# Patient Record
Sex: Male | Born: 1950 | Race: White | Hispanic: No | Marital: Single | Smoking: Former smoker
Health system: Southern US, Community
[De-identification: ages and names within clinical notes are randomized; demographics above are authoritative.]

## PROBLEM LIST (undated history)

## (undated) DIAGNOSIS — I10 Essential (primary) hypertension: Secondary | ICD-10-CM

## (undated) DIAGNOSIS — G8191 Hemiplegia, unspecified affecting right dominant side: Secondary | ICD-10-CM

## (undated) DIAGNOSIS — I619 Nontraumatic intracerebral hemorrhage, unspecified: Secondary | ICD-10-CM

## (undated) DIAGNOSIS — Z789 Other specified health status: Secondary | ICD-10-CM

## (undated) DIAGNOSIS — I69398 Other sequelae of cerebral infarction: Secondary | ICD-10-CM

## (undated) HISTORY — PX: NO PAST SURGERIES: SHX2092

---

## 2021-06-04 ENCOUNTER — Inpatient Hospital Stay (HOSPITAL_COMMUNITY): Payer: Medicare Other

## 2021-06-04 ENCOUNTER — Encounter (HOSPITAL_COMMUNITY): Payer: Self-pay | Admitting: Neurology

## 2021-06-04 ENCOUNTER — Emergency Department (HOSPITAL_COMMUNITY): Payer: Medicare Other

## 2021-06-04 ENCOUNTER — Other Ambulatory Visit: Payer: Self-pay

## 2021-06-04 ENCOUNTER — Inpatient Hospital Stay (HOSPITAL_COMMUNITY)
Admission: EM | Admit: 2021-06-04 | Discharge: 2021-06-18 | DRG: 064 | Disposition: A | Discharge status: Skilled Nursing Facility | Payer: Medicare Other | Attending: Internal Medicine | Admitting: Internal Medicine

## 2021-06-04 DIAGNOSIS — I611 Nontraumatic intracerebral hemorrhage in hemisphere, cortical: Principal | ICD-10-CM | POA: Diagnosis present

## 2021-06-04 DIAGNOSIS — E870 Hyperosmolality and hypernatremia: Secondary | ICD-10-CM | POA: Diagnosis not present

## 2021-06-04 DIAGNOSIS — B370 Candidal stomatitis: Secondary | ICD-10-CM | POA: Diagnosis not present

## 2021-06-04 DIAGNOSIS — J690 Pneumonitis due to inhalation of food and vomit: Secondary | ICD-10-CM | POA: Diagnosis present

## 2021-06-04 DIAGNOSIS — Z8249 Family history of ischemic heart disease and other diseases of the circulatory system: Secondary | ICD-10-CM

## 2021-06-04 DIAGNOSIS — N179 Acute kidney failure, unspecified: Secondary | ICD-10-CM | POA: Diagnosis present

## 2021-06-04 DIAGNOSIS — I161 Hypertensive emergency: Secondary | ICD-10-CM

## 2021-06-04 DIAGNOSIS — R4701 Aphasia: Secondary | ICD-10-CM | POA: Diagnosis present

## 2021-06-04 DIAGNOSIS — G8191 Hemiplegia, unspecified affecting right dominant side: Secondary | ICD-10-CM | POA: Diagnosis not present

## 2021-06-04 DIAGNOSIS — R1310 Dysphagia, unspecified: Secondary | ICD-10-CM | POA: Diagnosis present

## 2021-06-04 DIAGNOSIS — Z781 Physical restraint status: Secondary | ICD-10-CM

## 2021-06-04 DIAGNOSIS — R471 Dysarthria and anarthria: Secondary | ICD-10-CM | POA: Diagnosis present

## 2021-06-04 DIAGNOSIS — Z515 Encounter for palliative care: Secondary | ICD-10-CM | POA: Diagnosis not present

## 2021-06-04 DIAGNOSIS — E876 Hypokalemia: Secondary | ICD-10-CM | POA: Diagnosis not present

## 2021-06-04 DIAGNOSIS — I4892 Unspecified atrial flutter: Secondary | ICD-10-CM | POA: Diagnosis not present

## 2021-06-04 DIAGNOSIS — R202 Paresthesia of skin: Secondary | ICD-10-CM | POA: Diagnosis present

## 2021-06-04 DIAGNOSIS — I6389 Other cerebral infarction: Secondary | ICD-10-CM

## 2021-06-04 DIAGNOSIS — K5730 Diverticulosis of large intestine without perforation or abscess without bleeding: Secondary | ICD-10-CM | POA: Diagnosis present

## 2021-06-04 DIAGNOSIS — G935 Compression of brain: Secondary | ICD-10-CM | POA: Diagnosis present

## 2021-06-04 DIAGNOSIS — E43 Unspecified severe protein-calorie malnutrition: Secondary | ICD-10-CM | POA: Insufficient documentation

## 2021-06-04 DIAGNOSIS — Z8679 Personal history of other diseases of the circulatory system: Secondary | ICD-10-CM | POA: Diagnosis present

## 2021-06-04 DIAGNOSIS — I480 Paroxysmal atrial fibrillation: Secondary | ICD-10-CM

## 2021-06-04 DIAGNOSIS — I610 Nontraumatic intracerebral hemorrhage in hemisphere, subcortical: Secondary | ICD-10-CM | POA: Diagnosis not present

## 2021-06-04 DIAGNOSIS — I69391 Dysphagia following cerebral infarction: Secondary | ICD-10-CM | POA: Diagnosis not present

## 2021-06-04 DIAGNOSIS — I609 Nontraumatic subarachnoid hemorrhage, unspecified: Secondary | ICD-10-CM | POA: Diagnosis present

## 2021-06-04 DIAGNOSIS — K4090 Unilateral inguinal hernia, without obstruction or gangrene, not specified as recurrent: Secondary | ICD-10-CM | POA: Diagnosis present

## 2021-06-04 DIAGNOSIS — I619 Nontraumatic intracerebral hemorrhage, unspecified: Secondary | ICD-10-CM | POA: Diagnosis present

## 2021-06-04 DIAGNOSIS — Z7189 Other specified counseling: Secondary | ICD-10-CM | POA: Diagnosis not present

## 2021-06-04 DIAGNOSIS — I471 Supraventricular tachycardia: Secondary | ICD-10-CM | POA: Diagnosis not present

## 2021-06-04 DIAGNOSIS — I615 Nontraumatic intracerebral hemorrhage, intraventricular: Secondary | ICD-10-CM | POA: Diagnosis present

## 2021-06-04 DIAGNOSIS — I10 Essential (primary) hypertension: Secondary | ICD-10-CM | POA: Diagnosis present

## 2021-06-04 DIAGNOSIS — R29717 NIHSS score 17: Secondary | ICD-10-CM | POA: Diagnosis present

## 2021-06-04 DIAGNOSIS — R451 Restlessness and agitation: Secondary | ICD-10-CM | POA: Diagnosis present

## 2021-06-04 DIAGNOSIS — R200 Anesthesia of skin: Secondary | ICD-10-CM | POA: Diagnosis present

## 2021-06-04 DIAGNOSIS — L22 Diaper dermatitis: Secondary | ICD-10-CM | POA: Diagnosis not present

## 2021-06-04 DIAGNOSIS — E785 Hyperlipidemia, unspecified: Secondary | ICD-10-CM | POA: Diagnosis present

## 2021-06-04 DIAGNOSIS — R627 Adult failure to thrive: Secondary | ICD-10-CM | POA: Diagnosis not present

## 2021-06-04 DIAGNOSIS — L989 Disorder of the skin and subcutaneous tissue, unspecified: Secondary | ICD-10-CM | POA: Diagnosis not present

## 2021-06-04 DIAGNOSIS — I483 Typical atrial flutter: Secondary | ICD-10-CM | POA: Diagnosis not present

## 2021-06-04 DIAGNOSIS — G936 Cerebral edema: Secondary | ICD-10-CM | POA: Diagnosis present

## 2021-06-04 DIAGNOSIS — I4891 Unspecified atrial fibrillation: Secondary | ICD-10-CM | POA: Diagnosis not present

## 2021-06-04 HISTORY — DX: Other specified health status: Z78.9

## 2021-06-04 LAB — SODIUM
Sodium: 136 mmol/L (ref 135–145)
Sodium: 139 mmol/L (ref 135–145)

## 2021-06-04 LAB — APTT: aPTT: 26 seconds (ref 24–36)

## 2021-06-04 LAB — COMPREHENSIVE METABOLIC PANEL
ALT: 13 U/L (ref 0–44)
AST: 19 U/L (ref 15–41)
Albumin: 3.6 g/dL (ref 3.5–5.0)
Alkaline Phosphatase: 56 U/L (ref 38–126)
Anion gap: 7 (ref 5–15)
BUN: 13 mg/dL (ref 8–23)
CO2: 22 mmol/L (ref 22–32)
Calcium: 8.6 mg/dL — ABNORMAL LOW (ref 8.9–10.3)
Chloride: 110 mmol/L (ref 98–111)
Creatinine, Ser: 1 mg/dL (ref 0.61–1.24)
GFR, Estimated: >60 mL/min (ref >60)
Glucose, Bld: 120 mg/dL — ABNORMAL HIGH (ref 70–99)
Potassium: 3.8 mmol/L (ref 3.5–5.1)
Sodium: 139 mmol/L (ref 135–145)
Total Bilirubin: 0.5 mg/dL (ref 0.3–1.2)
Total Protein: 6.4 g/dL — ABNORMAL LOW (ref 6.5–8.1)

## 2021-06-04 LAB — I-STAT CHEM 8, ED
BUN: 14 mg/dL (ref 8–23)
Calcium, Ion: 1.09 mmol/L — ABNORMAL LOW (ref 1.15–1.40)
Chloride: 106 mmol/L (ref 98–111)
Creatinine, Ser: 0.9 mg/dL (ref 0.61–1.24)
Glucose, Bld: 120 mg/dL — ABNORMAL HIGH (ref 70–99)
HCT: 38 % — ABNORMAL LOW (ref 39.0–52.0)
Hemoglobin: 12.9 g/dL — ABNORMAL LOW (ref 13.0–17.0)
Potassium: 3.9 mmol/L (ref 3.5–5.1)
Sodium: 138 mmol/L (ref 135–145)
TCO2: 22 mmol/L (ref 22–32)

## 2021-06-04 LAB — CBC
HCT: 37 % — ABNORMAL LOW (ref 39.0–52.0)
Hemoglobin: 13 g/dL (ref 13.0–17.0)
MCH: 31.8 pg (ref 26.0–34.0)
MCHC: 35.1 g/dL (ref 30.0–36.0)
MCV: 90.5 fL (ref 80.0–100.0)
Platelets: 283 10*3/uL (ref 150–400)
RBC: 4.09 MIL/uL — ABNORMAL LOW (ref 4.22–5.81)
RDW: 13.4 % (ref 11.5–15.5)
WBC: 17.6 10*3/uL — ABNORMAL HIGH (ref 4.0–10.5)
nRBC: 0 % (ref 0.0–0.2)

## 2021-06-04 LAB — ECHOCARDIOGRAM COMPLETE
AR max vel: 2.46 cm2
AV Peak grad: 8.8 mmHg
Ao pk vel: 1.48 m/s
Area-P 1/2: 4.21 cm2
Calc EF: 62.3 %
P 1/2 time: 596 msec
S' Lateral: 2.8 cm
Single Plane A2C EF: 62.7 %
Single Plane A4C EF: 62.6 %
Weight: 2313.95 oz

## 2021-06-04 LAB — DIFFERENTIAL
Abs Immature Granulocytes: 0.06 10*3/uL (ref 0.00–0.07)
Basophils Absolute: 0.1 10*3/uL (ref 0.0–0.1)
Basophils Relative: 0 %
Eosinophils Absolute: 0.7 10*3/uL — ABNORMAL HIGH (ref 0.0–0.5)
Eosinophils Relative: 4 %
Immature Granulocytes: 0 %
Lymphocytes Relative: 9 %
Lymphs Abs: 1.6 10*3/uL (ref 0.7–4.0)
Monocytes Absolute: 0.9 10*3/uL (ref 0.1–1.0)
Monocytes Relative: 5 %
Neutro Abs: 14.3 10*3/uL — ABNORMAL HIGH (ref 1.7–7.7)
Neutrophils Relative %: 82 %

## 2021-06-04 LAB — LIPID PANEL
Cholesterol: 165 mg/dL (ref 0–200)
HDL: 38 mg/dL — ABNORMAL LOW (ref >40)
LDL Cholesterol: 118 mg/dL — ABNORMAL HIGH (ref 0–99)
Total CHOL/HDL Ratio: 4.3 RATIO
Triglycerides: 46 mg/dL (ref <150)
VLDL: 9 mg/dL (ref 0–40)

## 2021-06-04 LAB — PROTIME-INR
INR: 1 (ref 0.8–1.2)
Prothrombin Time: 13.6 seconds (ref 11.4–15.2)

## 2021-06-04 LAB — HEMOGLOBIN A1C
Hgb A1c MFr Bld: 5.6 % (ref 4.8–5.6)
Mean Plasma Glucose: 114.02 mg/dL

## 2021-06-04 LAB — CBG MONITORING, ED: Glucose-Capillary: 102 mg/dL — ABNORMAL HIGH (ref 70–99)

## 2021-06-04 MED ORDER — LEVETIRACETAM IN NACL 1500 MG/100ML IV SOLN
1500.0000 mg | Freq: Once | INTRAVENOUS | Status: AC
  Administered 2021-06-04: 1500 mg via INTRAVENOUS
  Filled 2021-06-04: qty 100

## 2021-06-04 MED ORDER — CHLORHEXIDINE GLUCONATE CLOTH 2 % EX PADS
6.0000 | MEDICATED_PAD | Freq: Every day | CUTANEOUS | Status: DC
  Administered 2021-06-05 – 2021-06-17 (×12): 6 via TOPICAL

## 2021-06-04 MED ORDER — LORAZEPAM 2 MG/ML IJ SOLN
0.5000 mg | Freq: Once | INTRAMUSCULAR | Status: AC
  Administered 2021-06-04: 0.5 mg via INTRAVENOUS
  Filled 2021-06-04: qty 1

## 2021-06-04 MED ORDER — CLEVIDIPINE BUTYRATE 0.5 MG/ML IV EMUL
0.0000 mg/h | INTRAVENOUS | Status: DC
  Administered 2021-06-04: 2 mg/h via INTRAVENOUS
  Administered 2021-06-05: 6 mg/h via INTRAVENOUS
  Administered 2021-06-05: 4 mg/h via INTRAVENOUS
  Administered 2021-06-05: 11 mg/h via INTRAVENOUS
  Administered 2021-06-05: 10 mg/h via INTRAVENOUS
  Administered 2021-06-06: 1 mg/h via INTRAVENOUS
  Administered 2021-06-07: 8 mg/h via INTRAVENOUS
  Administered 2021-06-07: 6 mg/h via INTRAVENOUS
  Filled 2021-06-04 (×9): qty 100

## 2021-06-04 MED ORDER — SENNOSIDES-DOCUSATE SODIUM 8.6-50 MG PO TABS
1.0000 | ORAL_TABLET | Freq: Two times a day (BID) | ORAL | Status: DC
  Administered 2021-06-06 – 2021-06-07 (×2): 1 via ORAL
  Filled 2021-06-04 (×3): qty 1

## 2021-06-04 MED ORDER — PANTOPRAZOLE SODIUM 40 MG IV SOLR
40.0000 mg | Freq: Every day | INTRAVENOUS | Status: DC
  Administered 2021-06-04 – 2021-06-07 (×5): 40 mg via INTRAVENOUS
  Filled 2021-06-04 (×5): qty 10

## 2021-06-04 MED ORDER — SODIUM CHLORIDE 3 % IV SOLN
INTRAVENOUS | Status: DC
  Filled 2021-06-04 (×7): qty 500

## 2021-06-04 MED ORDER — PERFLUTREN LIPID MICROSPHERE
1.0000 mL | INTRAVENOUS | Status: AC | PRN
  Administered 2021-06-04: 2 mL via INTRAVENOUS

## 2021-06-04 MED ORDER — GADOBUTROL 1 MMOL/ML IV SOLN
7.0000 mL | Freq: Once | INTRAVENOUS | Status: AC | PRN
  Administered 2021-06-04: 7 mL via INTRAVENOUS

## 2021-06-04 MED ORDER — LABETALOL HCL 5 MG/ML IV SOLN
10.0000 mg | INTRAVENOUS | Status: DC | PRN
  Administered 2021-06-05: 10 mg via INTRAVENOUS
  Administered 2021-06-05: 20 mg via INTRAVENOUS
  Filled 2021-06-04 (×2): qty 4

## 2021-06-04 MED ORDER — CLEVIDIPINE BUTYRATE 0.5 MG/ML IV EMUL
0.0000 mg/h | INTRAVENOUS | Status: AC
  Administered 2021-06-04: 6 mg/h via INTRAVENOUS
  Administered 2021-06-04: 2 mg/h via INTRAVENOUS
  Administered 2021-06-04: 8 mg/h via INTRAVENOUS
  Filled 2021-06-04 (×3): qty 50

## 2021-06-04 MED ORDER — ACETAMINOPHEN 160 MG/5ML PO SOLN
650.0000 mg | ORAL | Status: DC | PRN
  Administered 2021-06-09 – 2021-06-13 (×7): 650 mg
  Filled 2021-06-04 (×7): qty 20.3

## 2021-06-04 MED ORDER — LABETALOL HCL 5 MG/ML IV SOLN
20.0000 mg | Freq: Once | INTRAVENOUS | Status: AC
  Administered 2021-06-04: 20 mg via INTRAVENOUS

## 2021-06-04 MED ORDER — ACETAMINOPHEN 650 MG RE SUPP: 650.0000 mg | RECTAL | Status: DC | PRN

## 2021-06-04 MED ORDER — HYDRALAZINE HCL 20 MG/ML IJ SOLN
10.0000 mg | INTRAMUSCULAR | Status: DC | PRN
  Administered 2021-06-06 – 2021-06-08 (×3): 10 mg via INTRAVENOUS
  Filled 2021-06-04 (×3): qty 1

## 2021-06-04 MED ORDER — FOOD THICKENER (SIMPLYTHICK)
10.0000 | ORAL | Status: DC | PRN
  Filled 2021-06-04: qty 10

## 2021-06-04 MED ORDER — SODIUM CHLORIDE 0.9% FLUSH
3.0000 mL | Freq: Once | INTRAVENOUS | Status: AC
  Administered 2021-06-04: 3 mL via INTRAVENOUS

## 2021-06-04 MED ORDER — STROKE: EARLY STAGES OF RECOVERY BOOK
Freq: Once | Status: AC
  Filled 2021-06-04: qty 1

## 2021-06-04 MED ORDER — ROSUVASTATIN CALCIUM 20 MG PO TABS: 20.0000 mg | ORAL_TABLET | Freq: Every day | ORAL | Status: DC

## 2021-06-04 MED ORDER — ACETAMINOPHEN 325 MG PO TABS
650.0000 mg | ORAL_TABLET | ORAL | Status: DC | PRN
  Administered 2021-06-06: 650 mg via ORAL
  Filled 2021-06-04: qty 2

## 2021-06-04 NOTE — Progress Notes (Signed)
?  Transition of Care (TOC) Screening Note ? ? ?Patient Details  ?Name: Louis Moore ?Date of Birth: [birth year] ? ? ?Transition of Care [facility]) CM/SW Contact:    ?Glennon Mac, RN ?Phone Number: ?06/04/2021, 12:31 PM ? ? ? ?Transition of Care Department [facility]) has reviewed patient and no TOC needs have been identified at this time. We will continue to monitor patient advancement through interdisciplinary progression rounds. If new patient transition needs arise, please place a TOC consult. ? ?Quintella Baton, RN, BSN  ?Trauma/Neuro ICU Case Manager ?[phone]  ?

## 2021-06-04 NOTE — ED Notes (Signed)
Attempted report x 3.  

## 2021-06-04 NOTE — ED Notes (Signed)
Attempted to call report x2

## 2021-06-04 NOTE — TOC CAGE-AID Note (Signed)
Transition of Care (TOC) - CAGE-AID Screening ? ? ?Patient Details  ?Name: Pranshu Lyster ?MRN: [id number] ?Date of Birth: 1950-08-21 ? ?Transition of Care (TOC) CM/SW Contact:    ?Criss Bartles C Tarpley-Carter, LCSWA ?Phone Number: ?06/04/2021, 1:55 PM ? ? ?Clinical Narrative: ?Pt participated in Cage-Aid.  Pt stated he does not use substance or ETOH.  Pt was not offered resources, due to no usage of substance or ETOH.    ? ?Insurance underwriter, MSW, LCSW-A ?Pronouns:  She/Her/Hers ?[facility] ?Clinical Social Worker ?Direct Number:  [phone] ?[email or web address] ? ?CAGE-AID Screening: ?  ? ?Have You Ever Felt You Ought to Cut Down on Your Drinking or Drug Use?: No ?Have People Annoyed You By Critizing Your Drinking Or Drug Use?: No ?Have You Felt Bad Or Guilty About Your Drinking Or Drug Use?: No ?Have You Ever Had a Drink or Used Drugs First Thing In The Morning to Steady Your Nerves or to Get Rid of a Hangover?: No ?CAGE-AID Score: 0 ? ?Substance Abuse Education Offered: No ? ?  ? ? ? ? ? ? ?

## 2021-06-04 NOTE — ED Notes (Signed)
Attempted report x4

## 2021-06-04 NOTE — ED Notes (Signed)
Pt automatic BP's reading high d/t pt moving around in bed - Manual BP obtained at this time - within Cleviprex parameters  ?

## 2021-06-04 NOTE — Progress Notes (Signed)
Report received by [facility] [employer] [facility], staff in report ?

## 2021-06-04 NOTE — ED Provider Notes (Signed)
?[place] DEPT ?[facility] Emergency Department ?Provider Note ?MRN:  [id number]  ?Arrival date & time: 06/04/21    ? ?Chief Complaint   ?Code stroke ?History of Present Illness   ?Louis Moore is a 71 y.o. year-old male with no pertinent past medical history presenting to the ED with chief complaint of code stroke. ? ?Hand numbness earlier in the day followed by right-sided hemiplegia, code stroke initiated. ? ?Review of Systems  ?A thorough review of systems was obtained and all systems are negative except as noted in the HPI and PMH.  ? ?Patient's Health History   ? ?Past Medical History:  ?Diagnosis Date  ? Medical history non-contributory   ?  ?  ?History reviewed. No pertinent family history.  ?Social History  ? ?Socioeconomic History  ? Marital status: Single  ?  Spouse name: Not on file  ? Number of children: Not on file  ? Years of education: Not on file  ? Highest education level: Not on file  ?Occupational History  ? Not on file  ?Tobacco Use  ? Smoking status: Never  ? Smokeless tobacco: Never  ?Substance and Sexual Activity  ? Alcohol use: Never  ? Drug use: Never  ? Sexual activity: Not on file  ?Other Topics Concern  ? Not on file  ?Social History Narrative  ? Not on file  ? ?Social Determinants of Health  ? ?Financial Resource Strain: Not on file  ?Food Insecurity: Not on file  ?Transportation Needs: Not on file  ?Physical Activity: Not on file  ?Stress: Not on file  ?Social Connections: Not on file  ?Intimate Partner Violence: Not on file  ?  ? ?Physical Exam  ? ?Vitals:  ? 06/04/21 0522 06/04/21 0532  ?BP: (!) 144/68 (!) 144/68  ?Pulse: 91 89  ?Resp: 17 20  ?Temp:    ?SpO2: 95% 95%  ?  ?CONSTITUTIONAL: Chronically ill-appearing, NAD ?NEURO/PSYCH: Seems confused, right-sided weakness ?EYES:  eyes equal and reactive ?ENT/NECK:  no LAD, no JVD ?CARDIO: Regular rate, well-perfused, normal S1 and S2 ?PULM:  CTAB no wheezing or rhonchi ?GI/GU:  non-distended, non-tender ?MSK/SPINE:  No gross  deformities, no edema ?SKIN:  no rash, atraumatic ? ? ?*Additional and/or pertinent findings included in MDM below ? ?Diagnostic and Interventional Summary  ? ? EKG Interpretation ? ?Date/Time:  Friday June 04 2021 00:54:29 EDT ?Ventricular Rate:  78 ?PR Interval:  180 ?QRS Duration: 100 ?QT Interval:  384 ?QTC Calculation: 438 ?R Axis:   57 ?Text Interpretation: Sinus rhythm Confirmed by Gerlene Fee [id number]) on 06/04/2021 5:54:46 AM ?  ? ?  ? ?Labs Reviewed  ?CBC - Abnormal; Notable for the following components:  ?    Result Value  ? WBC 17.6 (*)   ? RBC 4.09 (*)   ? HCT 37.0 (*)   ? All other components within normal limits  ?DIFFERENTIAL - Abnormal; Notable for the following components:  ? Neutro Abs 14.3 (*)   ? Eosinophils Absolute 0.7 (*)   ? All other components within normal limits  ?COMPREHENSIVE METABOLIC PANEL - Abnormal; Notable for the following components:  ? Glucose, Bld 120 (*)   ? Calcium 8.6 (*)   ? Total Protein 6.4 (*)   ? All other components within normal limits  ?LIPID PANEL - Abnormal; Notable for the following components:  ? HDL 38 (*)   ? LDL Cholesterol 118 (*)   ? All other components within normal limits  ?I-STAT CHEM 8, ED - Abnormal; Notable for  the following components:  ? Glucose, Bld 120 (*)   ? Calcium, Ion 1.09 (*)   ? Hemoglobin 12.9 (*)   ? HCT 38.0 (*)   ? All other components within normal limits  ?CBG MONITORING, ED - Abnormal; Notable for the following components:  ? Glucose-Capillary 102 (*)   ? All other components within normal limits  ?PROTIME-INR  ?APTT  ?HEMOGLOBIN A1C  ?  ?CT HEAD WO CONTRAST (5MM)  ?Final Result  ?  ?CT HEAD CODE STROKE WO CONTRAST  ?Final Result  ?  ?MR BRAIN W WO CONTRAST    (Results Pending)  ?MR MRA HEAD WO CONTRAST    (Results Pending)  ?MR ANGIO NECK W WO CONTRAST    (Results Pending)  ?CT HEAD WO CONTRAST    (Results Pending)  ?  ?Medications  ?clevidipine (CLEVIPREX) infusion 0.5 mg/mL (6 mg/hr Intravenous New Bag/Given 06/04/21 0410)  ?  stroke: mapping our early stages of recovery book (has no administration in time range)  ?acetaminophen (TYLENOL) tablet 650 mg (has no administration in time range)  ?  Or  ?acetaminophen (TYLENOL) 160 MG/5ML solution 650 mg (has no administration in time range)  ?  Or  ?acetaminophen (TYLENOL) suppository 650 mg (has no administration in time range)  ?senna-docusate (Senokot-S) tablet 1 tablet (1 tablet Oral Not Given 06/04/21 0409)  ?pantoprazole (PROTONIX) injection 40 mg (40 mg Intravenous Given 06/04/21 0546)  ?sodium chloride flush (NS) 0.9 % injection 3 mL (3 mLs Intravenous Given 06/04/21 0409)  ?labetalol (NORMODYNE) injection 20 mg (20 mg Intravenous Given 06/04/21 0035)  ?levETIRAcetam (KEPPRA) IVPB 1500 mg/ 100 mL premix (0 mg Intravenous Stopped 06/04/21 0113)  ?  ? ?Procedures  /  Critical Care ?.Critical Care ?Performed by: Maudie Flakes, MD ?Authorized by: Maudie Flakes, MD  ? ?Critical care provider statement:  ?  Critical care time (minutes):  36 ?  Critical care was necessary to treat or prevent imminent or life-threatening deterioration of the following conditions: Hemorrhagic stroke. ?  Critical care was time spent personally by me on the following activities:  Development of treatment plan with patient or surrogate, discussions with consultants, evaluation of patient's response to treatment, examination of patient, ordering and review of laboratory studies, ordering and review of radiographic studies, ordering and performing treatments and interventions, pulse oximetry, re-evaluation of patient's condition and review of old charts ? ?ED Course and Medical Decision Making  ?Initial Impression and Ddx ?Acute hemiplegia, concern for stroke.  CT head confirms hemorrhagic stroke.  Admitted to the ICU.  Patient protecting airway. ? ?Past medical/surgical history that increases complexity of ED encounter: None ? ?Interpretation of Diagnostics ?I personally reviewed the EKG and my interpretation is as  follows: Sinus rhythm ?   ?Labs overall reassuring with no significant blood count or electrolyte disturbance noted on CBC/CMP ? ?Patient Reassessment and Ultimate Disposition/Management ?Admit to ICU ? ?Patient management required discussion with the following services or consulting groups:  Neurology ? ?Complexity of Problems Addressed ?Acute illness or injury that poses threat of life of bodily function ? ?Additional Data Reviewed and Analyzed ?Further history obtained from: ?Recent Consult notes ? ?Additional Factors Impacting ED Encounter Risk ?Consideration of hospitalization ? ?Barth Kirks. Sedonia Small, MD ?[facility] Emergency Medicine ?[place] [email or web address] ? ?Final Clinical Impressions(s) / ED Diagnoses  ? ?  ICD-10-CM   ?1. Hemorrhagic stroke ([place])  I61.9   ?  ?  ?ED Discharge Orders   ? ? None  ? ?  ?  ? ?  Discharge Instructions Discussed with and Provided to Patient:  ? ?Discharge Instructions   ?None ?  ? ?  ?Maudie Flakes, MD ?06/04/21 [phone] ? ?

## 2021-06-04 NOTE — Progress Notes (Addendum)
STROKE TEAM PROGRESS NOTE  ?INTERVAL HISTORY ?"Louis Moore is a 71 y.o. male with no significant PMH but has not seen a PCP in decades who went to bed around 2100 on 06/03/21 and had some numbness in his fingers on the right that started when he was in the bed. He woke up and was numb and plegic on the right and slipped from the bed but did not fall. Found to have a acute intraparenchymal hemorrhage centered at the high posterior left frontal lobe with associated small volume regional subarachnoid hemorrhage. Intraventricular extension with blood within the left greater than right lateral ventricles. Has mild associated mass effect with 55mm midline shift. No hydrocephalus, no trapping. He is not on any Anticoagulation or antiplatelets.  ?  ?Initial neuro consult exam with mild aphasia, RUE plegia along with RLE weakness and R sided sensory deficit and visual neglect vs hemianopsia. His ICH score is 1." ? ?Overnight patient's speech was more garbled and was agitated and BP elevated. Repeat CTH showed stable ICH.  ? ?Today, 12hrs MRI showed ICH stable without hydrocephalus or herniation. SLP recommended diet. A1c and lipid panel resulted. Statin added. Pending ECHO, MRA, and UDS.  ?He was awake but drowsy, only participated in exam with external stimuli. L sided soft restraint intact.  ? ?Vitals:  ? 06/04/21 0930 06/04/21 1000 06/04/21 1030 06/04/21 1100  ?BP: 118/60 136/72 (!) 127/56 130/60  ?Pulse: 64 81 64 66  ?Resp: 15 18    ?Temp:      ?TempSrc:      ?SpO2: 93% 96% 95% 97%  ?Weight:      ? ?CBC:  ?Recent Labs  ?Lab 06/04/21 ?0028 06/04/21 ?0034  ?WBC 17.6*  --   ?NEUTROABS 14.3*  --   ?HGB 13.0 12.9*  ?HCT 37.0* 38.0*  ?MCV 90.5  --   ?PLT 283  --   ? ?Basic Metabolic Panel:  ?Recent Labs  ?Lab 06/04/21 ?0028 06/04/21 ?0034 06/04/21 ?1206  ?NA 139 138 136  ?K 3.8 3.9  --   ?CL 110 106  --   ?CO2 22  --   --   ?GLUCOSE 120* 120*  --   ?BUN 13 14  --   ?CREATININE 1.00 0.90  --   ?CALCIUM 8.6*  --   --   ? ?Lipid  Panel:  ?Recent Labs  ?Lab 06/04/21 ?0028  ?CHOL 165  ?TRIG 46  ?HDL 38*  ?CHOLHDL 4.3  ?VLDL 9  ?LDLCALC 118*  ? ?HgbA1c:  ?Recent Labs  ?Lab 06/04/21 ?0028  ?HGBA1C 5.6  ? ?Urine Drug Screen:  ?No results for input(s): LABOPIA, COCAINSCRNUR, LABBENZ, AMPHETMU, THCU, LABBARB in the last 168 hours. ?Alcohol Level No results for input(s): ETH in the last 168 hours. ? ?IMAGING past 24 hours ?CT HEAD WO CONTRAST ? ?Result Date: 06/04/2021 ?CLINICAL DATA:  Stroke, hemorrhagic.  Stability scan at 6 hours. EXAM: CT HEAD WITHOUT CONTRAST TECHNIQUE: Contiguous axial images were obtained from the base of the skull through the vertex without intravenous contrast. RADIATION DOSE REDUCTION: This exam was performed according to the departmental dose-optimization program which includes automated exposure control, adjustment of the mA and/or kV according to patient size and/or use of iterative reconstruction technique. COMPARISON:  CT examination dated June 04, 2021 at 3:49 a.m. beta FINDINGS: Brain: Redemonstration of the left high frontal parenchymal hemorrhage with marked surrounding vasogenic edema the hemorrhage measures approximately 4.8 x 2.9 x 3.5 cm, corresponding to a volume of 26 mL, not significantly changed from  prior examination. Persistent surrounding edema with mild increase and leftward with left ward midline shift measuring approximately 4 mm. Small adjacent subarachnoid hemorrhage is unchanged. The ventricles are unchanged in size. No hydrocephalus. Vascular: No hyperdense vessel or unexpected calcification. Skull: Normal. Negative for fracture or focal lesion. Sinuses/Orbits: No acute finding. Other: None. IMPRESSION: 1. Redemonstration of large left high frontal parenchymal hemorrhage, not significantly changed from prior examination. Mild interval increase in surrounding edema with now approximately 4 mm leftward midline shift. 2.  No new hemorrhage. Electronically Signed   By: Keane Police D.O.   On:  06/04/2021 09:25  ? ?CT HEAD WO CONTRAST (5MM) ? ?Result Date: 06/04/2021 ?CLINICAL DATA:  Hemorrhagic stroke, follow-up. Worsening aphasia, agitation. EXAM: CT HEAD WITHOUT CONTRAST TECHNIQUE: Contiguous axial images were obtained from the base of the skull through the vertex without intravenous contrast. RADIATION DOSE REDUCTION: This exam was performed according to the departmental dose-optimization program which includes automated exposure control, adjustment of the mA and/or kV according to patient size and/or use of iterative reconstruction technique. COMPARISON:  06/04/2021. FINDINGS: Brain: Examination is limited due to motion artifact. There is an acute hemorrhage in the posterior frontal lobe on the left measuring 5.1 x 3.4 x 3.3 cm, estimated total volume 32.2 mL. A trace subarachnoid hemorrhages noted adjacent to this on the left. Surrounding vasogenic edema is noted. There is mild surrounding mass effect with effacement of the posterior horn of the left lateral ventricle. There is mild mediastinal shift to the right of approximally 2 mm at the level of the septum pellucidum. No hydrocephalus. Vascular: No hyperdense vessel or unexpected calcification. Skull: Normal. Negative for fracture or focal lesion. Sinuses/Orbits: Mucosal thickening is present in the ethmoid air cells. No acute orbital abnormality. Other: None. IMPRESSION: 1. Limited evaluation due to motion artifact. Intracranial hemorrhage centered at the posterior frontal lobe on the left, slightly increased in size from the prior exam. There is surrounding vasogenic edema with mass effect and mild midline shift to the right of approximally 2 mm. 2. Stable small subarachnoid hemorrhage on the left. Electronically Signed   By: Brett Fairy M.D.   On: 06/04/2021 04:24  ? ?MR MRA HEAD WO CONTRAST ? ?Result Date: 06/04/2021 ?CLINICAL DATA:  Hemorrhagic stroke, brain metastases suspected EXAM: MRI HEAD WITHOUT AND WITH CONTRAST MRA HEAD WITHOUT  CONTRAST MRA NECK WITHOUT AND WITH CONTRAST TECHNIQUE: Multiplanar, multi-echo pulse sequences of the brain and surrounding structures were acquired without and with intravenous contrast. Angiographic images of the Circle of Willis were acquired using MRA technique without intravenous contrast. Angiographic images of the neck were acquired using MRA technique without and with intravenous contrast. Carotid stenosis measurements (when applicable) are obtained utilizing NASCET criteria, using the distal internal carotid diameter as the denominator. CONTRAST:  91mL GADAVIST GADOBUTROL 1 MMOL/ML IV SOLN COMPARISON:  No prior MRI correlation is made with CT head 06/04/2021 FINDINGS: Evaluation is limited by motion artifact. MRI HEAD FINDINGS Brain: Redemonstrated intraparenchymal hemorrhage in the left frontal and parietal lobes, which measures approximately 4.5 x 3.6 x 4.5 cm (series 6, image 18 and series 11, image 14), previously 4.5 x 3.6 x 4.3 cm cm when remeasured similarly, overall unchanged. Again noted is adjacent subarachnoid hemorrhage and moderate surrounding edema. Effacement of the surrounding sulci, with mass effect on the superior aspect of the left lateral ventricle and approximately 6 mm of left-to-right midline shift at the level of the septum pellucidum, unchanged when remeasured similarly. Hemorrhage is noted within the left  greater than right lateral ventricle. No definite hydrocephalus. Evaluation on postcontrast imaging is limited by motion, however there appears to be small focus of enhancement within the hemorrhage (series 15, image 20), concerning for minimal active extravasation. No other abnormal parenchymal or meningeal enhancement. Increased signal on diffusion-weighted imaging is seen surrounding the hematoma, without distant restricted diffusion to suggest additional acute infarct. The basal cisterns are patent. Vascular: Please see MRA findings below. Skull and upper cervical spine: Normal  marrow signal. Sinuses/Orbits: Mucosal thickening in the ethmoid air cells. Otherwise unremarkable. Other: The mastoids are well aerated. MRA HEAD FINDINGS Anterior circulation: Both internal carotid arteries ar

## 2021-06-04 NOTE — Evaluation (Signed)
Clinical/Bedside Swallow Evaluation ?Patient Details  ?Name: Louis Moore ?MRN: [id number] ?Date of Birth: Jul 28, 1950 ? ?Today's Date: 06/04/2021 ?Time: SLP Start Time (ACUTE ONLY): 0930 SLP Stop Time (ACUTE ONLY): 0950 ?SLP Time Calculation (min) (ACUTE ONLY): 20 min ? ?Past Medical History:  ?Past Medical History:  ?Diagnosis Date  ? Medical history non-contributory   ? ?Past Surgical History:  ?Past Surgical History:  ?Procedure Laterality Date  ? NO PAST SURGERIES    ? ?HPI:  ?71 y.o. male with no significant PMH but has not seen a PCP in decades admitted with aphasia and right sided weakness. Dx acute Intraparenchymal hemorrhage in the high L posterior frontal lobe with associated cerebral edema, mass effect, midline shift and brain compression.  ?  ?Assessment / Plan / Recommendation  ?Clinical Impression ? 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 He coughed intermittently with self-administered sips of water; coughing was eliminated with nectar thick liquids.  Mastication was functional with no obvious pocketing on right side.Recommend initiating a dysphagia 3 diet with chopped meats; nectar thick liquids. Meds whole in nectars. SLP will follow for diet progression and safety. ?SLP Visit Diagnosis: Dysphagia, oropharyngeal phase (R13.12) ?   ?Aspiration Risk ?   tba ?  ?Diet Recommendation   Dysphagia 3 with chopped meats; nectar thick liquids ? ?Medication Administration: Whole meds with liquid  ?  ?Other  Recommendations Oral Care Recommendations: Oral care BID ?Other Recommendations: Order thickener from pharmacy   ? ?Recommendations for follow up therapy are one component of a multi-disciplinary discharge planning process, led by the attending physician.  Recommendations may be updated based on patient status, additional functional criteria and insurance  authorization. ? ?Follow up Recommendations Other (comment) (tba)  ? ? ?  ?Assistance Recommended at Discharge Frequent or constant Supervision/Assistance  ?Functional Status Assessment Patient has had a recent decline in their functional status and demonstrates the ability to make significant improvements in function in a reasonable and predictable amount of time.  ?Frequency and Duration min 2x/week  ?2 weeks ?  ?   ? ?Prognosis Prognosis for Safe Diet Advancement: Good  ? ?  ? ?Swallow Study   ?General Date of Onset: 06/04/21 ?HPI: 71 y.o. male with no significant PMH but has not seen a PCP in decades admitted with aphasia and right sided weakness. Dx acute Intraparenchymal hemorrhage in the high L posterior frontal lobe with associated cerebral edema, mass effect, midline shift and brain compression. ?Type of Study: Bedside Swallow Evaluation ?Previous Swallow Assessment: no ?Diet Prior to this Study: NPO ?Temperature Spikes Noted: No ?Respiratory Status: Room air ?History of Recent Intubation: No ?Behavior/Cognition: Alert ?Oral Cavity Assessment: Within Functional Limits ?Oral Care Completed by SLP: Yes ?Oral Cavity - Dentition: Edentulous ?Vision: Impaired for self-feeding ?Self-Feeding Abilities: Able to feed self;Needs assist ?Patient Positioning: Upright in bed ?Baseline Vocal Quality: Normal ?Volitional Cough: Strong ?Volitional Swallow: Able to elicit  ?  ?Oral/Motor/Sensory Function Overall Oral Motor/Sensory Function: Within functional limits   ?Ice Chips Ice chips: Within functional limits   ?Thin Liquid Thin Liquid: Impaired ?Presentation: Cup;Straw ?Oral Phase Functional Implications: Right anterior spillage ?Pharyngeal  Phase Impairments: Suspected delayed Swallow;Cough - Immediate  ?  ?Nectar Thick Nectar Thick Liquid: Impaired ?Presentation: Cup ?Oral phase functional implications: Right anterior spillage   ?Honey Thick Honey Thick Liquid: Not tested   ?Puree Puree: Impaired ?Presentation: Self  Fed;Spoon ?Oral Phase  Functional Implications: Right anterior spillage   ?Solid ? ? ?     ? ?  ? ?[name] ?06/04/2021,10:24 AM ? ? ?Norwood Quezada L. Pinchos Topel, MA CCC/SLP ?Acute Rehabilitation Services ?Office number [phone] ?Pager [phone] ? ?

## 2021-06-04 NOTE — H&P (Signed)
NEUROLOGY CONSULTATION NOTE  ? ?Date of service: June 04, 2021 ?Patient Name: Louis Moore ?MRN:  [id number] ?DOB:  September 06, 1950 ? ?_ _ _   _ __   _ __ _ _  __ __   _ __   __ _ ? ?History of Present Illness  ?Louis Moore is a 71 y.o. male with no significant PMH but has not seen a PCP in decades who went to bed around 2100 on 06/03/21 and had some numbness in his fingers on the right that started when he was in the bed. He woke up and was numb and plegic on the right and slipped from the bed but did not fall. ? ?He does not take any meds. Not on any AC or antiplatelets. ? ?His brother called EMS and he was brought in as a code stroke. Per EMS, SBP was in 190s initially. ? ?Family at bedside reports that he was complaining of R hand numbness earlier in the day but patient is not entirely sure. ? ?He was noted to have a couple jerks on his RLE that were arhythmic and was loaded with Keppra. ? ?LKW: Presumed to be 2100 on 06/03/21. ?mRS: 0 ?tNKASE: not a candidate 2/2 ICH ?Thrombectomy: not a candidate 2/2 ICH ?ICH score: 1 ?NIHSS components Score: Comment  ?1a Level of Conscious 0[x]  1[]  2[]  3[]      ?1b LOC Questions 0[x]  1[]  2[]       ?1c LOC Commands 0[x]  1[]  2[]       ?2 Best Gaze 0[x]  1[]  2[]       ?3 Visual 0[]  1[]  2[x]  3[]      ?4 Facial Palsy 0[x]  1[]  2[]  3[]      ?5a Motor Arm - left 0[x]  1[]  2[]  3[]  4[]  UN[]    ?5b Motor Arm - Right 0[]  1[]  2[]  3[]  4[x]  UN[]    ?6a Motor Leg - Left 0[x]  1[]  2[]  3[]  4[]  UN[]    ?6b Motor Leg - Right 0[]  1[]  2[]  3[x]  4[]  UN[]    ?7 Limb Ataxia 0[x]  1[]  2[]  3[]  UN[]     ?8 Sensory 0[]  1[]  2[x]  UN[]      ?9 Best Language 0[]  1[x]  2[]  3[]      ?10 Dysarthria 0[x]  1[]  2[]  UN[]      ?11 Extinct. and Inattention 0[]  1[x]  2[]       ?TOTAL: 13   ?  ?ROS  ? ?Constitutional Denies weight loss, fever and chills.   ?HEENT Denies changes in vision and hearing.   ?Respiratory Denies SOB and cough.   ?CV Denies palpitations and CP   ?GI Denies abdominal pain, nausea, vomiting and diarrhea.   ?GU Denies  dysuria and urinary frequency.   ?MSK Denies myalgia and joint pain.   ?Skin Denies rash and pruritus.   ?Neurological Denies headache and syncope.   ?Psychiatric Denies recent changes in mood. Denies anxiety and depression.   ? ?Past History  ? ?Past Medical History:  ?Diagnosis Date  ?? Medical history non-contributory   ? ?Past Surgical History:  ?Procedure Laterality Date  ?? NO PAST SURGERIES    ? ?History reviewed. No pertinent family history. ?Social History  ? ?Socioeconomic History  ?? Marital status: Not on file  ?  Spouse name: Not on file  ?? Number of children: Not on file  ?? Years of education: Not on file  ?? Highest education level: Not on file  ?Occupational History  ?? Not on file  ?Tobacco Use  ?? Smoking status: Never  ?? Smokeless tobacco: Never  ?Substance and  Sexual Activity  ?? Alcohol use: Never  ?? Drug use: Never  ?? Sexual activity: Not on file  ?Other Topics Concern  ?? Not on file  ?Social History Narrative  ?? Not on file  ? ?Social Determinants of Health  ? ?Financial Resource Strain: Not on file  ?Food Insecurity: Not on file  ?Transportation Needs: Not on file  ?Physical Activity: Not on file  ?Stress: Not on file  ?Social Connections: Not on file  ? ?No Known Allergies ? ?Medications  ?(Not in a hospital admission) ?  ? ?Vitals  ? ?Vitals:  ? 06/04/21 0100 06/04/21 0105 06/04/21 0110 06/04/21 0115  ?BP: (!) 123/44 (!) 114/50 (!) 148/67 (!) 162/77  ?Pulse: 78 78 78 79  ?Resp: (!) 24 17 19 17   ?Temp:      ?TempSrc:      ?SpO2: 95% 95% 95% 95%  ?Weight:      ?  ? ?There is no height or weight on file to calculate BMI. ? ?Physical Exam  ? ?General: Laying comfortably in bed; in no acute distress.  ?HENT: Normal oropharynx and mucosa. Normal external appearance of ears and nose.  ?Neck: Supple, no pain or tenderness  ?CV: No JVD. No peripheral edema.  ?Pulmonary: Symmetric Chest rise. Normal respiratory effort.  ?Abdomen: Soft to touch, non-tender.  ?Ext: No cyanosis, edema, or  deformity  ?Skin: No rash. Normal palpation of skin.   ?Musculoskeletal: Normal digits and nails by inspection. No clubbing.  ? ?Neurologic Examination  ?Mental status/Cognition: Alert, oriented to self, place, month and year, good attention.  ?Speech/language: hesitant and non fluent, comprehension intact, able to name most objects, noted to have some aphasia during conversation with getting stuck on several words, repetition intact.  ?Cranial nerves:  ? CN II Pupils equal and reactive to light, R hemianopsia vs visual neglect  ? CN III,IV,VI EOM intact, no gaze preference or deviation, no nystagmus  ? CN V normal sensation in V1, V2, and V3 segments bilaterally  ? CN VII no asymmetry, no nasolabial fold flattening  ? CN VIII normal hearing to speech  ? CN IX & X normal palatal elevation, no uvular deviation  ? CN XI 5/5 head turn and 5/5 shoulder shrug bilaterally  ? CN XII midline tongue protrusion  ? ?Motor:  ?Muscle bulk: poor, tone flaccid in RUE. ?Mvmt Root Nerve  Muscle Right Left Comments  ?SA C5/6 Ax Deltoid 0 5   ?EF C5/6 Mc Biceps 0 5   ?EE C6/7/8 Rad Triceps 0 5   ?WF C6/7 Med FCR     ?WE C7/8 PIN ECU     ?F Ab C8/T1 U ADM/FDI 0 5   ?HF L1/2/3 Fem Illopsoas 3 5   ?KE L2/3/4 Fem Quad 2 5   ?DF L4/5 D Peron Tib Ant 2 5   ?PF S1/2 Tibial Grc/Sol 2 5   ? ?Reflexes: ? Right Left Comments  ?Pectoralis     ? Biceps (C5/6) 1 1   ?Brachioradialis (C5/6) 1 1   ? Triceps (C6/7) 1 1   ? Patellar (L3/4) 1 1   ? Achilles (S1)     ? Hoffman     ? Plantar     ?Jaw jerk   ? ?Sensation: ? Light touch Absent in RUE and RLE.  ? Pin prick   ? Temperature   ? Vibration   ?Proprioception   ? ?Coordination/Complex Motor:  ?- Finger to Nose intact in LUE ?- Heel to shin unable  to do ?- Rapid alternating movement are intact in LUE ?- Gait: unsafe to assess. ? ?Labs  ? ?CBC:  ?Recent Labs  ?Lab 06/04/21 ?0028 06/04/21 ?0034  ?WBC 17.6*  --   ?NEUTROABS 14.3*  --   ?HGB 13.0 12.9*  ?HCT 37.0* 38.0*  ?MCV 90.5  --   ?PLT 283  --    ? ? ?Basic Metabolic Panel:  ?Lab Results  ?Component Value Date  ? NA 138 06/04/2021  ? K 3.9 06/04/2021  ? CO2 22 06/04/2021  ? GLUCOSE 120 (H) 06/04/2021  ? BUN 14 06/04/2021  ? CREATININE 0.90 06/04/2021  ? CALCIUM 8.6 (L) 06/04/2021  ? GFRNONAA >60 06/04/2021  ? ?Lipid Panel: No results found for: [place] ?HgbA1c: No results found for: HGBA1C ?Urine Drug Screen: No results found for: LABOPIA, COCAINSCRNUR, [place], [place], THCU, LABBARB  ?Alcohol Level No results found for: ETH ? ?CT Head without contrast(Personally reviewed): ?1. 4.0 x 3.2 x 4.5 cm (estimated volume 29 mL) acute ?intraparenchymal hemorrhage centered at the high posterior left ?frontal lobe with associated small volume regional subarachnoid ?hemorrhage. Intraventricular extension with blood within the left ?greater than right lateral ventricles. ?2. Associated mass effect with mild 3 mm left-to-right shift. No ?hydrocephalus or trapping. ? ?MR Angio head without contrast and MR Angio neck with contrast: ?pending ? ?MRI Brain: ?pending ? ?Impression  ? ?Louis Moore is a 71 y.o. male with no significant PMH but has not seen a PCP in decades who went to bed around 2100 on 06/03/21 and had some numbness in his fingers on the right that started when he was in the bed. He woke up and was numb and plegic on the right and slipped from the bed but did not fall. Found to have a acute intraparenchymal hemorrhage centered at the high posterior left frontal lobe with associated small volume regional subarachnoid hemorrhage. Intraventricular extension with blood within the left greater than right lateral ventricles. Has mild associated mass effect with 44mm midline shift. No hydrocephalus, no trapping. He is not on any Anticoagulation or antiplatelets. ? ?Exam with mild aphasia, RUE plegia along with RLE weakness and R sided sensory deficit and visual neglect vs hemianopsia. His ICH score is 1. ? ?Will need further workup to identify the etiology of the  hemorrhage, he is in hypertensive emergency here and will be admitted to ICU to closely follow exam, strict BP control. ? ?Impression: ?Acute Intraparenchymal hemorrhage in the high L posterior frontal lobe with a

## 2021-06-04 NOTE — ED Notes (Signed)
Patient agitated and attempting to leave bed upon this RNs arrival to shift at 0300. Dr. Ezzie Dural updated by Fredricka Bonine RN and CT ordered. Patient moved to room next to nurse's station and placed on hospital bed with bed alarm turned on. Soft mittens applied due to agitation and patient pulling at equipment and IV lines. Patient taken CT by this RN and ED tech Joselyn Glassman. Dr. Ezzie Dural present in CT to assess patient. Patient returned to ED room following scans. Patient still restless and agitated at this time.  ?

## 2021-06-04 NOTE — Evaluation (Signed)
Speech Language Pathology Evaluation ?Patient Details ?Name: Louis Moore ?MRN: [id number] ?DOB: 1951/01/14 ?Today's Date: 06/04/2021 ?Time: 8309-4076 ?SLP Time Calculation (min) (ACUTE ONLY): 25 min ? ?Problem List:  ?Patient Active Problem List  ? Diagnosis Date Noted  ? ICH (intracerebral hemorrhage) (HCC) 06/04/2021  ? ?Past Medical History:  ?Past Medical History:  ?Diagnosis Date  ? Medical history non-contributory   ? ?Past Surgical History:  ?Past Surgical History:  ?Procedure Laterality Date  ? NO PAST SURGERIES    ? ?HPI:  ?71 y.o. male with no significant PMH but has not seen a PCP in decades admitted with aphasia and right sided weakness. Dx acute Intraparenchymal hemorrhage in the high L posterior frontal lobe with associated cerebral edema, mass effect, midline shift and brain compression.  ? ?Assessment / Plan / Recommendation ?Clinical Impression ? Pt presents with an expressive > receptive aphasia.  Spontaneous speech improved as session progressed.  Confrontational naming WFL; responsive naming 75%; repetition of words intact but had difficulty repeating phrases. Automatic speech tasks (days of week, counting to ten) were marked by phonemic paraphasias and perseverative errors. Propositional speech was dysfluent and effortful, with multiple phonemic paraphasias evident.  Comprehension of social conversation and simple commands WFL; comprehension broke down as complexity/novelty increased. Pt will benefit from SLP f/u while admitted to address the aforementioned deficits. He may benefit from AIR stay; will await OT/PT evals. ?   ?SLP Assessment ? SLP Recommendation/Assessment: Patient needs continued Speech Lanaguage Pathology Services ?SLP Visit Diagnosis: Aphasia (R47.01)  ?  ?Recommendations for follow up therapy are one component of a multi-disciplinary discharge planning process, led by the attending physician.  Recommendations may be updated based on patient status, additional functional  criteria and insurance authorization. ?   ?Follow Up Recommendations ? Other (comment) (tba)  ?  ?Assistance Recommended at Discharge ? Frequent or constant Supervision/Assistance  ?Functional Status Assessment Patient has had a recent decline in their functional status and demonstrates the ability to make significant improvements in function in a reasonable and predictable amount of time.  ?Frequency and Duration min 2x/week  ?2 weeks ?  ?   ?SLP Evaluation ?Cognition ? Overall Cognitive Status: Impaired/Different from baseline ?Orientation Level: Oriented to person;Oriented to place;Disoriented to time;Disoriented to situation  ?  ?   ?Comprehension ? Auditory Comprehension ?Overall Auditory Comprehension: Impaired ?Yes/No Questions: Within Functional Limits (for biographical information) ?Commands: Impaired ?One Step Basic Commands: 75-100% accurate ?Two Step Basic Commands: 50-74% accurate ?Conversation: Simple ?Reading Comprehension ?Reading Status: Not tested  ?  ?Expression Expression ?Primary Mode of Expression: Verbal ?Verbal Expression ?Overall Verbal Expression: Impaired ?Initiation: No impairment ?Automatic Speech:  (able to recite initial three words of sequences) ?Level of Generative/Spontaneous Verbalization: Phrase ?Repetition: Impaired ?Level of Impairment: Phrase level ?Naming: Impairment ?Responsive: 76-100% accurate ?Confrontation: Within functional limits ?Verbal Errors: Phonemic paraphasias;Perseveration   ?Oral / Motor ? Oral Motor/Sensory Function ?Overall Oral Motor/Sensory Function: Within functional limits ?Motor Speech ?Overall Motor Speech: Impaired ?Respiration: Within functional limits ?Phonation: Normal ?Resonance: Within functional limits ?Articulation: Impaired ?Level of Impairment: Word ?Intelligibility: Intelligibility reduced ?Phrase: 75-100% accurate ?Motor Planning: Impaired ?Level of Impairment: Word   ?        ? ?[name] ?06/04/2021, 10:34 AM ?[place].  Kisa Fujii, MA CCC/SLP ?Acute Rehabilitation Services ?Office number [phone] ?Pager [phone] ? ?

## 2021-06-04 NOTE — ED Notes (Signed)
Attempted to call report x 1  

## 2021-06-04 NOTE — Progress Notes (Signed)
Was notified that his speech is more garbled and he is more agitated. BP has been under control, attempting to pull on his gown. ? ?Will get a STAT CTH to ensure no worsening of ICH. ? ?Donnetta Simpers ?[facility] ?Pager Number [phone] ? ?

## 2021-06-05 ENCOUNTER — Inpatient Hospital Stay (HOSPITAL_COMMUNITY): Payer: Medicare Other

## 2021-06-05 DIAGNOSIS — I610 Nontraumatic intracerebral hemorrhage in hemisphere, subcortical: Secondary | ICD-10-CM | POA: Diagnosis not present

## 2021-06-05 LAB — SODIUM
Sodium: 141 mmol/L (ref 135–145)
Sodium: 143 mmol/L (ref 135–145)
Sodium: 146 mmol/L — ABNORMAL HIGH (ref 135–145)
Sodium: 149 mmol/L — ABNORMAL HIGH (ref 135–145)

## 2021-06-05 LAB — BASIC METABOLIC PANEL
Anion gap: 9 (ref 5–15)
BUN: 16 mg/dL (ref 8–23)
CO2: 18 mmol/L — ABNORMAL LOW (ref 22–32)
Calcium: 8.6 mg/dL — ABNORMAL LOW (ref 8.9–10.3)
Chloride: 116 mmol/L — ABNORMAL HIGH (ref 98–111)
Creatinine, Ser: 1.02 mg/dL (ref 0.61–1.24)
GFR, Estimated: >60 mL/min (ref >60)
Glucose, Bld: 99 mg/dL (ref 70–99)
Potassium: 3.9 mmol/L (ref 3.5–5.1)
Sodium: 143 mmol/L (ref 135–145)

## 2021-06-05 LAB — CBC
HCT: 37.7 % — ABNORMAL LOW (ref 39.0–52.0)
Hemoglobin: 13 g/dL (ref 13.0–17.0)
MCH: 31.1 pg (ref 26.0–34.0)
MCHC: 34.5 g/dL (ref 30.0–36.0)
MCV: 90.2 fL (ref 80.0–100.0)
Platelets: 322 10*3/uL (ref 150–400)
RBC: 4.18 MIL/uL — ABNORMAL LOW (ref 4.22–5.81)
RDW: 13.9 % (ref 11.5–15.5)
WBC: 18 10*3/uL — ABNORMAL HIGH (ref 4.0–10.5)
nRBC: 0 % (ref 0.0–0.2)

## 2021-06-05 LAB — RAPID URINE DRUG SCREEN, HOSP PERFORMED
Amphetamines: NOT DETECTED
Barbiturates: NOT DETECTED
Benzodiazepines: NOT DETECTED
Cocaine: NOT DETECTED
Opiates: NOT DETECTED
Tetrahydrocannabinol: NOT DETECTED

## 2021-06-05 LAB — MRSA NEXT GEN BY PCR, NASAL: MRSA by PCR Next Gen: NOT DETECTED

## 2021-06-05 MED ORDER — DEXMEDETOMIDINE HCL IN NACL 400 MCG/100ML IV SOLN
0.4000 ug/kg/h | INTRAVENOUS | Status: DC
  Administered 2021-06-05: 0.8 ug/kg/h via INTRAVENOUS
  Administered 2021-06-05: 0.4 ug/kg/h via INTRAVENOUS
  Filled 2021-06-05 (×2): qty 100

## 2021-06-05 MED ORDER — METOPROLOL TARTRATE 25 MG PO TABS
25.0000 mg | ORAL_TABLET | Freq: Two times a day (BID) | ORAL | Status: DC
  Administered 2021-06-05: 25 mg via ORAL
  Filled 2021-06-05 (×2): qty 1

## 2021-06-05 MED ORDER — HALOPERIDOL LACTATE 5 MG/ML IJ SOLN
2.0000 mg | Freq: Once | INTRAMUSCULAR | Status: AC
  Administered 2021-06-05: 2 mg via INTRAVENOUS
  Filled 2021-06-05: qty 1

## 2021-06-05 MED ORDER — CLONAZEPAM 0.25 MG PO TBDP
0.5000 mg | ORAL_TABLET | Freq: Once | ORAL | Status: AC
  Administered 2021-06-05: 0.5 mg via ORAL
  Filled 2021-06-05: qty 2

## 2021-06-05 MED ORDER — HALOPERIDOL 5 MG PO TABS
5.0000 mg | ORAL_TABLET | Freq: Once | ORAL | Status: AC
  Administered 2021-06-05: 5 mg via ORAL
  Filled 2021-06-05: qty 1

## 2021-06-05 MED ORDER — QUETIAPINE FUMARATE 25 MG PO TABS
25.0000 mg | ORAL_TABLET | Freq: Four times a day (QID) | ORAL | Status: DC | PRN
  Administered 2021-06-05: 25 mg via ORAL
  Filled 2021-06-05: qty 1

## 2021-06-05 NOTE — Progress Notes (Signed)
PT Cancellation Note ? ?Patient Details ?Name: Louis Moore ?MRN: [id number] ?DOB: Dec 26, 1950 ? ? ?Cancelled Treatment:    Reason Eval/Treat Not Completed: Active bedrest order;Patient not medically ready. MD requesting hold on therapies today. Will plan to follow-up another day as appropriate. ? ? ?Raymond Gurney, PT, DPT ?Acute Rehabilitation Services  ?Pager: [phone] ?Office: [phone] ? ? ? ?Henrene Dodge Pettis ?06/05/2021, 8:49 AM ? ? ?

## 2021-06-05 NOTE — Progress Notes (Signed)
Patient ID: Louis Moore, male   DOB: [birth year], 71 y.o.   MRN: [id number] ? ?Came by to evaluate pt. Still agitated despite clonidine, seroquel and haldol trial. Started precedex ggt low dose. Discussed with ICU nurse.  ?

## 2021-06-05 NOTE — Progress Notes (Addendum)
STROKE TEAM PROGRESS NOTE  ?INTERVAL HISTORY ?Patient is seen in his room with no family at the bedside.  He has been hemodynamically stable overnight, and he is more restless and agitated this morning.  Follow up CT scan shows stable ICH.  Will increase HTS to 75cc/hr as Na is only 141.  He had an episode of SVT to the 180s this morning with stable BP which resolved with labetalol administration.  PO metoprolol ordered. ? ?Vitals:  ? 06/05/21 0930 06/05/21 0945 06/05/21 1000 06/05/21 1015  ?BP: (!) 166/83 134/71 (!) 146/78 (!) 162/76  ?Pulse: 95 94 (!) 101 (!) 108  ?Resp: 19 (!) 24 (!) 28 15  ?Temp:      ?TempSrc:      ?SpO2: 94% 93% 92% 96%  ?Weight:      ? ?CBC:  ?Recent Labs  ?Lab 06/04/21 ?0028 06/04/21 ?0034  ?WBC 17.6*  --   ?NEUTROABS 14.3*  --   ?HGB 13.0 12.9*  ?HCT 37.0* 38.0*  ?MCV 90.5  --   ?PLT 283  --   ? ? ?Basic Metabolic Panel:  ?Recent Labs  ?Lab 06/04/21 ?0028 06/04/21 ?0034 06/04/21 ?1206 06/05/21 ?6144 06/05/21 ?0740  ?NA 139 138   < > 141 143  ?K 3.8 3.9  --   --   --   ?CL 110 106  --   --   --   ?CO2 22  --   --   --   --   ?GLUCOSE 120* 120*  --   --   --   ?BUN 13 14  --   --   --   ?CREATININE 1.00 0.90  --   --   --   ?CALCIUM 8.6*  --   --   --   --   ? < > = values in this interval not displayed.  ? ? ?Lipid Panel:  ?Recent Labs  ?Lab 06/04/21 ?0028  ?CHOL 165  ?TRIG 46  ?HDL 38*  ?CHOLHDL 4.3  ?VLDL 9  ?LDLCALC 118*  ? ? ?HgbA1c:  ?Recent Labs  ?Lab 06/04/21 ?0028  ?HGBA1C 5.6  ? ? ?Urine Drug Screen:  ?Recent Labs  ?Lab 06/05/21 ?0224  ?LABOPIA NONE DETECTED  ?COCAINSCRNUR NONE DETECTED  ?LABBENZ NONE DETECTED  ?AMPHETMU NONE DETECTED  ?THCU NONE DETECTED  ?LABBARB NONE DETECTED  ? ?Alcohol Level No results for input(s): ETH in the last 168 hours. ? ?IMAGING past 24 hours ?CT HEAD WO CONTRAST ( ) ? ?Result Date: 06/05/2021 ?CLINICAL DATA:  71 year old male with large posterior left frontal lobe hemorrhage. EXAM: CT HEAD WITHOUT CONTRAST TECHNIQUE: Contiguous axial images were  obtained from the base of the skull through the vertex without intravenous contrast. RADIATION DOSE REDUCTION: This exam was performed according to the departmental dose-optimization program which includes automated exposure control, adjustment of the mA and/or kV according to patient size and/or use of iterative reconstruction technique. COMPARISON:  Brain MRI 06/04/2021 and earlier. FINDINGS: Study is mildly degraded by motion artifact today. Brain: Hyperdense hemorrhage in the left middle frontal gyrus extending from the periventricular white matter to the surface encompasses roughly 40 x 42 x 45 mm (AP by transverse by CC) for an estimated volume of 38 mL, but visually does not appear significantly changed in size or configuration when allowing for motion compared to the CT on 06/04/2021. Regional edema and mass effect appears stable. Small volume of hemorrhage layering in the occipital horns stable from the MRI yesterday, and redistributed from the left  occipital horn from the prior CT. No ventriculomegaly. Stable mild rightward midline shift of 4 mm. Basilar cisterns appear stable. No new intracranial abnormality identified. Vascular: Motion artifact. Skull: Motion artifact, especially at the skull base. No acute osseous abnormality identified. Sinuses/Orbits: Motion artifact, pneumatization appears stable. Other: No acute orbit or scalp soft tissue finding identified. IMPRESSION: 1. Motion degraded exam with grossly stable left frontal lobe hemorrhage, surrounding edema, and mass effect since yesterday. 2. No new intracranial abnormality identified. Electronically Signed   By: Odessa FlemingH  Hall M.D.   On: 06/05/2021 09:54  ? ?MR MRA HEAD WO CONTRAST ? ?Result Date: 06/04/2021 ?CLINICAL DATA:  Hemorrhagic stroke, brain metastases suspected EXAM: MRI HEAD WITHOUT AND WITH CONTRAST MRA HEAD WITHOUT CONTRAST MRA NECK WITHOUT AND WITH CONTRAST TECHNIQUE: Multiplanar, multi-echo pulse sequences of the brain and surrounding  structures were acquired without and with intravenous contrast. Angiographic images of the Circle of Willis were acquired using MRA technique without intravenous contrast. Angiographic images of the neck were acquired using MRA technique without and with intravenous contrast. Carotid stenosis measurements (when applicable) are obtained utilizing NASCET criteria, using the distal internal carotid diameter as the denominator. CONTRAST:  7mL GADAVIST GADOBUTROL 1 MMOL/ML IV SOLN COMPARISON:  No prior MRI correlation is made with CT head 06/04/2021 FINDINGS: Evaluation is limited by motion artifact. MRI HEAD FINDINGS Brain: Redemonstrated intraparenchymal hemorrhage in the left frontal and parietal lobes, which measures approximately 4.5 x 3.6 x 4.5 cm (series 6, image 18 and series 11, image 14), previously 4.5 x 3.6 x 4.3 cm cm when remeasured similarly, overall unchanged. Again noted is adjacent subarachnoid hemorrhage and moderate surrounding edema. Effacement of the surrounding sulci, with mass effect on the superior aspect of the left lateral ventricle and approximately 6 mm of left-to-right midline shift at the level of the septum pellucidum, unchanged when remeasured similarly. Hemorrhage is noted within the left greater than right lateral ventricle. No definite hydrocephalus. Evaluation on postcontrast imaging is limited by motion, however there appears to be small focus of enhancement within the hemorrhage (series 15, image 20), concerning for minimal active extravasation. No other abnormal parenchymal or meningeal enhancement. Increased signal on diffusion-weighted imaging is seen surrounding the hematoma, without distant restricted diffusion to suggest additional acute infarct. The basal cisterns are patent. Vascular: Please see MRA findings below. Skull and upper cervical spine: Normal marrow signal. Sinuses/Orbits: Mucosal thickening in the ethmoid air cells. Otherwise unremarkable. Other: The mastoids are  well aerated. MRA HEAD FINDINGS Anterior circulation: Both internal carotid arteries are patent to the termini, without significant stenosis. A1 segments patent. Normal anterior communicating artery. Anterior cerebral arteries are patent to their distal aspects. No M1 stenosis or occlusion. Normal MCA bifurcations. Distal MCA branches perfused, with somewhat decreased perfusion in the distal left MCA branches, possibly vasospasm. Posterior circulation: The right vertebral artery is patent to the vertebrobasilar junction, in a right dominant system. The left vertebral artery appears to primarily supplies the left PICA. Basilar patent to its distal aspect. Superior cerebellar arteries patent bilaterally. Patent P1 segments. PCAs perfused to their distal aspects without stenosis. The bilateral posterior communicating arteries are visualized. Anatomic variants: None significant MRA NECK FINDINGS Aortic arch: Two-vessel arch with a common origin of the brachiocephalic and left common carotid arteries. Imaged portion shows no evidence of aneurysm or dissection. No significant stenosis of the major arch vessel origins. Right carotid system: No evidence of dissection, occlusion, or hemodynamically significant stenosis (greater than 50%). Left carotid system: No evidence of  dissection, occlusion, or hemodynamically significant stenosis (greater than 50%). Vertebral arteries: Diminutive left vertebral artery. No evidence of dissection, occlusion, or hemodynamically significant stenosis (greater than 50%). Other: None IMPRESSION: 1. Evaluation is limited by motion artifact. Within this limitation, again noted is a large intraparenchymal hemorrhage centered in the posterior left frontal lobe with overall unchanged size and mass effect, with approximately 6 mm of left-to-right midline shift. A small amount of contrast enhancement is noted, suggesting a small amount of active extravasation. No evidence of underlying mass. 2.  Hemorrhage within the left-greater-than-right lateral ventricle without evidence of hydrocephalus. 3.  No intracranial large vessel occlusion or significant stenosis. 4.  No hemodynamically significant stenosis

## 2021-06-05 NOTE — Progress Notes (Signed)
Patient HR sustaining 170-180s. Pushed 20mg  labetalol with Dr. at bedside. HR now 80s. ?

## 2021-06-05 NOTE — Progress Notes (Signed)
Pt was bladder scanned, and amount was 680, I & Out was done and yielded 1000 ml, will continue to monitor, restless and trying to get off the restraints ?

## 2021-06-05 NOTE — Progress Notes (Signed)
OT Cancellation Note ? ?Patient Details ?Name: Louis Moore ?MRN: [id number] ?DOB: 1950-06-03 ? ? ?Cancelled Treatment:    Reason Eval/Treat Not Completed: Patient not medically ready. ? ?Desteny Freeman C., OTR/L ?Acute Rehabilitation Services ?Pager [phone] ?Office [phone] ? ? ?Vong Garringer M ?06/05/2021, 10:02 AM ?

## 2021-06-05 NOTE — Progress Notes (Signed)
MD on call made aware of Held metoprolol PO due to pt's mentation status, incoherent to swallow  ?

## 2021-06-06 DIAGNOSIS — I610 Nontraumatic intracerebral hemorrhage in hemisphere, subcortical: Secondary | ICD-10-CM | POA: Diagnosis not present

## 2021-06-06 LAB — BASIC METABOLIC PANEL
Anion gap: 11 (ref 5–15)
BUN: 20 mg/dL (ref 8–23)
CO2: 20 mmol/L — ABNORMAL LOW (ref 22–32)
Calcium: 8.5 mg/dL — ABNORMAL LOW (ref 8.9–10.3)
Chloride: 125 mmol/L — ABNORMAL HIGH (ref 98–111)
Creatinine, Ser: 1.15 mg/dL (ref 0.61–1.24)
GFR, Estimated: >60 mL/min (ref >60)
Glucose, Bld: 113 mg/dL — ABNORMAL HIGH (ref 70–99)
Potassium: 3.8 mmol/L (ref 3.5–5.1)
Sodium: 156 mmol/L — ABNORMAL HIGH (ref 135–145)

## 2021-06-06 LAB — CBC
HCT: 33.3 % — ABNORMAL LOW (ref 39.0–52.0)
Hemoglobin: 11.1 g/dL — ABNORMAL LOW (ref 13.0–17.0)
MCH: 30.7 pg (ref 26.0–34.0)
MCHC: 33.3 g/dL (ref 30.0–36.0)
MCV: 92 fL (ref 80.0–100.0)
Platelets: 264 10*3/uL (ref 150–400)
RBC: 3.62 MIL/uL — ABNORMAL LOW (ref 4.22–5.81)
RDW: 14.5 % (ref 11.5–15.5)
WBC: 11.5 10*3/uL — ABNORMAL HIGH (ref 4.0–10.5)
nRBC: 0 % (ref 0.0–0.2)

## 2021-06-06 LAB — SODIUM
Sodium: 155 mmol/L — ABNORMAL HIGH (ref 135–145)
Sodium: 156 mmol/L — ABNORMAL HIGH (ref 135–145)
Sodium: 153 mmol/L — ABNORMAL HIGH (ref 135–145)

## 2021-06-06 MED ORDER — HALOPERIDOL 5 MG PO TABS
5.0000 mg | ORAL_TABLET | Freq: Four times a day (QID) | ORAL | Status: DC | PRN
  Administered 2021-06-06 (×3): 5 mg via ORAL
  Filled 2021-06-06 (×4): qty 1

## 2021-06-06 NOTE — Progress Notes (Addendum)
STROKE TEAM PROGRESS NOTE  ?INTERVAL HISTORY ?Patient is seen in his room with no family at the bedside.  Overnight, his sodium increased to 156 and his HTS was decreased to 25cc/hr.  He became bradycardic to the 30s while on Precedex, so that was discontinued.  Agitation continues but is improved today.  His neurological exam is improved today. ? ?Vitals:  ? 06/06/21 1015 06/06/21 1030 06/06/21 1045 06/06/21 1157  ?BP: (!) 174/66 (!) 160/67 (!) 159/65   ?Pulse: 63 71 74   ?Resp: 17 (!) 22 20   ?Temp:    98 ?F (36.7 ?C)  ?TempSrc:    Axillary  ?SpO2: 96% 97% 97%   ?Weight:      ? ?CBC:  ?Recent Labs  ?Lab 06/04/21 ?0028 06/04/21 ?0034 06/05/21 ?1048 06/06/21 ?0428  ?WBC 17.6*  --  18.0* 11.5*  ?NEUTROABS 14.3*  --   --   --   ?HGB 13.0   < > 13.0 11.1*  ?HCT 37.0*   < > 37.7* 33.3*  ?MCV 90.5  --  90.2 92.0  ?PLT 283  --  322 264  ? < > = values in this interval not displayed.  ? ? ?Basic Metabolic Panel:  ?Recent Labs  ?Lab 06/05/21 ?1048 06/05/21 ?1452 06/06/21 ?0428 06/06/21 ?1751  ?NA 143   < > 156* 155*  ?K 3.9  --  3.8  --   ?CL 116*  --  125*  --   ?CO2 18*  --  20*  --   ?GLUCOSE 99  --  113*  --   ?BUN 16  --  20  --   ?CREATININE 1.02  --  1.15  --   ?CALCIUM 8.6*  --  8.5*  --   ? < > = values in this interval not displayed.  ? ? ?Lipid Panel:  ?Recent Labs  ?Lab 06/04/21 ?0028  ?CHOL 165  ?TRIG 46  ?HDL 38*  ?CHOLHDL 4.3  ?VLDL 9  ?LDLCALC 118*  ? ? ?HgbA1c:  ?Recent Labs  ?Lab 06/04/21 ?0028  ?HGBA1C 5.6  ? ? ?Urine Drug Screen:  ?Recent Labs  ?Lab 06/05/21 ?0224  ?LABOPIA NONE DETECTED  ?COCAINSCRNUR NONE DETECTED  ?LABBENZ NONE DETECTED  ?AMPHETMU NONE DETECTED  ?THCU NONE DETECTED  ?LABBARB NONE DETECTED  ? ? ?Alcohol Level No results for input(s): ETH in the last 168 hours. ? ?IMAGING past 24 hours ?No results found. ? ?PHYSICAL EXAM ?Constitutional: Elderly white male who is not in distress but mildly agitated this morning ?Eyes: No scleral injection ?Respiratory: Effort normal, non-labored  breathing ?Gait and Station: Deferred ? ?Neuro: ?PERRL, will intermittently follow commands and is able to state his name.  Will move LUE and BLE spontaneously, no movement of RUE.  Tongue midline ? ?ASSESSMENT/PLAN ?[name] is a 71 y.o. male with history of not going to PCP in decades presented (3/31) for right sided hemiplegia and numbness after 1 day of hand tingling. Admitted for left high frontal ICH. 12hrs MRI showed ICH stable without hydrocephalus or herniation. CT head on 4/1 shows stable ICH. ?  ? ?Stroke: Left high frontal parenchymal ICH presumably from uncontrolled hypertension with mild cytotoxic edema and trace intraventricular extension but no hydrocephalus ?code Stroke CT head:  Acute intraparenchymal hemorrhage centered at the high posterior left frontal lobe measures 4.0 x 3.2 x 4.5 cm. Surrounding low-density vasogenic edema. Associated small volume subarachnoid hemorrhage within the adjacent left frontal region. Intraventricular extension with blood seen within the left  greater than right lateral ventricles. ICH volume ~ 29 mL ?CT head at 3hrs and 8hrs after: Stable ICH ?MRI: Stable ICH, no hydrocephalus, no herniations ?MRA: No large vessel stenosis or occlusion.  No aneurysms.   ?2D Echo: Pending ?LDL 118  (06/04/2021) ?HgbA1c 5.6 (06/04/2021)  ?VTE prophylaxis - Scd's start: 06/04/21 0052, Sequential compression devices, below knee,  ?Diet: Dysph 3 ?No antithrombotic prior to admission, no antithrombotic now secondary to ICH ?Therapy recommendations: CIR ?Disposition: Acute inpatient rehab (3hours/day) Pending ? ?Hypertension ?Home meds: N/A  ?Unstable, still requiring cleviprex ?Long-term BP goal normotensive ?PRN labetalol, hydralazine  ?SBP goal <160  ? ?Hyperlipidemia ?Home meds:  N/A ?LDL 118  (06/04/2021), goal < 70  ?Will add statin at discharge ? ?Other Stroke Risk Factors ?Advanced Age >/= 61  ?Family hx stroke - family history is not on file.  ?Has not seen PCP in  decades ?UDS pending ? ?Other Active Problems ?SVT ?Patient had an episode of SVT with rate to the 180s on 4/1.  This resolved with labetalol administration ?Metoprolol 25 mg BID ordered ?Agitation ?Patient has persistent agitation despite trials of seroquel, low dose haldol and clonazepam ?Precedex decreased agitation but caused patient to become bradycardic to the 30s ?Will attempt to use PO haldol for agitation at this time ?Delirium precautions ? ? ?Hospital day # 2 ? ? ATTENDING ATTESTATION: ?  ?71 year old gentleman with left femoral intracranial hemorrhage with CTs scans  stable. Na elevated, hypertonic decreased to 25cc/hr.  On exam he is better. Able to tell us his name, that he is in the hospital but with severe dysarthria/hypophonia. Able to stick his tongue out but did not follow other commands.   Left gaze preference but crosses midline.  Right hemiparesis. ?Metoprolol stopped due to bradycardia, precedex stopped. Encourage PO intake and may need cortrack tomorrow. Work with PT/OT.  ?  ?Dr. Viviann Spare evaluated pt independently, reviewed imaging, chart, labs. Discussed and formulated plan with the APP. Please see APP note above for details.    ?  ?This patient is critically ill due to respiratory distress, ICH on hypertonic saline, tachycardia and at significant risk of neurological worsening, death form heart failure, respiratory failure, recurrent stroke, bleeding from [facility], seizure, sepsis. This patient's care requires constant monitoring of vital signs, hemodynamics, respiratory and cardiac monitoring, review of multiple databases, neurological assessment, discussion with family, other specialists and medical decision making of high complexity. I spent 40 minutes of neurocritical care time in the care of this patient.  ?  ?Kym Fenter,MD  ? ?To contact Stroke Continuity provider, please refer to [email or web address]. ?After hours, contact General Neurology   ?

## 2021-06-06 NOTE — Progress Notes (Signed)
Inpatient Rehab Admissions Coordinator Note:  ? ?Per PT patient was screened for CIR candidacy by Maneh Sieben Luvenia Starch, CCC-SLP. At this time, pt has not yet attempted transfers and is on Precedex. Pt may have potential to progress to becoming a potential CIR candidate. CIR admissions team will follow and monitor medical workup and progress with therapies.  ? ? ?Wolfgang Phoenix, MS, CCC-SLP ?Admissions Coordinator ?[phone] ?06/06/21 ?4:29 PM ? ?

## 2021-06-06 NOTE — Evaluation (Signed)
Physical Therapy Evaluation ?Patient Details ?Name: Louis Moore ?MRN: [id number] ?DOB: 09-Feb-1951 ?Today's Date: 06/06/2021 ? ?History of Present Illness ? Patient is a 71 y/o male admitted with R hemiplegia and numbness found to have high L frontal ICH w/ mild cytotoxic edema, trace IV extension and 69mm midline shift. Stated on hypertonic saline, cleviprex.  No PMH as has not seen PCP in decades.  ?Clinical Impression ? Patient presents with decreased mobility due to generalized weakness, R side hemiparesis with neglect, decreased balance, decreased safety and deficit awareness.  Currently max to +2 A for EOB activity with some pusher tendencies.  Family in the room and supportive.  Previously pt independent living with his brother.  Feel he will benefit from skilled PT in the acute setting and from follow up acute inpatient rehab at d/c.    ?   ? ?Recommendations for follow up therapy are one component of a multi-disciplinary discharge planning process, led by the attending physician.  Recommendations may be updated based on patient status, additional functional criteria and insurance authorization. ? ?Follow Up Recommendations Acute inpatient rehab (3hours/day) ? ?  ?Assistance Recommended at Discharge Frequent or constant Supervision/Assistance  ?Patient can return home with the following ? Two people to help with walking and/or transfers;Two people to help with bathing/dressing/bathroom;Assistance with cooking/housework;Direct supervision/assist for medications management;Assist for transportation;Help with stairs or ramp for entrance;Direct supervision/assist for financial management;Assistance with feeding ? ?  ?Equipment Recommendations Other (comment) (to be determined)  ?Recommendations for Other Services ? Rehab consult  ?  ?Functional Status Assessment Patient has had a recent decline in their functional status and demonstrates the ability to make significant improvements in function in a reasonable and  predictable amount of time.  ? ?  ?Precautions / Restrictions Precautions ?Precautions: Fall ?Precaution Comments: R neglect, hemiparesis ?Restrictions ?Weight Bearing Restrictions: No  ? ?  ? ?Mobility ? Bed Mobility ?Overal bed mobility: Needs Assistance ?Bed Mobility: Supine to Sit, Sit to Supine ?  ?  ?Supine to sit: Max assist, HOB elevated ?Sit to supine: Total assist, +2 for physical assistance ?  ?General bed mobility comments: assisted legs off bed and trunk upright, pt pushing back and at times to R; to supine with +2 for shoulders/trunk and hips/legs ?  ? ?Transfers ?  ?  ?  ?  ?  ?  ?  ?  ?  ?General transfer comment: NT, unsafe due to  balance, cognitive status ?  ? ?Ambulation/Gait ?  ?  ?  ?  ?  ?  ?  ?  ? ?Stairs ?  ?  ?  ?  ?  ? ?Wheelchair Mobility ?  ? ?Modified Rankin (Stroke Patients Only) ?Modified Rankin (Stroke Patients Only) ?Pre-Morbid Rankin Score: No symptoms ?Modified Rankin: Severe disability ? ?  ? ?Balance Overall balance assessment: Needs assistance ?Sitting-balance support: Feet supported ?Sitting balance-Leahy Scale: Zero ?Sitting balance - Comments: pulling forward, pushing to R, A for head righting, support for L UE and behind L side to prevent pushing to R; sat EOB about 5 minutes ?  ?  ?  ?  ?  ?  ?  ?  ?  ?  ?  ?  ?  ?  ?  ?   ? ? ? ?Pertinent Vitals/Pain Pain Assessment ?Pain Assessment: No/denies pain  ? ? ?Home Living Family/patient expects to be discharged to:: Private residence ?Living Arrangements: Other relatives (brother) ?  ?Type of Home: Mobile home ?Home Access: Stairs to enter ?Entrance  Stairs-Rails: Right ?Entrance Stairs-Number of Steps: 6 ?  ?Home Layout: One level ?Home Equipment: None ?   ?  ?Prior Function Prior Level of Function : Independent/Modified Independent ?  ?  ?  ?  ?  ?  ?  ?  ?  ? ? ?Hand Dominance  ?   ? ?  ?Extremity/Trunk Assessment  ? Upper Extremity Assessment ?Upper Extremity Assessment: RUE deficits/detail ?RUE Deficits / Details: fixes  into extension with passive movement, unaware of hand placed on  his head ?  ? ?Lower Extremity Assessment ?Lower Extremity Assessment: RLE deficits/detail ?RLE Deficits / Details: minimal active movement, but positve tone (not flaccid) ?  ? ?Cervical / Trunk Assessment ?Cervical / Trunk Assessment: Other exceptions ?Cervical / Trunk Exceptions: twisting to R and flexing forward R hemipelvic retraction seated EOB  ?Communication  ? Communication: Expressive difficulties (mumbles, occasional understandable word)  ?Cognition Arousal/Alertness: Lethargic ?Behavior During Therapy: Impulsive, Restless ?Overall Cognitive Status: Difficult to assess ?Area of Impairment: Attention, Following commands, Safety/judgement ?  ?  ?  ?  ?  ?  ?  ?  ?  ?Current Attention Level: Focused ?  ?Following Commands: Follows one step commands inconsistently ?Safety/Judgement: Decreased awareness of deficits, Decreased awareness of safety ?  ?  ?  ?  ?  ? ?  ?General Comments General comments (skin integrity, edema, etc.): Sisters in the room and supportive, providing history and pt at times locating them visually, but keeping eyes shut much of time ? ?  ?Exercises    ? ?Assessment/Plan  ?  ?PT Assessment Patient needs continued PT services  ?PT Problem List Decreased strength;Decreased mobility;Decreased safety awareness;Decreased balance;Decreased knowledge of use of DME;Impaired sensation;Decreased cognition;Decreased coordination;Decreased knowledge of precautions ? ?   ?  ?PT Treatment Interventions DME instruction;Therapeutic activities;Cognitive remediation;Patient/family education;Therapeutic exercise;Gait training;Balance training;Functional mobility training;Neuromuscular re-education   ? ?PT Goals (Current goals can be found in the Care Plan section)  ?Acute Rehab PT Goals ?Patient Stated Goal: for eventual return home ?PT Goal Formulation: With family ?Time For Goal Achievement: 06/20/21 ?Potential to Achieve Goals: Fair ? ?   ?Frequency Min 4X/week ?  ? ? ?Co-evaluation   ?  ?  ?  ?  ? ? ?  ?AM-PAC PT "6 Clicks" Mobility  ?Outcome Measure Help needed turning from your back to your side while in a flat bed without using bedrails?: A Lot ?Help needed moving from lying on your back to sitting on the side of a flat bed without using bedrails?: Total ?Help needed moving to and from a bed to a chair (including a wheelchair)?: Total ?Help needed standing up from a chair using your arms (e.g., wheelchair or bedside chair)?: Total ?Help needed to walk in hospital room?: Total ?Help needed climbing 3-5 steps with a railing? : Total ?6 Click Score: 7 ? ?  ?End of Session   ?Activity Tolerance: Treatment limited secondary to agitation ?Patient left: in bed;with call bell/phone within reach ?Nurse Communication: Mobility status ?PT Visit Diagnosis: Other abnormalities of gait and mobility (R26.89);Hemiplegia and hemiparesis;Other symptoms and signs involving the nervous system (R29.898) ?Hemiplegia - Right/Left: Right ?Hemiplegia - dominant/non-dominant: Dominant ?Hemiplegia - caused by: Nontraumatic intracerebral hemorrhage ?  ? ?Time: [id number] ?PT Time Calculation (min) (ACUTE ONLY): 28 min ? ? ?Charges:   PT Evaluation ?$PT Eval Moderate Complexity: 1 Mod ?PT Treatments ?$Therapeutic Activity: 8-22 mins ?  ?   ? ? ?Magda Kiel, PT ?Acute Rehabilitation Services ?[id number] ?Office:[phone] ?06/06/2021 ? ? ?[name] ?  06/06/2021, 11:18 AM ? ?

## 2021-06-06 NOTE — Progress Notes (Signed)
Sodium 156, will cut down hypertonic to 25cc/hr. Repeat Na in 6 hours. ? ?Erick Blinks ?[facility] ?Pager Number [phone] ?

## 2021-06-07 DIAGNOSIS — I4891 Unspecified atrial fibrillation: Secondary | ICD-10-CM

## 2021-06-07 DIAGNOSIS — I610 Nontraumatic intracerebral hemorrhage in hemisphere, subcortical: Secondary | ICD-10-CM | POA: Diagnosis not present

## 2021-06-07 LAB — CBC
HCT: 40.6 % (ref 39.0–52.0)
Hemoglobin: 13.8 g/dL (ref 13.0–17.0)
MCH: 31.1 pg (ref 26.0–34.0)
MCHC: 34 g/dL (ref 30.0–36.0)
MCV: 91.4 fL (ref 80.0–100.0)
Platelets: 326 10*3/uL (ref 150–400)
RBC: 4.44 MIL/uL (ref 4.22–5.81)
RDW: 14.9 % (ref 11.5–15.5)
WBC: 16.3 10*3/uL — ABNORMAL HIGH (ref 4.0–10.5)
nRBC: 0 % (ref 0.0–0.2)

## 2021-06-07 LAB — MAGNESIUM
Magnesium: 2.7 mg/dL — ABNORMAL HIGH (ref 1.7–2.4)
Magnesium: 2.7 mg/dL — ABNORMAL HIGH (ref 1.7–2.4)

## 2021-06-07 LAB — GLUCOSE, CAPILLARY
Glucose-Capillary: 104 mg/dL — ABNORMAL HIGH (ref 70–99)
Glucose-Capillary: 109 mg/dL — ABNORMAL HIGH (ref 70–99)
Glucose-Capillary: 110 mg/dL — ABNORMAL HIGH (ref 70–99)
Glucose-Capillary: 110 mg/dL — ABNORMAL HIGH (ref 70–99)

## 2021-06-07 LAB — BASIC METABOLIC PANEL
Anion gap: 12 (ref 5–15)
BUN: 22 mg/dL (ref 8–23)
CO2: 19 mmol/L — ABNORMAL LOW (ref 22–32)
Calcium: 9.2 mg/dL (ref 8.9–10.3)
Chloride: 124 mmol/L — ABNORMAL HIGH (ref 98–111)
Creatinine, Ser: 1.05 mg/dL (ref 0.61–1.24)
GFR, Estimated: >60 mL/min (ref >60)
Glucose, Bld: 98 mg/dL (ref 70–99)
Potassium: 3.5 mmol/L (ref 3.5–5.1)
Sodium: 155 mmol/L — ABNORMAL HIGH (ref 135–145)

## 2021-06-07 LAB — PHOSPHORUS
Phosphorus: 2.1 mg/dL — ABNORMAL LOW (ref 2.5–4.6)
Phosphorus: 2.2 mg/dL — ABNORMAL LOW (ref 2.5–4.6)

## 2021-06-07 LAB — SODIUM
Sodium: 156 mmol/L — ABNORMAL HIGH (ref 135–145)
Sodium: 157 mmol/L — ABNORMAL HIGH (ref 135–145)
Sodium: 157 mmol/L — ABNORMAL HIGH (ref 135–145)

## 2021-06-07 MED ORDER — METOPROLOL TARTRATE 5 MG/5ML IV SOLN
INTRAVENOUS | Status: AC
  Administered 2021-06-07: 5 mg via INTRAVENOUS
  Filled 2021-06-07: qty 5

## 2021-06-07 MED ORDER — DILTIAZEM HCL 125 MG/25ML IV SOLN
20.0000 mg | Freq: Once | INTRAVENOUS | Status: AC
  Administered 2021-06-07: 20 mg via INTRAVENOUS

## 2021-06-07 MED ORDER — DILTIAZEM HCL 25 MG/5ML IV SOLN
10.0000 mg | Freq: Once | INTRAVENOUS | Status: AC
  Administered 2021-06-07: 10 mg via INTRAVENOUS
  Filled 2021-06-07: qty 5

## 2021-06-07 MED ORDER — PROSOURCE TF PO LIQD
45.0000 mL | Freq: Two times a day (BID) | ORAL | Status: DC
  Administered 2021-06-07 – 2021-06-08 (×2): 45 mL
  Filled 2021-06-07 (×2): qty 45

## 2021-06-07 MED ORDER — METOPROLOL TARTRATE 5 MG/5ML IV SOLN: 5.0000 mg | Freq: Once | INTRAVENOUS | Status: AC

## 2021-06-07 MED ORDER — AMLODIPINE BESYLATE 5 MG PO TABS: 5.0000 mg | ORAL_TABLET | Freq: Every day | ORAL | Status: DC

## 2021-06-07 MED ORDER — DILTIAZEM HCL-DEXTROSE 125-5 MG/125ML-% IV SOLN (PREMIX)
5.0000 mg/h | INTRAVENOUS | Status: DC
  Administered 2021-06-07: 15 mg/h via INTRAVENOUS
  Administered 2021-06-07: 5 mg/h via INTRAVENOUS
  Administered 2021-06-08: 15 mg/h via INTRAVENOUS
  Filled 2021-06-07 (×4): qty 125

## 2021-06-07 MED ORDER — VITAL HIGH PROTEIN PO LIQD
1000.0000 mL | ORAL | Status: DC
Start: 2021-06-07 — End: 2021-06-08

## 2021-06-07 MED ORDER — METOPROLOL TARTRATE 25 MG PO TABS: 25.0000 mg | ORAL_TABLET | Freq: Two times a day (BID) | ORAL | Status: DC

## 2021-06-07 NOTE — Progress Notes (Addendum)
STROKE TEAM PROGRESS NOTE  ?INTERVAL HISTORY ?Patient is seen in his room with no family at the bedside.  ?D/c hypertonic saline today, patient remains in restraints for safety with occasional agitation. ? ?1221: HR 180s sustained, PRN IV metoprolol given and HR dropped to 150s ?EKG shows afib w/ RVR. Cardizem drip ordered/bolus ordered ?Plan to consult cardiology for new onset A-fib ?Vitals:  ? 06/07/21 1315 06/07/21 1400 06/07/21 1401 06/07/21 1444  ?BP: (!) 135/57 (!) 160/79 (!) 160/79   ?Pulse: 72 88  (!) 140  ?Resp: 18 (!) 21  19  ?Temp:      ?TempSrc:      ?SpO2:  97%  95%  ?Weight:      ? ?CBC:  ?Recent Labs  ?Lab 06/04/21 ?0028 06/04/21 ?[id number] 06/06/21 ?[id number] 06/07/21 ?0431  ?WBC 17.6*   < > 11.5* 16.3*  ?NEUTROABS 14.3*  --   --   --   ?HGB 13.0   < > 11.1* 13.8  ?HCT 37.0*   < > 33.3* 40.6  ?MCV 90.5   < > 92.0 91.4  ?PLT 283   < > 264 326  ? < > = values in this interval not displayed.  ? ?Basic Metabolic Panel:  ?Recent Labs  ?Lab 06/06/21 ?0428 06/06/21 ?0924 06/07/21 ?0431 06/07/21 ?1001  ?NA 156*   < > 155* 156*  ?K 3.8  --  3.5  --   ?CL 125*  --  124*  --   ?CO2 20*  --  19*  --   ?GLUCOSE 113*  --  98  --   ?BUN 20  --  22  --   ?CREATININE 1.15  --  1.05  --   ?CALCIUM 8.5*  --  9.2  --   ?MG  --   --   --  2.7*  ?PHOS  --   --   --  2.1*  ? < > = values in this interval not displayed.  ? ?Lipid Panel:  ?Recent Labs  ?Lab 06/04/21 ?0028  ?CHOL 165  ?TRIG 46  ?HDL 38*  ?CHOLHDL 4.3  ?VLDL 9  ?LDLCALC 118*  ? ?HgbA1c:  ?Recent Labs  ?Lab 06/04/21 ?0028  ?HGBA1C 5.6  ? ?Urine Drug Screen:  ?Recent Labs  ?Lab 06/05/21 ?0224  ?LABOPIA NONE DETECTED  ?COCAINSCRNUR NONE DETECTED  ?LABBENZ NONE DETECTED  ?AMPHETMU NONE DETECTED  ?THCU NONE DETECTED  ?LABBARB NONE DETECTED  ? ?Alcohol Level No results for input(s): ETH in the last 168 hours. ? ?IMAGING past 24 hours ?No results found. ? ?PHYSICAL EXAM ?Constitutional: Elderly white male who is not in distress but mildly agitated this morning ?Eyes: No  scleral injection ?Respiratory: Effort normal, non-labored breathing ?Gait and Station: Deferred ? ?Neuro: ?PERRL, drowsy but can be easily aroused.  Will intermittently follow only a few midline commands, expressive aphasia.  Will move LUE and BLE spontaneously, no movement of RUE.  Tongue midline ? ?ASSESSMENT/PLAN ?[name] is a 71 y.o. male with history of not going to PCP in decades presented (3/31) for right sided hemiplegia and numbness after 1 day of hand tingling. Admitted for left high frontal ICH. 12hrs MRI showed ICH stable without hydrocephalus or herniation. CT head on 4/1 shows stable ICH. Patient in SVT vs afib w/ RVR, cardiology consulted on 4/3. Cardizem infusion ordered.  ?  ? ?Stroke: Left high frontal parenchymal ICH presumably from uncontrolled hypertension with mild cytotoxic edema and trace intraventricular extension but no hydrocephalus ?code Stroke CT  head:  Acute intraparenchymal hemorrhage centered at the high posterior left frontal lobe measures 4.0 x 3.2 x 4.5 cm. Surrounding low-density vasogenic edema. Associated small volume subarachnoid hemorrhage within the adjacent left frontal region. Intraventricular extension with blood seen within the left greater than right lateral ventricles. ICH volume ~ 29 mL ?CT head at 3hrs and 8hrs after: Stable ICH ?MRI: Stable ICH, no hydrocephalus, no herniations ?MRA: No large vessel stenosis or occlusion.  No aneurysms.   ?2D Echo: Pending ?LDL 118  (06/04/2021) ?HgbA1c 5.6 (06/04/2021)  ?VTE prophylaxis - Scd's start: 06/04/21 0052, Sequential compression devices, below knee, Bilateral lower extremities,  ?Diet: Dysph 3 ?No antithrombotic prior to admission, no antithrombotic now secondary to [place] ?Therapy recommendations: CIR ?Disposition: Acute inpatient rehab (3hours/day) Pending ? ?New Onset Atrial fibrillation ?EKG shows afib w/ RVR ?Cardizem gtt with one time IVP ordered for rate control ?Cardiology consulted  ? ?Hypertension ?Home  meds: N/A  ?Unstable, still requiring cleviprex ?Long-term BP goal normotensive ?PRN labetalol, hydralazine  ?SBP goal <160  ? ?Hyperlipidemia ?Home meds:  N/A ?LDL 118  (06/04/2021), goal < 70  ?Will add statin at discharge ? ?Other Stroke Risk Factors ?Advanced Age >/= 22  ?Family hx stroke - family history is not on file.  ?Has not seen PCP in decades ?UDS pending ? ?Other Active Problems ?SVT vs afib?  ?Patient had an episode of SVT with rate to the 180s on 4/1.  This resolved with labetalol administration ?Agitation ?Clonidine, seroquel, haldol  ?Precedex resulted in bradycardia with HR 30 ?Delirium precautions  ? ? ?Hospital day # 3 ? ?Patient seen and examined by NP/APP with MD. MD to update note as needed.  ? ?Janine Ores, DNP, FNP-BC ?[facility] ?Pager: [phone] ? ?STROKE MD NOTE : Patient continues to be intermittently agitated requiring restraints.  CT head 2 days prior had shown stable appearance of the hemorrhage and cytotoxic edema and midline shift.  Serum sodium is optimal and will start weaning and tapering hypertonic saline drip over the next 24 hours.  Start p.o. blood pressure medications and use as needed IV labetalol and hydralazine and try to wean Cleviprex drip.  Patient has developed new onset A-fib with start Cardizem and consult cardiology for new onset A-fib management.  No family available at the bedside for discussion.This patient is critically ill and at significant risk of neurological worsening, recurrent stroke, death and care requires constant monitoring of vital signs, hemodynamics,respiratory and cardiac monitoring, extensive review of multiple databases, frequent neurological assessment, discussion with family, other specialists and medical decision making of high complexity.I have made any additions or clarifications directly to the above note.This critical care time does not reflect procedure time, or teaching time or supervisory time of PA/NP/Med Resident  etc but could involve care discussion time. ? I spent 30 minutes of neurocritical care time  in the care of  this patient. ? ?Antony Contras, MD ? ?To contact Stroke Continuity provider, please refer to [email or web address] ?After hours, contact General Neurology   ?

## 2021-06-07 NOTE — Progress Notes (Signed)
Physical Therapy Treatment ?Patient Details ?Name: Louis Moore ?MRN: [id number] ?DOB: [birth year] ?Today's Date: 06/07/2021 ? ? 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 Stated on hypertonic saline, cleviprex.  No PMH as has not seen PCP in decades. ? ?  ?PT Comments  ? ? Patient progressing this session to standing with 2 person assist.  Able to maintain more than 30 seconds and tolerated with VSS.  Able to use L UE for attempts at self feeding with OT assist, but pocketing and not able to manage solids safely at this time, also not initiating sipping nectar liquids from his tray with presented.  Patient seems to understand some basic communication with laughter when appropriate, attempting to sing "Happy [place] OuchBirthday" with therapists and following commands for sit to stand x 2.  He remains aphasic and unable to communicate needs at this time.  PT will continue to follow acutely.  Appropriate for acute inpatient rehab at d/c.    ?Recommendations for follow up therapy are one component of a multi-disciplinary discharge planning process, led by the attending physician.  Recommendations may be updated based on patient status, additional functional criteria and insurance authorization. ? ?Follow Up Recommendations ? Acute inpatient rehab (3hours/day) ?  ?  ?Assistance Recommended at Discharge Frequent or constant Supervision/Assistance  ?Patient can return home with the following Two people to help with walking and/or transfers;Two people to help with bathing/dressing/bathroom;Assistance with cooking/housework;Direct supervision/assist for medications management;Assist for transportation;Help with stairs or ramp for entrance;Direct supervision/assist for financial management;Assistance with feeding ?  ?Equipment Recommendations ? Other (comment) (TBD)  ?  ?Recommendations for  Other Services   ? ? ?  ?Precautions / Restrictions Precautions ?Precautions: Fall ?Precaution Comments: R neglect, hemiparesis  ?  ? ?Mobility ? Bed Mobility ?Overal bed mobility: Needs Assistance ?Bed Mobility: Rolling, Sidelying to Sit ?Rolling: Max assist, +2 for physical assistance ?Sidelying to sit: +2 for physical assistance, Max assist ?  ?Sit to supine: Total assist, +2 for physical assistance ?  ?General bed mobility comments: assisting with most aspect, pt helping to push up with L hand once legs off bed and initially lifting trunk, to supine assist for all aspects. ?  ? ?Transfers ?Overall transfer level: Needs assistance ?Equipment used: 2 person hand held assist ?Transfers: Sit to/from Stand ?Sit to Stand: Max assist, +2 physical assistance ?  ?  ?  ?  ?  ?General transfer comment: assist for L lateral weight shift, pt holding therapists arm with L UE and R knee blocked, pt able to accept weight in standing on R LE and stood twice for about 30-60 seconds each time for bed linen change; returned to supine for safety due to pt fatigue and for safety ?  ? ?Ambulation/Gait ?  ?  ?  ?  ?  ?  ?  ?  ? ? ?Stairs ?  ?  ?  ?  ?  ? ? ?Wheelchair Mobility ?  ? ?Modified Rankin (Stroke Patients Only) ?Modified Rankin (Stroke Patients Only) ?Pre-Morbid Rankin Score: No symptoms ?Modified Rankin: Severe disability ? ? ?  ?Balance Overall balance assessment: Needs assistance ?Sitting-balance support: Feet supported ?Sitting balance-Leahy Scale: Zero ?Sitting balance - Comments: max A for sitting balance while attempted to eat breakfast; head and trunk tendency to lean R and when L hand on the bed pushing to R; pt  placed hat on his head using L hand with min A ?  ?Standing balance support: Bilateral upper extremity supported ?Standing balance-Leahy Scale: Zero ?Standing balance comment: mod A of 2 for standing with support at R knee and L hand and behind hips stood x 2 for 30-60 seconds ?  ?  ?  ?  ?  ?  ?  ?  ?  ?  ?   ?  ? ?  ?Cognition Arousal/Alertness: Awake/alert ?Behavior During Therapy: Impulsive, Restless ?Overall Cognitive Status: Difficult to assess ?Area of Impairment: Attention, Following commands, Safety/judgement ?  ?  ?  ?  ?  ?  ?  ?  ?  ?Current Attention Level: Focused ?  ?Following Commands: Follows one step commands inconsistently ?Safety/Judgement: Decreased awareness of deficits, Decreased awareness of safety ?  ?  ?General Comments: automatic reaching for and attempting to eat eggs and hold and drink juice, but pocketing and unable to manage solids nor initiate sipping his juice.  attempted to sing "Happy Birthday" in standing when therapists singing ?  ?  ? ?  ?Exercises   ? ?  ?General Comments General comments (skin integrity, edema, etc.): VSS, BP < 160 throughout ?  ?  ? ?Pertinent Vitals/Pain Pain Assessment ?Pain Assessment: Faces ?Faces Pain Scale: No hurt  ? ? ?Home Living   ?  ?  ?  ?  ?  ?  ?  ?  ?  ?   ?  ?Prior Function    ?  ?  ?   ? ?PT Goals (current goals can now be found in the care plan section) Progress towards PT goals: Progressing toward goals ? ?  ?Frequency ? ? ? Min 4X/week ? ? ? ?  ?PT Plan Current plan remains appropriate  ? ? ?Co-evaluation PT/OT/SLP Co-Evaluation/Treatment: Yes ?Reason for Co-Treatment: For patient/therapist safety;Necessary to address cognition/behavior during functional activity ?PT goals addressed during session: Mobility/safety with mobility;Balance ?  ?  ? ?  ?AM-PAC PT "6 Clicks" Mobility   ?Outcome Measure ? Help needed turning from your back to your side while in a flat bed without using bedrails?: A Lot ?Help needed moving from lying on your back to sitting on the side of a flat bed without using bedrails?: A Lot ?Help needed moving to and from a bed to a chair (including a wheelchair)?: Total ?Help needed standing up from a chair using your arms (e.g., wheelchair or bedside chair)?: Total ?Help needed to walk in hospital room?: Total ?Help needed  climbing 3-5 steps with a railing? : Total ?6 Click Score: 8 ? ?  ?End of Session Equipment Utilized During Treatment: Gait belt ?Activity Tolerance: Patient limited by fatigue ?Patient left: in bed;with call bell/phone within reach;with restraints reapplied ?Nurse Communication: Mobility status ?PT Visit Diagnosis: Other abnormalities of gait and mobility (R26.89);Hemiplegia and hemiparesis;Other symptoms and signs involving the nervous system (R29.898) ?Hemiplegia - Right/Left: Right ?Hemiplegia - dominant/non-dominant: Dominant ?Hemiplegia - caused by: Nontraumatic intracerebral hemorrhage ?  ? ? ?Time: [id number] ?PT Time Calculation (min) (ACUTE ONLY): 31 min ? ?Charges:  $Therapeutic Activity: 8-22 mins          ?          ? ?Magda Kiel, PT ?Acute Rehabilitation Services ?[id number] ?[phone] ?06/07/2021 ? ? ? ?[name] ?06/07/2021, 12:59 PM ? ?

## 2021-06-07 NOTE — Progress Notes (Signed)
Patient sustained HR in the 180-200s randomly. Rhythm jumps between SVT and AFIB-RVR. Elmer Picker NP paged to bedside. Patient given 5 mg IV metoprolol STAT and HR is now 130-160s AFIB. BP 101/89. Order to stop cleviprex and start patient on cardizem gtt.  ? ?Sherral Hammers RN ?

## 2021-06-07 NOTE — Progress Notes (Signed)
Speech Language Pathology Treatment: Dysphagia  ?Patient Details ?Name: Louis Moore ?MRN: [id number] ?DOB: 1951/01/29 ?Today's Date: 06/07/2021 ?Time: 4098-1191 ?SLP Time Calculation (min) (ACUTE ONLY): 15 min ? ?Assessment / Plan / Recommendation ?Clinical Impression ? Louis Moore demonstrated a decline in swallow function today when compared with 3/31 performance, at which time a dysphagia 3 diet was recommended. Today he demonstrated worsening oral attention and mastication with significant right oral residue.  Oral care was provided and copious amounts of food debris were suctioned from oral cavity.  After oral care, speech/voice quality improved. Recommend downgrading diet from dysphagia 3 to 1; continue nectar liquids. Provide oral suctioning after meals.  Allow ice chips between meals. ? ?Louis Moore demonstrated decreased spontaneous verbalizations. He counted from 1-10 and named the DOW with phonemic paraphasias.  Naming to confrontation was 75% accurate today. He followed 5/10 commands with mod verbal cues.  In general, he was much drowsier than he was during previous session. Recommend ongoing SLP for aphasia and dysphagia.  (Cortrak is pending- continue POs as long as he is alert).  ?  ?HPI HPI: 71 y.o. male with no significant PMH but has not seen a PCP in decades admitted with aphasia and right sided weakness. Dx acute Intraparenchymal hemorrhage in the high L posterior frontal lobe with associated cerebral edema, mass effect, midline shift and brain compression. ?  ?   ?SLP Plan ? Continue with current plan of care ? ?  ?  ?Recommendations for follow up therapy are one component of a multi-disciplinary discharge planning process, led by the attending physician.  Recommendations may be updated based on patient status, additional functional criteria and insurance authorization. ?  ? ?Recommendations  ?Diet recommendations: Dysphagia 1 (puree);Nectar-thick liquid ?Liquids provided via: Cup ?Medication  Administration: Crushed with puree ?Supervision: Full supervision/cueing for compensatory strategies ?Compensations: Minimize environmental distractions;Slow rate;Small sips/bites;Lingual sweep for clearance of pocketing ?Postural Changes and/or Swallow Maneuvers: Seated upright 90 degrees  ?   ?    ?   ? ? ? ? Oral Care Recommendations: Oral care BID ?Follow Up Recommendations: Acute inpatient rehab (3hours/day) ?Assistance recommended at discharge: Frequent or constant Supervision/Assistance ?SLP Visit Diagnosis: Dysphagia, oropharyngeal phase (R13.12);Aphasia (R47.01) ?Plan: Continue with current plan of care ? ? ? ? ?  ?  ?Louis Blinder L. Galvin Aversa, MA CCC/SLP ?Acute Rehabilitation Services ?Office number [phone] ?Pager [phone] ? ? ?Louis Moore ? ?06/07/2021, 2:40 PM ?

## 2021-06-07 NOTE — Consult Note (Signed)
?Cardiology Consultation:  ? ?Patient ID: Louis Moore ?MRN: [id number]; DOB: 27-Mar-1950 ? ?Admit date: 06/04/2021 ?Date of Consult: 06/07/2021 ? ?PCP:  Pcp, No ?  ?[facility] Providers ?Cardiologist:  None   (I   ? ? ?Patient Profile:  ? ?Louis Moore is a 71 y.o. male with acute hemorrhagic stroke who is being seen 06/07/2021 for the evaluation of atrial fib at the request of Dr. Leonie Man. ? ?History of Present Illness:  ? ?Louis Moore has no recent cardiac history but by report has not seen physicians regularly.  He was admitted 3/31 with code stroke.   acute intraparenchymal hemorrhage centered at the high posterior left frontal lobe with associated small volume regional subarachnoid hemorrhage. Intraventricular extension with blood within the left greater than right lateral ventricles.  On admission he was in hypertensive emergency.     Echo with EF of 60 - 65%.  No significant valvular abnormalities.   He has resultant right upper plegia and right lower weakness.   He was initially treated with Cleviprex.  He had SVT with beta blocker added.   He has had significant agitation.  Actually became bradycardic secondary to Precedex.   He has also been managed for hypernatremia.  ? ?Today noted to be in atrial fib with rapid ventricular rate.  Started on Cardizem. IV.   Currently not taking POs as his swallowing has worsened.    I did call his brother.  He reported some right hand tingling recently.  Otherwise no medical history.   ? ?Past Medical History:  ?Diagnosis Date  ? Medical history non-contributory   ? ? ?Past Surgical History:  ?Procedure Laterality Date  ? NO PAST SURGERIES    ?  ? ?Home Medications:  ?Prior to Admission medications   ?Not on File  ? ? ?Inpatient Medications: ?Scheduled Meds: ? Chlorhexidine Gluconate Cloth  6 each Topical Daily  ? feeding supplement (PROSource TF)  45 mL Per Tube BID  ? feeding supplement (VITAL HIGH PROTEIN)  1,000 mL Per Tube Q24H  ? pantoprazole (PROTONIX) IV  40 mg  Intravenous QHS  ? ?Continuous Infusions: ? clevidipine Stopped (06/07/21 1233)  ? dexmedetomidine (PRECEDEX) IV infusion Stopped (06/06/21 0617)  ? diltiazem (CARDIZEM) infusion 15 mg/hr (06/07/21 1700)  ? ?PRN Meds: ?acetaminophen **OR** acetaminophen (TYLENOL) oral liquid 160 mg/5 mL **OR** acetaminophen, food thickener, haloperidol, hydrALAZINE, labetalol ? ?Allergies:   No Known Allergies ? ?Social History:   ?Social History  ? ?Socioeconomic History  ? Marital status: Single  ?  Spouse name: Not on file  ? Number of children: Not on file  ? Years of education: Not on file  ? Highest education level: Not on file  ?Occupational History  ? Not on file  ?Tobacco Use  ? Smoking status: Never  ? Smokeless tobacco: Never  ?Substance and Sexual Activity  ? Alcohol use: Never  ? Drug use: Never  ? Sexual activity: Not on file  ?Other Topics Concern  ? Not on file  ?Social History Narrative  ? Not on file  ? ?Social Determinants of Health  ? ?Financial Resource Strain: Not on file  ?Food Insecurity: Not on file  ?Transportation Needs: Not on file  ?Physical Activity: Not on file  ?Stress: Not on file  ?Social Connections: Not on file  ?Intimate Partner Violence: Not on file  ?  ?Family History:   ?History reviewed. No pertinent family history.   Mother with MI at 71, died of MI age 20.  Father with  CHF.  ? ?ROS:  ?Please see the history of present illness.  ?Unable to assess.   ?All other ROS reviewed and negative.    ? ?Physical Exam/Data:  ? ?Vitals:  ? 06/07/21 1444 06/07/21 1500 06/07/21 1600 06/07/21 1700  ?BP:  133/74 (!) 153/77 (!) 156/72  ?Pulse: (!) 140 67 93 82  ?Resp: 19 (!) 28 (!) 22 (!) 22  ?Temp:   99.9 ?F (37.7 ?C)   ?TempSrc:   Axillary   ?SpO2: 95% 94% 94% 90%  ?Weight:      ? ? ?Intake/Output Summary (Last 24 hours) at 06/07/2021 1818 ?Last data filed at 06/07/2021 1700 ?Gross per 24 hour  ?Intake 844.95 ml  ?Output 1850 ml  ?Net -1005.05 ml  ? ? ?  06/04/2021  ? 12:00 AM  ?Last 3 Weights  ?Weight (lbs) 144  lb 10 oz  ?Weight (kg) 65.6 kg  ?   ?There is no height or weight on file to calculate BMI.  ?GENERAL:   ?NECK:  No jugular venous distention, waveform within normal limits, carotid upstroke brisk and symmetric, no bruits, no thyromegaly ?LYMPHATICS:  No cervical, inguinal adenopathy ?LUNGS:  Clear to auscultation bilaterally ?BACK:  No CVA tenderness ?CHEST:  Unremarkable ?HEART:  PMI not displaced or sustained,S1 and S2 within normal limits, no S3, no S4, no clicks, no rubs, no murmurs ?ABD:  Flat, positive bowel sounds normal in frequency in pitch, no bruits, no rebound, no guarding, no midline pulsatile mass, no hepatomegaly, no splenomegaly ?EXT:  2 plus pulses throughout, no edema, no cyanosis no clubbing ?SKIN:  No rashes no nodules ?NEURO:  Opens eyes and hold my hand.  Not following commands. Right hemiparesis.  ? ? ? ?EKG:  The EKG was personally reviewed and demonstrates:  Atrial fib with rapid ventricular rate.  Acute ST depression in the inferior and lateral leads with Mild ST elevation in V1 and aVR.   ?Telemetry:  Telemetry was personally reviewed and demonstrates:  NSR with PAF rapid rate.  ? ?Relevant CV Studies: ?1. Left ventricular ejection fraction, by estimation, is 60 to 65%. The  ?left ventricle has normal function. The left ventricle has no regional  ?wall motion abnormalities. Left ventricular diastolic parameters were  ?normal.  ? 2. Right ventricular systolic function is normal. The right ventricular  ?size is normal. Tricuspid regurgitation signal is inadequate for assessing  ?PA pressure.  ? 3. Left atrial size was mildly dilated.  ? 4. The mitral valve is grossly normal. Trivial mitral valve  ?regurgitation. No evidence of mitral stenosis.  ? 5. The aortic valve is grossly normal. There is mild calcification of the  ?aortic valve. There is mild thickening of the aortic valve. Aortic valve  ?regurgitation is mild. No aortic stenosis is present.  ? 6. The inferior vena cava is normal in  size with greater than 50%  ?respiratory variability, suggesting right atrial pressure of 3 mmHg.  ? ?Laboratory Data: ? ?High Sensitivity Troponin:  No results for input(s): TROPONINIHS in the last 720 hours.   ?Chemistry ?Recent Labs  ?Lab 06/05/21 ?1048 06/05/21 ?1452 06/06/21 ?0428 06/06/21 ?[id number] 06/07/21 ?0431 06/07/21 ?1001 06/07/21 ?1540  ?NA 143   < > 156*   < > 155* 156* 157*  ?K 3.9  --  3.8  --  3.5  --   --   ?CL 116*  --  125*  --  124*  --   --   ?CO2 18*  --  20*  --  19*  --   --   ?GLUCOSE 99  --  113*  --  98  --   --   ?BUN 16  --  20  --  22  --   --   ?CREATININE 1.02  --  1.15  --  1.05  --   --   ?CALCIUM 8.6*  --  8.5*  --  9.2  --   --   ?MG  --   --   --   --   --  2.7* 2.7*  ?GFRNONAA >60  --  >60  --  >60  --   --   ?ANIONGAP 9  --  11  --  12  --   --   ? < > = values in this interval not displayed.  ?  ?Recent Labs  ?Lab 06/04/21 ?0028  ?PROT 6.4*  ?ALBUMIN 3.6  ?AST 19  ?ALT 13  ?ALKPHOS 56  ?BILITOT 0.5  ? ?Lipids  ?Recent Labs  ?Lab 06/04/21 ?0028  ?CHOL 165  ?TRIG 46  ?HDL 38*  ?LDLCALC 118*  ?CHOLHDL 4.3  ?  ?Hematology ?Recent Labs  ?Lab 06/05/21 ?1048 06/06/21 ?0428 06/07/21 ?0431  ?WBC 18.0* 11.5* 16.3*  ?RBC 4.18* 3.62* 4.44  ?HGB 13.0 11.1* 13.8  ?HCT 37.7* 33.3* 40.6  ?MCV 90.2 92.0 91.4  ?MCH 31.1 30.7 31.1  ?MCHC 34.5 33.3 34.0  ?RDW 13.9 14.5 14.9  ?PLT 322 264 326  ? ?Thyroid No results for input(s): TSH, FREET4 in the last 168 hours.  ?BNPNo results for input(s): BNP, PROBNP in the last 168 hours.  ?DDimer No results for input(s): DDIMER in the last 168 hours. ? ? ?Radiology/Studies:  ?CT HEAD WO CONTRAST (5MM) ? ?Result Date: 06/05/2021 ?CLINICAL DATA:  71 year old male with large posterior left frontal lobe hemorrhage. EXAM: CT HEAD WITHOUT CONTRAST TECHNIQUE: Contiguous axial images were obtained from the base of the skull through the vertex without intravenous contrast. RADIATION DOSE REDUCTION: This exam was performed according to the departmental dose-optimization  program which includes automated exposure control, adjustment of the mA and/or kV according to patient size and/or use of iterative reconstruction technique. COMPARISON:  Brain MRI 06/04/2021 and earlier.

## 2021-06-07 NOTE — Evaluation (Addendum)
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 Stated on hypertonic saline, cleviprex.  No PMH as has not seen PCP in decades.   Clinical Impression   Pt was independent prior to admittance. Today he is overall max A for ADL UB and total A for LB ADL. He presents with receptive and expressive difficulties - although he did attempt to sing happy birthday with therapists and he moved his Left had mimicking a "talking" symbol. He followed approx 25% of commands. His RUE did not purposefully move today, did not respond to deep nail bed pressure. Vision is impaired and need to be further assessed - there is a right neglect present and he would not look to the right despite loud auditory stimulus. Pt very food motivated and with mod A able to spoon eggs into mouth. He did better chewing when eggs were on the left side of his mouth, but he ended up pocketing in the right. Egg was scooped out for safety. We did hand over hand to bring drink to lips but he did not drink. Able to perform sit<>stand x2 with max A +2. No family present today. OT will continue to follow acutely. At this time recommending AIR level therapy post-acute to maximize safety and independence in ADL and functional transfers as well as cognition, R inattention.       Recommendations for follow up therapy are one component of a multi-disciplinary discharge planning process, led by the attending physician.  Recommendations may be updated based on patient status, additional functional criteria and insurance authorization.   Follow Up Recommendations  Acute inpatient rehab (3hours/day)    Assistance Recommended at Discharge Frequent or constant Supervision/Assistance   Patient can return home with the following Two people to help with walking and/or transfers;A lot of help with bathing/dressing/bathroom;Assistance with feeding;Direct supervision/assist for medications management;Direct supervision/assist for financial management;Assist for transportation;Help with stairs or ramp for entrance    Functional Status Assessment  Patient has had a recent decline in their functional status and demonstrates the ability to make significant improvements in function in a reasonable and predictable amount of time.  Equipment Recommendations  BSC/3in1;Wheelchair (measurements OT);Wheelchair cushion (measurements OT);Hospital bed (defer to next venue of care)    Recommendations for Other Services Rehab consult;PT consult;Speech consult;Other (comment) (Palliative Medicine Consult)     Precautions / Restrictions Precautions Precautions: Fall Precaution Comments: R neglect, hemiparesis, watch for pocketing with feeding Restrictions Weight Bearing Restrictions: No      Mobility Bed Mobility Overal bed mobility: Needs Assistance Bed Mobility: Rolling, Sidelying to Sit Rolling: Max assist, +2 for physical assistance Sidelying to sit: +2 for physical assistance, Max assist Supine to sit: Max assist, HOB elevated Sit to supine: Total assist, +2 for physical assistance   General bed mobility comments: assisting with most aspect, pt helping to push up with L hand once legs off bed and initially lifting trunk, to supine assist for all aspects.    Transfers Overall transfer level: Needs assistance Equipment used: 2 person hand held assist Transfers: Sit to/from Stand Sit to Stand: Max assist, +2 physical assistance           General transfer comment: assist for L lateral weight shift, pt holding therapists arm with L UE  and R knee blocked, pt able to accept weight in standing on R LE and stood twice for about 30-60 seconds each time for bed linen change; returned  to supine for safety due to pt fatigue and for safety      Balance Overall balance assessment: Needs assistance Sitting-balance support: Feet supported Sitting balance-Leahy Scale: Zero Sitting balance - Comments: max A for sitting balance while attempted to eat breakfast; head and trunk tendency to lean R and when L hand on the bed pushing to R; pt placed hat on his head using L hand with min A   Standing balance support: Bilateral upper extremity supported Standing balance-Leahy Scale: Zero Standing balance comment: mod A of 2 for standing with support at R knee and L hand and behind hips stood x 2 for 30-60 seconds                           ADL either performed or assessed with clinical judgement   ADL Overall ADL's : Needs assistance/impaired Eating/Feeding: Sitting;Maximal assistance Eating/Feeding Details (indicate cue type and reason): pt attempting to grab utensils and did bring food to mouth, tremor in LUE noted (unsure if new or old), mod A for steadying assist and to scoop/spear eggs. hand over hand assist for bringing cup to mouth but did not drink Grooming: Maximal assistance   Upper Body Bathing: Maximal assistance   Lower Body Bathing: Total assistance   Upper Body Dressing : Maximal assistance   Lower Body Dressing: Total assistance   Toilet Transfer: Maximal assistance;+2 for physical assistance;+2 for safety/equipment   Toileting- Clothing Manipulation and Hygiene: Total assistance       Functional mobility during ADLs: Maximal assistance;+2 for physical assistance;+2 for safety/equipment General ADL Comments: deficits in cognition, R inattention, R hemi, expressive and receptive communication deficits     Vision Patient Visual Report: Other (comment) (unsure of baseline) Vision Assessment?: Vision impaired- to be further tested in functional context Additional Comments:  (R neglect impacting, not following commands enough to test accurately)      Perception     Praxis      Pertinent Vitals/Pain Pain Assessment Pain Assessment: Faces Faces Pain Scale: No hurt Pain Intervention(s): Monitored during session     Hand Dominance     Extremity/Trunk Assessment Upper Extremity Assessment Upper Extremity Assessment: RUE deficits/detail RUE Deficits / Details: PROM WFL, did not withdraw to pain/deep nailbed pressure RUE Sensation: decreased light touch RUE Coordination: decreased fine motor;decreased gross motor   Lower Extremity Assessment Lower Extremity Assessment: Defer to PT evaluation       Communication Communication Communication: Expressive difficulties (mumbles, occasional understandable word)   Cognition Arousal/Alertness: Awake/alert Behavior During Therapy: Impulsive, Restless Overall Cognitive Status: Difficult to assess Area of Impairment: Attention, Following commands, Safety/judgement                   Current Attention Level: Focused   Following Commands: Follows one step commands inconsistently (25% of the time) Safety/Judgement: Decreased awareness of deficits, Decreased awareness of safety     General Comments: automatic reaching for and attempting to eat eggs and hold and drink juice, but pocketing and unable to manage solids nor initiate sipping his juice.  attempted to sing "Happy Birthday" in standing when therapists singing     General Comments  VSS, BP < 160 throughout    Exercises     Shoulder Instructions      Home Living Family/patient  expects to be discharged to:: Private residence Living Arrangements: Other relatives (brother) Available Help at Discharge: Family Type of Home: Mobile home Home Access: Stairs to enter Entrance Stairs-Number of Steps: 6 Entrance Stairs-Rails: Right Home Layout: One level               Home Equipment: None          Prior Functioning/Environment Prior Level of Function : Independent/Modified Independent                         OT Problem List: Decreased strength;Decreased range of motion;Decreased activity tolerance;Impaired balance (sitting and/or standing);Impaired vision/perception;Decreased coordination;Decreased cognition;Decreased safety awareness;Decreased knowledge of use of DME or AE;Impaired sensation;Impaired UE functional use      OT Treatment/Interventions: Self-care/ADL training;Therapeutic exercise;Neuromuscular education;DME and/or AE instruction;Manual therapy;Therapeutic activities;Cognitive remediation/compensation;Visual/perceptual remediation/compensation;Balance training;Patient/family education    OT Goals(Current goals can be found in the care plan section) Acute Rehab OT Goals Patient Stated Goal: none stated OT Goal Formulation: Patient unable to participate in goal setting Time For Goal Achievement: 06/21/21 Potential to Achieve Goals: Fair ADL Goals Pt Will Perform Eating: with set-up;with adaptive utensils;sitting Pt Will Perform Grooming: with supervision;sitting Pt Will Perform Upper Body Dressing: sitting;with min guard assist Pt Will Perform Lower Body Dressing: with mod assist;with caregiver independent in assisting;sitting/lateral leans Pt Will Transfer to Toilet: with min assist;stand pivot transfer;bedside commode Pt Will Perform Toileting - Clothing Manipulation and hygiene: with mod assist;sit to/from stand Additional ADL Goal #1: Pt will attend to his right side of his body for ADL 50% of the time  OT Frequency: Min 2X/week    Co-evaluation PT/OT/SLP Co-Evaluation/Treatment: Yes Reason for Co-Treatment: Complexity of the patient's impairments (multi-system involvement);Necessary to address cognition/behavior during functional activity;For patient/therapist safety;To address functional/ADL transfers PT goals addressed during session: Mobility/safety with mobility;Balance;Strengthening/ROM OT goals addressed during session: ADL's and self-care;Strengthening/ROM       AM-PAC OT "6 Clicks" Daily Activity     Outcome Measure Help from another person eating meals?: A Lot Help from another person taking care of personal grooming?: A Lot Help from another person toileting, which includes using toliet, bedpan, or urinal?: Total Help from another person bathing (including washing, rinsing, drying)?: A Lot Help from another person to put on and taking off regular upper body clothing?: Total Help from another person to put on and taking off regular lower body clothing?: Total 6 Click Score: 9   End of Session Equipment Utilized During Treatment: Gait belt;Oxygen (2L) Nurse Communication: Mobility status  Activity Tolerance: Patient tolerated treatment well Patient left: in bed;with call bell/phone within reach;with bed alarm set  OT Visit Diagnosis: Unsteadiness on feet (R26.81);Other abnormalities of gait and mobility (R26.89);Muscle weakness (generalized) (M62.81);Other symptoms and signs involving the nervous system (R29.898);Other symptoms and signs involving cognitive function;Hemiplegia and hemiparesis Hemiplegia - Right/Left: Right Hemiplegia - dominant/non-dominant: Dominant Hemiplegia - caused by: Nontraumatic SAH                Time: 2130-8657 OT Time Calculation (min): 31 min Charges:  OT General Charges $OT Visit: 1 Visit OT Evaluation $OT Eval Moderate Complexity: 1 Mod  Nyoka Cowden OTR/L Acute Rehabilitation Services Pager: [phone] Office: [phone]  Evern Bio Leela Vanbrocklin 06/07/2021, 1:57 PM

## 2021-06-07 NOTE — Progress Notes (Signed)
Cortrak Tube Team Note: ? ?Consult received to place a Cortrak feeding tube. Tube placement unsuccessful and attempted by 2 Cortrak RD's. Floor RD reached out to team for attempt placement in fluoroscopy.  ? ? ? ?Kirby Crigler RD, LDN ?Clinical Dietitian ?See AMiON for contact information.  ? ? ?

## 2021-06-07 NOTE — Progress Notes (Signed)
Inpatient Rehab Admissions Coordinator:  ? ?Per therapy recommendations,  patient was screened for CIR candidacy by Tramond Slinker, MS, CCC-SLP. At this time, Pt. Appears to be a a potential candidate for CIR. I will place   order for rehab consult per protocol for full assessment. Please contact me any with questions. ? ?Isidore Margraf, MS, CCC-SLP ?Rehab Admissions Coordinator  ?[phone] (celll) ?[phone] (office) ? ?

## 2021-06-07 NOTE — Progress Notes (Addendum)
? ?  Patient seen earlier this evening by Dr. Antoine Poche. I was asked to follow-up on repeat EKG. As of now, EKG has not been done. I will call RN and ask this to be checked. I have signed out to overnight fellow (Dr. Joyce Gross) who will follow-up on this. ? ?Corrin Parker, PA-C ?06/07/2021 8:26 PM ? ? ?

## 2021-06-08 ENCOUNTER — Inpatient Hospital Stay (HOSPITAL_COMMUNITY): Payer: Medicare Other

## 2021-06-08 DIAGNOSIS — I610 Nontraumatic intracerebral hemorrhage in hemisphere, subcortical: Secondary | ICD-10-CM | POA: Diagnosis not present

## 2021-06-08 DIAGNOSIS — I4891 Unspecified atrial fibrillation: Secondary | ICD-10-CM | POA: Diagnosis not present

## 2021-06-08 DIAGNOSIS — E43 Unspecified severe protein-calorie malnutrition: Secondary | ICD-10-CM | POA: Insufficient documentation

## 2021-06-08 LAB — CBC
HCT: 41.2 % (ref 39.0–52.0)
Hemoglobin: 13.8 g/dL (ref 13.0–17.0)
MCH: 30.8 pg (ref 26.0–34.0)
MCHC: 33.5 g/dL (ref 30.0–36.0)
MCV: 92 fL (ref 80.0–100.0)
Platelets: 318 10*3/uL (ref 150–400)
RBC: 4.48 MIL/uL (ref 4.22–5.81)
RDW: 15.1 % (ref 11.5–15.5)
WBC: 14.8 10*3/uL — ABNORMAL HIGH (ref 4.0–10.5)
nRBC: 0 % (ref 0.0–0.2)

## 2021-06-08 LAB — GLUCOSE, CAPILLARY
Glucose-Capillary: 109 mg/dL — ABNORMAL HIGH (ref 70–99)
Glucose-Capillary: 105 mg/dL — ABNORMAL HIGH (ref 70–99)
Glucose-Capillary: 129 mg/dL — ABNORMAL HIGH (ref 70–99)
Glucose-Capillary: 133 mg/dL — ABNORMAL HIGH (ref 70–99)
Glucose-Capillary: 164 mg/dL — ABNORMAL HIGH (ref 70–99)

## 2021-06-08 LAB — BASIC METABOLIC PANEL
Anion gap: 10 (ref 5–15)
BUN: 35 mg/dL — ABNORMAL HIGH (ref 8–23)
CO2: 20 mmol/L — ABNORMAL LOW (ref 22–32)
Calcium: 9.1 mg/dL (ref 8.9–10.3)
Chloride: 129 mmol/L — ABNORMAL HIGH (ref 98–111)
Creatinine, Ser: 1.44 mg/dL — ABNORMAL HIGH (ref 0.61–1.24)
GFR, Estimated: 52 mL/min — ABNORMAL LOW (ref >60)
Glucose, Bld: 113 mg/dL — ABNORMAL HIGH (ref 70–99)
Potassium: 3.3 mmol/L — ABNORMAL LOW (ref 3.5–5.1)
Sodium: 159 mmol/L — ABNORMAL HIGH (ref 135–145)

## 2021-06-08 LAB — PHOSPHORUS
Phosphorus: 2.2 mg/dL — ABNORMAL LOW (ref 2.5–4.6)
Phosphorus: 3.6 mg/dL (ref 2.5–4.6)

## 2021-06-08 LAB — SODIUM
Sodium: 159 mmol/L — ABNORMAL HIGH (ref 135–145)
Sodium: 159 mmol/L — ABNORMAL HIGH (ref 135–145)
Sodium: 159 mmol/L — ABNORMAL HIGH (ref 135–145)

## 2021-06-08 LAB — MAGNESIUM
Magnesium: 2.9 mg/dL — ABNORMAL HIGH (ref 1.7–2.4)
Magnesium: 2.9 mg/dL — ABNORMAL HIGH (ref 1.7–2.4)

## 2021-06-08 MED ORDER — HALOPERIDOL LACTATE 5 MG/ML IJ SOLN
2.0000 mg | Freq: Four times a day (QID) | INTRAMUSCULAR | Status: DC | PRN
  Administered 2021-06-17: 2 mg via INTRAVENOUS
  Filled 2021-06-08: qty 1

## 2021-06-08 MED ORDER — BETHANECHOL CHLORIDE 10 MG PO TABS
10.0000 mg | ORAL_TABLET | Freq: Three times a day (TID) | ORAL | Status: DC
  Administered 2021-06-08 – 2021-06-14 (×18): 10 mg via ORAL
  Filled 2021-06-08 (×18): qty 1

## 2021-06-08 MED ORDER — PROSOURCE TF PO LIQD
90.0000 mL | Freq: Two times a day (BID) | ORAL | Status: DC
  Administered 2021-06-08 – 2021-06-18 (×18): 90 mL
  Filled 2021-06-08 (×19): qty 90

## 2021-06-08 MED ORDER — POTASSIUM CHLORIDE 10 MEQ/100ML IV SOLN
10.0000 meq | INTRAVENOUS | Status: AC
  Administered 2021-06-08 (×4): 10 meq via INTRAVENOUS
  Filled 2021-06-08: qty 100

## 2021-06-08 MED ORDER — ENOXAPARIN SODIUM 40 MG/0.4ML IJ SOSY
40.0000 mg | PREFILLED_SYRINGE | INTRAMUSCULAR | Status: DC
  Administered 2021-06-08 – 2021-06-18 (×10): 40 mg via SUBCUTANEOUS
  Filled 2021-06-08 (×10): qty 0.4

## 2021-06-08 MED ORDER — POTASSIUM PHOSPHATES 15 MMOLE/5ML IV SOLN
15.0000 mmol | Freq: Once | INTRAVENOUS | Status: AC
  Administered 2021-06-08: 15 mmol via INTRAVENOUS
  Filled 2021-06-08: qty 5

## 2021-06-08 MED ORDER — THIAMINE HCL 100 MG PO TABS
100.0000 mg | ORAL_TABLET | Freq: Every day | ORAL | Status: DC
  Administered 2021-06-08 – 2021-06-18 (×11): 100 mg
  Filled 2021-06-08 (×10): qty 1

## 2021-06-08 MED ORDER — LABETALOL HCL 5 MG/ML IV SOLN
10.0000 mg | INTRAVENOUS | Status: DC | PRN
Start: 2021-06-08 — End: 2021-06-18
  Administered 2021-06-11: 10 mg via INTRAVENOUS
  Filled 2021-06-08: qty 4

## 2021-06-08 MED ORDER — PANTOPRAZOLE 2 MG/ML SUSPENSION
40.0000 mg | Freq: Every day | ORAL | Status: DC
  Administered 2021-06-08 – 2021-06-17 (×10): 40 mg
  Filled 2021-06-08 (×10): qty 20

## 2021-06-08 MED ORDER — DILTIAZEM HCL 30 MG PO TABS
120.0000 mg | ORAL_TABLET | Freq: Four times a day (QID) | ORAL | Status: DC
  Administered 2021-06-08 – 2021-06-12 (×16): 120 mg via ORAL
  Filled 2021-06-08 (×3): qty 4
  Filled 2021-06-08: qty 2
  Filled 2021-06-08 (×2): qty 4
  Filled 2021-06-08: qty 2
  Filled 2021-06-08 (×4): qty 4
  Filled 2021-06-08: qty 2
  Filled 2021-06-08 (×2): qty 4
  Filled 2021-06-08 (×2): qty 2

## 2021-06-08 MED ORDER — OSMOLITE 1.5 CAL PO LIQD
1000.0000 mL | ORAL | Status: DC
  Administered 2021-06-08 – 2021-06-17 (×6): 1000 mL
  Filled 2021-06-08 (×4): qty 1000

## 2021-06-08 NOTE — Progress Notes (Signed)
Speech Language Pathology Treatment: Dysphagia  ?Patient Details ?Name: Louis Moore ?MRN: [id number] ?DOB: [birth year] ?Today's Date: 06/08/2021 ?Time: 4765-4650 ?SLP Time Calculation (min) (ACUTE ONLY): 15 min ? ?Assessment / Plan / Recommendation ?Clinical Impression ? Large bore NG tube now in place.  Pt did not have any PO intake today, and was not sufficiently alert for POs during our session.  Cleaned face with wet cloth in attempt to rouse; pt opened eyes, maintained open mouth posture; did not verbalize at all during our session nor follow commands. Oral care provided and secretions suctioned from mouth.  No spontaneous swallowing noted.  If alertness improves, allow POs; otherwise, hold tray. SLP will continue to follow for swallowing/aphasia.    ?HPI HPI: 71 y.o. male with no significant PMH but has not seen a PCP in decades admitted with aphasia and right sided weakness. Dx acute Intraparenchymal hemorrhage in the high L posterior frontal lobe with associated cerebral edema, mass effect, midline shift and brain compression. ?  ?   ?SLP Plan ? Continue with current plan of care ? ?  ?  ?Recommendations for follow up therapy are one component of a multi-disciplinary discharge planning process, led by the attending physician.  Recommendations may be updated based on patient status, additional functional criteria and insurance authorization. ?  ? ?Recommendations  ?Diet recommendations: Dysphagia 1 (puree);Nectar-thick liquid ?Liquids provided via: Cup ?Medication Administration: Via alternative means ?Supervision: Full supervision/cueing for compensatory strategies ?Compensations: Minimize environmental distractions;Slow rate;Small sips/bites;Lingual sweep for clearance of pocketing ?Postural Changes and/or Swallow Maneuvers: Seated upright 90 degrees  ?   ?    ?   ? ? ? ? Oral Care Recommendations: Oral care BID ?Follow Up Recommendations: Acute inpatient rehab (3hours/day)- if LOA improves ?Assistance  recommended at discharge: Frequent or constant Supervision/Assistance ?SLP Visit Diagnosis: Dysphagia, oropharyngeal phase (R13.12);Aphasia (R47.01) ?Plan: Continue with current plan of care ? ? ? ? ?  ?  ? ?Louis Roell L. Tenecia Ignasiak, MA CCC/SLP ?Acute Rehabilitation Services ?Office number [phone] ?Pager [phone] ? ?Louis Moore ? ?06/08/2021, 1:55 PM ?

## 2021-06-08 NOTE — Progress Notes (Signed)
Inpatient Rehab Admissions Coordinator:  ? ?Met with pt at bedside to discuss CIR.  He does not respond to questioning, follows only 1 command for head turn L.  Note posey belt and LUE restraint.  No active movement in RUE to command or otherwise.  Max/total +2 for transfers at this time.  Will likely need significant caregiver support at discharge, unless he progresses.  I will follow for 1-2 more therapy sessions for tolerance.  ? ?Shann Medal, PT, DPT ?Admissions Coordinator ?[phone] ?06/08/21  ?12:56 PM ? ?

## 2021-06-08 NOTE — Progress Notes (Signed)
? ?Progress Note ? ?Patient Name: Louis Moore ?Date of Encounter: 06/08/2021 ? ?Primary Cardiologist:   None ? ? ?Subjective  ? ?More awake today.  Seems to indicate no pain.   ? ?Inpatient Medications  ?  ?Scheduled Meds: ? Chlorhexidine Gluconate Cloth  6 each Topical Daily  ? diltiazem  120 mg Oral Q6H  ? enoxaparin (LOVENOX) injection  40 mg Subcutaneous Q24H  ? feeding supplement (PROSource TF)  45 mL Per Tube BID  ? feeding supplement (VITAL HIGH PROTEIN)  1,000 mL Per Tube Q24H  ? pantoprazole (PROTONIX) IV  40 mg Intravenous QHS  ? ?Continuous Infusions: ? clevidipine Stopped (06/07/21 1233)  ? dexmedetomidine (PRECEDEX) IV infusion Stopped (06/06/21 0617)  ? diltiazem (CARDIZEM) infusion 15 mg/hr (06/08/21 0800)  ? potassium chloride    ? potassium PHOSPHATE IVPB (in mmol) 15 mmol (06/08/21 0911)  ? ?PRN Meds: ?acetaminophen **OR** acetaminophen (TYLENOL) oral liquid 160 mg/5 mL **OR** acetaminophen, food thickener, haloperidol lactate, hydrALAZINE, labetalol  ? ?Vital Signs  ?  ?Vitals:  ? 06/08/21 0600 06/08/21 0700 06/08/21 0800 06/08/21 1102  ?BP: (!) 159/75 (!) 149/64 (!) 148/69 (!) 169/74  ?Pulse: 83 71 72   ?Resp: (!) 25 20 20    ?Temp:   99.8 ?F (37.7 ?C)   ?TempSrc:   Axillary   ?SpO2: 95% 92% 95%   ?Weight:      ? ? ?Intake/Output Summary (Last 24 hours) at 06/08/2021 1104 ?Last data filed at 06/08/2021 0911 ?Gross per 24 hour  ?Intake 435.76 ml  ?Output 850 ml  ?Net -414.24 ml  ? ?Filed Weights  ? 06/04/21 0000  ?Weight: 65.6 kg  ? ? ?Telemetry  ?  ?NSR - Personally Reviewed ? ?ECG  ?  ?NSR, rate 88, axis WNL, intervals WNL, no acute ST T wave changes - Personally Reviewed ? ?Physical Exam  ? ?GEN: No acute distress.   ?Neck: No  JVD ?Cardiac: RRR, no murmurs, rubs, or gallops.  ?Respiratory:     Decreased breath sounds ?GI: Soft, nontender, non-distended  ?MS: No  edema; No deformity. ?Neuro:  Right hemiparesis  ?Psych: Unable to assess.  ? ?Labs  ?  ?Chemistry ?Recent Labs  ?Lab 06/04/21 ?0028  06/04/21 ?[id number] 06/06/21 ?[id number] 06/06/21 ?[id number] 06/07/21 ?[id number] 06/07/21 ?1001 06/07/21 ?2121 06/08/21 ?[id number] 06/08/21 ?0949  ?NA 139   < > 156*   < > 155*   < > 157* 159* 159*  ?K 3.8   < > 3.8  --  3.5  --   --  3.3*  --   ?CL 110   < > 125*  --  124*  --   --  129*  --   ?CO2 22   < > 20*  --  19*  --   --  20*  --   ?GLUCOSE 120*   < > 113*  --  98  --   --  113*  --   ?BUN 13   < > 20  --  22  --   --  35*  --   ?CREATININE 1.00   < > 1.15  --  1.05  --   --  1.44*  --   ?CALCIUM 8.6*   < > 8.5*  --  9.2  --   --  9.1  --   ?PROT 6.4*  --   --   --   --   --   --   --   --   ?ALBUMIN 3.6  --   --   --   --   --   --   --   --   ?  AST 19  --   --   --   --   --   --   --   --   ?ALT 13  --   --   --   --   --   --   --   --   ?ALKPHOS 56  --   --   --   --   --   --   --   --   ?BILITOT 0.5  --   --   --   --   --   --   --   --   ?GFRNONAA >60   < > >60  --  >60  --   --  52*  --   ?ANIONGAP 7   < > 11  --  12  --   --  10  --   ? < > = values in this interval not displayed.  ?  ? ?Hematology ?Recent Labs  ?Lab 06/06/21 ?0428 06/07/21 ?0431 06/08/21 ?0319  ?WBC 11.5* 16.3* 14.8*  ?RBC 3.62* 4.44 4.48  ?HGB 11.1* 13.8 13.8  ?HCT 33.3* 40.6 41.2  ?MCV 92.0 91.4 92.0  ?MCH 30.7 31.1 30.8  ?MCHC 33.3 34.0 33.5  ?RDW 14.5 14.9 15.1  ?PLT 264 326 318  ? ? ?Cardiac EnzymesNo results for input(s): TROPONINI in the last 168 hours. No results for input(s): TROPIPOC in the last 168 hours.  ? ?BNPNo results for input(s): BNP, PROBNP in the last 168 hours.  ? ?DDimer No results for input(s): DDIMER in the last 168 hours.  ? ?Radiology  ?  ?DG Abd Portable 1V ? ?Result Date: 06/08/2021 ?CLINICAL DATA:  Status post nasogastric tube placement. EXAM: PORTABLE ABDOMEN - 1 VIEW COMPARISON:  None. FINDINGS: Nasogastric tube with tip and side port overlying the stomach, there is an acute angulation of the tube at the side port which may affect tube functionality. The bowel gas pattern is normal. No radio-opaque calculi or other significant  radiographic abnormality are seen. IMPRESSION: Nasogastric tube with tip and side port overlying the stomach, there is an acute angulation of the tube at the side port which may affect tube functionality. Electronically Signed   By: Dahlia Bailiff M.D.   On: 06/08/2021 10:14   ? ?Cardiac Studies  ? ?ECHO:     1. Left ventricular ejection fraction, by estimation, is 60 to 65%. The  ?left ventricle has normal function. The left ventricle has no regional  ?wall motion abnormalities. Left ventricular diastolic parameters were  ?normal.  ? 2. Right ventricular systolic function is normal. The right ventricular  ?size is normal. Tricuspid regurgitation signal is inadequate for assessing  ?PA pressure.  ? 3. Left atrial size was mildly dilated.  ? 4. The mitral valve is grossly normal. Trivial mitral valve  ?regurgitation. No evidence of mitral stenosis.  ? 5. The aortic valve is grossly normal. There is mild calcification of the  ?aortic valve. There is mild thickening of the aortic valve. Aortic valve  ?regurgitation is mild. No aortic stenosis is present.  ? 6. The inferior vena cava is normal in size with greater than 50%  ?respiratory variability, suggesting right atrial pressure of 3 mmHg.  ? ?Patient Profile  ?   ?71 y.o. male without prior cardiac history presents with hemorrhagic stroke.  We are seeing because of PAF with rapid rate.  ? ?Assessment & Plan  ?  ?Atrial fib with RVR:    Maintaining NSR.  If he goes back into rapid atrial fib we can use amio IV then PO.  For now continue Cardizem via tube.  ?  ?Acute intraparenchymal hemorrhage L posterior frontal lobe:    No anticoagulation.   ?  ?HTN:  BP is maintained at acceptable levels with recent stroke using the Cardizem IV.   ?  ?Abnormal EKG:    ST changes returned to baseline  ?  ?Dyslipidemia:    Crestor started this admission. ? ? ?For questions or updates, please contact [facility] ?Please consult [email or web address] for contact info under  Cardiology/STEMI. ?  ?Signed, ?Minus Breeding, MD  ?06/08/2021, 11:04 AM   ? ?

## 2021-06-08 NOTE — Progress Notes (Signed)
Initial Nutrition Assessment ? ?DOCUMENTATION CODES:  ? ?Severe malnutrition in context of chronic illness ? ?INTERVENTION:  ? ?Initiate tube feeding via NG tube: ?Osmolite 1.5 at 20 ml/h and increase by 10 ml every 8 hours to goal rate of 50 ml/hr (1200 ml per day) ?Prosource TF 90 ml BID ? ?Provides 1960 kcal, 119 gm protein, 912 ml free water daily ? ?100 mg Thiamine daily ? ?Monitor magnesium and phosphorus every 12 hours x 4 occurrences, MD to replete as needed, as pt is at risk for refeeding syndrome given severe malnutrition. ? ? ?NUTRITION DIAGNOSIS:  ? ?Severe Malnutrition related to chronic illness (uncontrolled HTN) as evidenced by severe muscle depletion, severe fat depletion. ? ?GOAL:  ? ?Patient will meet greater than or equal to 90% of their needs ? ?MONITOR:  ? ?TF tolerance, PO intake, Diet advancement ? ?REASON FOR ASSESSMENT:  ? ?Consult ?Enteral/tube feeding initiation and management ? ?ASSESSMENT:  ? ?Pt with no PMH but has not seen a PCP in decades admitted with L frontal ICH presumably from uncontrolled HTN with mild cytotoxic edema. New onset Afib, HTN, HLD.  ? ?Pt discussed during ICU rounds and with RN. 4/3 pt developed HR 180s sustained and afib with RVR. Cardizem ordered, cardiology now following. Pt with large bore NG tube in place. Pt not alert enough for POs. SLP following.  ? ?4/3 cortrak placement unsuccessful ?4/4 NG tube placement, tip gastric  ?  ?Medications reviewed and include: protonix ?KCl x 4  ? ?Labs reviewed: Na 159, K: 3.3, PO4: 2.2, Magnesium: 2.9 ? ?CBG's: 104-129 ? ?17F NG tube; tip gastric per xray ? ?NUTRITION - FOCUSED PHYSICAL EXAM: ? ?Flowsheet Row Most Recent Value  ?Orbital Region Severe depletion  ?Upper Arm Region Severe depletion  ?Thoracic and Lumbar Region Severe depletion  ?Buccal Region Severe depletion  ?Temple Region Severe depletion  ?Clavicle Bone Region Severe depletion  ?Clavicle and Acromion Bone Region Severe depletion  ?Dorsal Hand Severe  depletion  ?Patellar Region Severe depletion  ?Anterior Thigh Region Severe depletion  ?Posterior Calf Region Severe depletion  ?Edema (RD Assessment) None  ?Hair Reviewed  ?Eyes Reviewed  ?Mouth Reviewed  ?Skin Reviewed  ?Nails Reviewed  ? ?  ? ? ?Diet Order:   ?Diet Order   ? ?       ?  DIET - DYS 1 Room service appropriate? Yes; Fluid consistency: Nectar Thick  Diet effective now       ?  ? ?  ?  ? ?  ? ? ?EDUCATION NEEDS:  ? ?Not appropriate for education at this time ? ?Skin:  Skin Assessment:  (abrasions) ? ?Last BM:  unknown ? ?Height:  ? ?Ht Readings from Last 1 Encounters:  ?No data found for Ht  ? ? ?Weight:  ? ?Wt Readings from Last 1 Encounters:  ?06/04/21 65.6 kg  ? ? ?BMI:  There is no height or weight on file to calculate BMI. ? ?Estimated Nutritional Needs:  ? ?Kcal:  1900-2100 ? ?Protein:  105-115 grams ? ?Fluid:  2 L/day ? ?Cammy Copa., RD, LDN, CNSC ?See AMiON for contact information  ? ?

## 2021-06-08 NOTE — Progress Notes (Addendum)
STROKE TEAM PROGRESS NOTE  ? ?INTERVAL HISTORY ?Cardizem at 15 cc per hour, cleviprex off, electrolytes replaced this morning, NG placed today. Cardizem transitioned to PO 120mg  q6hrs ?Cardiology consulted to assist in management of AF. CHA2DS2-VASc Score =   3 but patient  currently not a candidate for anticoagulation given acute intracerebral hemorrhage ?Blood pressure adequately controlled.  Neurological exam remains drowsy but can be aroused and follows simple commands on the left and remains plegic on the right ? ?Vitals:  ? 06/08/21 0400 06/08/21 0500 06/08/21 0600 06/08/21 0800  ?BP: (!) 160/73 (!) 157/75 (!) 159/75   ?Pulse: 94 93 83   ?Resp: (!) 28 15 (!) 25   ?Temp: 99.9 ?F (37.7 ?C)   99.8 ?F (37.7 ?C)  ?TempSrc: Axillary   Axillary  ?SpO2: 95% 90% 95%   ?Weight:      ? ?CBC:  ?Recent Labs  ?Lab 06/04/21 ?0028 06/04/21 ?[id number] 06/07/21 ?0431 06/08/21 ?0319  ?WBC 17.6*   < > 16.3* 14.8*  ?NEUTROABS 14.3*  --   --   --   ?HGB 13.0   < > 13.8 13.8  ?HCT 37.0*   < > 40.6 41.2  ?MCV 90.5   < > 91.4 92.0  ?PLT 283   < > 326 318  ? < > = values in this interval not displayed.  ? ? ?Basic Metabolic Panel:  ?Recent Labs  ?Lab 06/07/21 ?0431 06/07/21 ?1001 06/07/21 ?1540 06/07/21 ?2121 06/08/21 ?0319  ?NA 155*   < > 157* 157* 159*  ?K 3.5  --   --   --  3.3*  ?CL 124*  --   --   --  129*  ?CO2 19*  --   --   --  20*  ?GLUCOSE 98  --   --   --  113*  ?BUN 22  --   --   --  35*  ?CREATININE 1.05  --   --   --  1.44*  ?CALCIUM 9.2  --   --   --  9.1  ?MG  --    < > 2.7*  --  2.9*  ?PHOS  --    < > 2.2*  --  2.2*  ? < > = values in this interval not displayed.  ? ? ?Lipid Panel:  ?Recent Labs  ?Lab 06/04/21 ?0028  ?CHOL 165  ?TRIG 46  ?HDL 38*  ?CHOLHDL 4.3  ?VLDL 9  ?[place] 118*  ? ? ?HgbA1c:  ?Recent Labs  ?Lab 06/04/21 ?0028  ?HGBA1C 5.6  ? ? ?Urine Drug Screen:  ?Recent Labs  ?Lab 06/05/21 ?0224  ?LABOPIA NONE DETECTED  ?COCAINSCRNUR NONE DETECTED  ?LABBENZ NONE DETECTED  ?AMPHETMU NONE DETECTED  ?THCU NONE DETECTED   ?LABBARB NONE DETECTED  ? ? ?Alcohol Level No results for input(s): ETH in the last 168 hours. ? ?IMAGING past 24 hours ?No results found. ? ?PHYSICAL EXAM ?Constitutional: Elderly white male who is not in distress but mildly agitated this morning ?Eyes: No scleral injection ?Respiratory: Effort normal, non-labored breathing ?Gait and Station: Deferred ? ?Neuro: ?PERRL, drowsy but can be easily aroused.  Will intermittently follow only a few midline commands, expressive aphasia.  Will move LUE and BLE spontaneously, no movement of RUE.  Tongue midline ? ?ASSESSMENT/PLAN ?Louis Moore is a 71 y.o. male with history of not going to PCP in decades presented (3/31) for right sided hemiplegia and numbness after 1 day of hand tingling. Admitted for left high frontal ICH.  12hrs MRI showed ICH stable without hydrocephalus or herniation. CT head on 4/1 shows stable ICH. Patient in SVT vs afib w/ RVR, cardiology consulted on 4/3. NG placed 4/4, Cardizem transitioned to PO.  ?  ? ?Stroke: Left high frontal parenchymal ICH presumably from uncontrolled hypertension with mild cytotoxic edema and trace intraventricular extension but no hydrocephalus ?code Stroke CT head:  Acute intraparenchymal hemorrhage centered at the high posterior left frontal lobe measures 4.0 x 3.2 x 4.5 cm. Surrounding low-density vasogenic edema. Associated small volume subarachnoid hemorrhage within the adjacent left frontal region. Intraventricular extension with blood seen within the left greater than right lateral ventricles. ICH volume ~ 29 mL ?CT head at 3hrs and 8hrs after: Stable ICH ?MRI: Stable ICH, no hydrocephalus, no herniations ?MRA: No large vessel stenosis or occlusion.  No aneurysms.   ?2D Echo: Left ventricular ejection fraction 60-65%.   ?LDL 118  (06/04/2021) ?HgbA1c 5.6 (06/04/2021)  ?VTE prophylaxis - Scd's start: 06/04/21 0052, Sequential compression devices, below knee,  ?Diet: Dysph 3 ?No antithrombotic prior to admission, no  antithrombotic now secondary to [place] ?Therapy recommendations: CIR ?Disposition: Acute inpatient rehab (3hours/day) Pending ? ?New Onset Atrial fibrillation ?EKG shows afib w/ RVR ?Cardizem gtt with one time IVP ordered for rate control ?Cardiology consulted  ?Infusion transitioned to PO Cardizem on 4/4. PRN medication for rate control ordered.  ?120mg  q6hrs ? ?Hypertension ?Home meds: N/A  ?Unstable, still requiring cleviprex ?Long-term BP goal normotensive ?PRN labetalol, hydralazine  ?SBP goal <160  ? ?Hyperlipidemia ?Home meds:  N/A ?LDL 118  (06/04/2021), goal < 70  ?Will add statin at discharge ? ?Other Stroke Risk Factors ?Advanced Age >/= 60  ?Family hx stroke - family history is not on file.  ?Has not seen PCP in decades ?UDS pending ? ?Other Active Problems ?SVT vs afib?  ?Patient had an episode of SVT with rate to the 180s on 4/1.  This resolved with labetalol administration ?Agitation ?Clonidine, seroquel, haldol  ?Precedex resulted in bradycardia with HR 30 ?Delirium precautions  ? ? ?Hospital day # 4 ? ?Patient seen and examined by NP/APP with MD. MD to update note as needed.  ? ?Janine Ores, DNP, FNP-BC ?[facility] ?Pager: [phone] ? ?I have personally obtained history,examined this patient, reviewed notes, independently viewed imaging studies, participated in medical decision making and plan of care.ROS completed by me personally and pertinent positives fully documented  I have made any additions or clarifications directly to the above note. Agree with note above.  Patient neurological exam remains unchanged with aphasia and dense right hemiplegia but is able to follow few simple midline commands and commands on the left.  Continue strict blood pressure control with systolic goal below 0000000.  Start blood pressure medications through NG tube as well as Cardizem and taper Cardizem drip as tolerated.  Appreciate cardiology help.  Patient is not a candidate for anticoagulation at the  present time given acute intracerebral hemorrhage.  Mobilize out of bed.  Therapy consults.  No family available at the bedside.This patient is critically ill and at significant risk of neurological worsening, death and care requires constant monitoring of vital signs, hemodynamics,respiratory and cardiac monitoring, extensive review of multiple databases, frequent neurological assessment, discussion with family, other specialists and medical decision making of high complexity.I have made any additions or clarifications directly to the above note.This critical care time does not reflect procedure time, or teaching time or supervisory time of PA/NP/Med Resident etc but could involve care discussion time. ?  I spent 35 minutes of neurocritical care time  in the care of  this patient. ? ?  ? ? ?Antony Contras, MD ?Medical Director ?[facility] ?Pager: [phone] ?06/08/2021 2:58 PM ? ? ?

## 2021-06-08 NOTE — Progress Notes (Signed)
Physical Therapy Treatment ?Patient Details ?Name: Louis Moore ?MRN: [id number] ?DOB: 06-Feb-1951 ?Today's Date: 06/08/2021 ? ? ?History of Present Illness Patient is a 71 y/o male admitted with R hemiplegia and numbness found to have high L frontal ICH w/ mild cytotoxic edema, trace IV extension and 79mm midline shift. Stated on hypertonic saline, cleviprex.  No PMH as has not seen PCP in decades. ? ?  ?PT Comments  ? ? Patient with slow progress still pushing significantly to R.  Feel limited by prolonged bedrest needing restraints to keep from pulling lines out (now with NGT).  Motioned for chair today but not safe yet for up in chair so left in chair position.  Feel continued skilled PT indicated to progress mobility and safety and awareness.  Continue to recommend acute inpatient rehab at d/c.   ?Recommendations for follow up therapy are one component of a multi-disciplinary discharge planning process, led by the attending physician.  Recommendations may be updated based on patient status, additional functional criteria and insurance authorization. ? ?Follow Up Recommendations ? Acute inpatient rehab (3hours/day) ?  ?  ?Assistance Recommended at Discharge Frequent or constant Supervision/Assistance  ?Patient can return home with the following Two people to help with walking and/or transfers;Two people to help with bathing/dressing/bathroom;Assistance with cooking/housework;Direct supervision/assist for medications management;Assist for transportation;Help with stairs or ramp for entrance;Direct supervision/assist for financial management;Assistance with feeding ?  ?Equipment Recommendations ? Other (comment) (TBD)  ?  ?Recommendations for Other Services Rehab consult ? ? ?  ?Precautions / Restrictions Precautions ?Precautions: Fall ?Precaution Comments: R neglect, hemiparesis, NG tube  ?  ? ?Mobility ? Bed Mobility ?Overal bed mobility: Needs Assistance ?Bed Mobility: Supine to Sit ?  ?  ?Supine to sit: Max  assist, HOB elevated ?Sit to supine: +2 for physical assistance, Mod assist ?  ?General bed mobility comments: assist for legs off bed and to lift trunk pt pushing some with L UE on the way up; to supine assist for legs and repositioning trunk ?  ? ?Transfers ?Overall transfer level: Needs assistance ?Equipment used: 2 person hand held assist ?Transfers: Sit to/from Stand ?Sit to Stand: Max assist, +2 physical assistance ?  ?  ?  ?  ?  ?General transfer comment: up to stand with R knee blocked x 2 trials with pt remaining flexed, though keeping head up, support at R knee and R hip with cues for trunk extension ?  ? ?Ambulation/Gait ?  ?  ?  ?  ?  ?  ?  ?  ? ? ?Stairs ?  ?  ?  ?  ?  ? ? ?Wheelchair Mobility ?  ? ?Modified Rankin (Stroke Patients Only) ?Modified Rankin (Stroke Patients Only) ?Pre-Morbid Rankin Score: No symptoms ?Modified Rankin: Severe disability ? ? ?  ?Balance Overall balance assessment: Needs assistance ?Sitting-balance support: Feet supported ?Sitting balance-Leahy Scale: Zero ?Sitting balance - Comments: pushing R for about 70% of time seated EOB; when distracted to use L UE for something else less pushing and mod A for balance; othewise max A and needing help at times for head righting ?Postural control: Right lateral lean ?Standing balance support: Bilateral upper extremity supported ?Standing balance-Leahy Scale: Zero ?Standing balance comment: max A or 2 for balance with R hip and knee support and cues for upright posture, stood about 20-30 sec at a time ?  ?  ?  ?  ?  ?  ?  ?  ?  ?  ?  ?  ? ?  ?  Cognition Arousal/Alertness: Awake/alert ?Behavior During Therapy: Restless ?Overall Cognitive Status: Difficult to assess ?Area of Impairment: Attention, Following commands, Safety/judgement ?  ?  ?  ?  ?  ?  ?  ?  ?  ?Current Attention Level: Sustained ?  ?Following Commands: Follows one step commands inconsistently ?Safety/Judgement: Decreased awareness of deficits, Decreased awareness of safety ?   ?  ?  ?  ?  ? ?  ?Exercises   ? ?  ?General Comments General comments (skin integrity, edema, etc.): VSS throughout ?  ?  ? ?Pertinent Vitals/Pain Pain Assessment ?Faces Pain Scale: No hurt  ? ? ?Home Living   ?  ?  ?  ?  ?  ?  ?  ?  ?  ?   ?  ?Prior Function    ?  ?  ?   ? ?PT Goals (current goals can now be found in the care plan section) Progress towards PT goals: Progressing toward goals ? ?  ?Frequency ? ? ?   ? ? ? ?  ?PT Plan Current plan remains appropriate  ? ? ?Co-evaluation   ?  ?  ?  ?  ? ?  ?AM-PAC PT "6 Clicks" Mobility   ?Outcome Measure ? Help needed turning from your back to your side while in a flat bed without using bedrails?: A Lot ?Help needed moving from lying on your back to sitting on the side of a flat bed without using bedrails?: A Lot ?Help needed moving to and from a bed to a chair (including a wheelchair)?: Total ?Help needed standing up from a chair using your arms (e.g., wheelchair or bedside chair)?: Total ?Help needed to walk in hospital room?: Total ?Help needed climbing 3-5 steps with a railing? : Total ?6 Click Score: 8 ? ?  ?End of Session Equipment Utilized During Treatment: Gait belt ?Activity Tolerance: Patient limited by fatigue ?Patient left: in bed;with call bell/phone within reach;with restraints reapplied ?  ?PT Visit Diagnosis: Other abnormalities of gait and mobility (R26.89);Hemiplegia and hemiparesis;Other symptoms and signs involving the nervous system (R29.898) ?Hemiplegia - Right/Left: Right ?Hemiplegia - dominant/non-dominant: Dominant ?Hemiplegia - caused by: Nontraumatic intracerebral hemorrhage ?  ? ? ?Time: [id number] ?PT Time Calculation (min) (ACUTE ONLY): 28 min ? ?Charges:  $Therapeutic Activity: 23-37 mins          ?          ? ?Magda Kiel, PT ?Acute Rehabilitation Services ?[id number] ?Office:[phone] ?06/08/2021 ? ? ? ?[name] ?06/08/2021, 5:42 PM ? ?

## 2021-06-09 ENCOUNTER — Inpatient Hospital Stay (HOSPITAL_COMMUNITY): Payer: Medicare Other

## 2021-06-09 ENCOUNTER — Encounter (HOSPITAL_COMMUNITY): Payer: Self-pay | Admitting: Neurology

## 2021-06-09 DIAGNOSIS — I480 Paroxysmal atrial fibrillation: Secondary | ICD-10-CM

## 2021-06-09 DIAGNOSIS — I610 Nontraumatic intracerebral hemorrhage in hemisphere, subcortical: Secondary | ICD-10-CM | POA: Diagnosis not present

## 2021-06-09 LAB — CBC
HCT: 41.3 % (ref 39.0–52.0)
Hemoglobin: 13.9 g/dL (ref 13.0–17.0)
MCH: 31.3 pg (ref 26.0–34.0)
MCHC: 33.7 g/dL (ref 30.0–36.0)
MCV: 93 fL (ref 80.0–100.0)
Platelets: 318 10*3/uL (ref 150–400)
RBC: 4.44 MIL/uL (ref 4.22–5.81)
RDW: 15.4 % (ref 11.5–15.5)
WBC: 16.5 10*3/uL — ABNORMAL HIGH (ref 4.0–10.5)
nRBC: 0 % (ref 0.0–0.2)

## 2021-06-09 LAB — BASIC METABOLIC PANEL
Anion gap: 7 (ref 5–15)
BUN: 40 mg/dL — ABNORMAL HIGH (ref 8–23)
CO2: 22 mmol/L (ref 22–32)
Calcium: 8.9 mg/dL (ref 8.9–10.3)
Chloride: 129 mmol/L — ABNORMAL HIGH (ref 98–111)
Creatinine, Ser: 1.51 mg/dL — ABNORMAL HIGH (ref 0.61–1.24)
GFR, Estimated: 49 mL/min — ABNORMAL LOW (ref >60)
Glucose, Bld: 215 mg/dL — ABNORMAL HIGH (ref 70–99)
Potassium: 3.5 mmol/L (ref 3.5–5.1)
Sodium: 158 mmol/L — ABNORMAL HIGH (ref 135–145)

## 2021-06-09 LAB — URINALYSIS, COMPLETE (UACMP) WITH MICROSCOPIC
Bilirubin Urine: NEGATIVE
Glucose, UA: NEGATIVE mg/dL
Ketones, ur: NEGATIVE mg/dL
Leukocytes,Ua: NEGATIVE
Nitrite: NEGATIVE
Protein, ur: 100 mg/dL — AB
Specific Gravity, Urine: 1.027 (ref 1.005–1.030)
pH: 5 (ref 5.0–8.0)

## 2021-06-09 LAB — GLUCOSE, CAPILLARY
Glucose-Capillary: 157 mg/dL — ABNORMAL HIGH (ref 70–99)
Glucose-Capillary: 160 mg/dL — ABNORMAL HIGH (ref 70–99)
Glucose-Capillary: 162 mg/dL — ABNORMAL HIGH (ref 70–99)
Glucose-Capillary: 169 mg/dL — ABNORMAL HIGH (ref 70–99)
Glucose-Capillary: 173 mg/dL — ABNORMAL HIGH (ref 70–99)
Glucose-Capillary: 240 mg/dL — ABNORMAL HIGH (ref 70–99)

## 2021-06-09 LAB — SODIUM
Sodium: 159 mmol/L — ABNORMAL HIGH (ref 135–145)
Sodium: 157 mmol/L — ABNORMAL HIGH (ref 135–145)
Sodium: 158 mmol/L — ABNORMAL HIGH (ref 135–145)

## 2021-06-09 MED ORDER — PIPERACILLIN-TAZOBACTAM 3.375 G IVPB
3.3750 g | Freq: Three times a day (TID) | INTRAVENOUS | Status: AC
  Administered 2021-06-09 – 2021-06-16 (×21): 3.375 g via INTRAVENOUS
  Filled 2021-06-09 (×20): qty 50

## 2021-06-09 MED ORDER — FREE WATER
200.0000 mL | Status: DC
  Administered 2021-06-09 – 2021-06-18 (×47): 200 mL

## 2021-06-09 NOTE — Progress Notes (Signed)
STROKE TEAM PROGRESS NOTE  ? ?INTERVAL HISTORY ?Patient is lying in bed.  He remains aphasic but can be aroused and follows simple midline and left body commands.  Blood pressure adequately controlled.  He spiked temperature 101 today and white count is elevated 17.6.  Serum sodium is elevated 159 and potassium is low at 3.3.  Creatinine is creeping up at 1.44. ? ?Vitals:  ? 06/09/21 0400 06/09/21 0500 06/09/21 0600 06/09/21 0700  ?BP: (!) 141/70 138/72 137/62 (!) 150/67  ?Pulse: 94 91 81 86  ?Resp: (!) 27 (!) 26 (!) 23 (!) 24  ?Temp: (!) 101.1 ?F (38.4 ?C) 98.6 ?F (37 ?C)    ?TempSrc: Axillary Axillary    ?SpO2: 93% (!) 88% 94% (!) 89%  ?Weight:      ? ?CBC:  ?Recent Labs  ?Lab 06/04/21 ?0028 06/04/21 ?0034 06/08/21 ?[id number] 06/09/21 ?0409  ?WBC 17.6*   < > 14.8* 16.5*  ?NEUTROABS 14.3*  --   --   --   ?HGB 13.0   < > 13.8 13.9  ?HCT 37.0*   < > 41.2 41.3  ?MCV 90.5   < > 92.0 93.0  ?PLT 283   < > 318 318  ? < > = values in this interval not displayed.  ? ?Basic Metabolic Panel:  ?Recent Labs  ?Lab 06/08/21 ?[id number] 06/08/21 ?[id number] 06/08/21 ?1606 06/08/21 ?2220 06/09/21 ?0409  ?NA 159*   < >  --  159* 158*  ?K 3.3*  --   --   --  3.5  ?CL 129*  --   --   --  129*  ?CO2 20*  --   --   --  22  ?GLUCOSE 113*  --   --   --  215*  ?BUN 35*  --   --   --  40*  ?CREATININE 1.44*  --   --   --  1.51*  ?CALCIUM 9.1  --   --   --  8.9  ?MG 2.9*  --  2.9*  --   --   ?PHOS 2.2*  --  3.6  --   --   ? < > = values in this interval not displayed.  ? ?Lipid Panel:  ?Recent Labs  ?Lab 06/04/21 ?0028  ?CHOL 165  ?TRIG 46  ?HDL 38*  ?CHOLHDL 4.3  ?VLDL 9  ?LDLCALC 118*  ? ?HgbA1c:  ?Recent Labs  ?Lab 06/04/21 ?0028  ?HGBA1C 5.6  ? ?Urine Drug Screen:  ?Recent Labs  ?Lab 06/05/21 ?0224  ?LABOPIA NONE DETECTED  ?COCAINSCRNUR NONE DETECTED  ?LABBENZ NONE DETECTED  ?AMPHETMU NONE DETECTED  ?THCU NONE DETECTED  ?LABBARB NONE DETECTED  ? ?Alcohol Level No results for input(s): ETH in the last 168 hours. ? ?IMAGING past 24 hours ?DG Abd Portable  1V ? ?Result Date: 06/08/2021 ?CLINICAL DATA:  Status post nasogastric tube placement. EXAM: PORTABLE ABDOMEN - 1 VIEW COMPARISON:  None. FINDINGS: Nasogastric tube with tip and side port overlying the stomach, there is an acute angulation of the tube at the side port which may affect tube functionality. The bowel gas pattern is normal. No radio-opaque calculi or other significant radiographic abnormality are seen. IMPRESSION: Nasogastric tube with tip and side port overlying the stomach, there is an acute angulation of the tube at the side port which may affect tube functionality. Electronically Signed   By: Dahlia Bailiff M.D.   On: 06/08/2021 10:14   ? ?PHYSICAL EXAM ?Constitutional: Elderly white male who is not in  distress but mildly agitated this morning ?Eyes: No scleral injection ?Respiratory: Effort normal, non-labored breathing ?Gait and Station: Deferred ? ?Neuro: ?PERRL, drowsy but can be easily aroused.  Will intermittently follow only a few midline and left body commands, expressive aphasia.  Will move LUE and BLE spontaneously, no movement of RUE.  Tongue midline ? ?ASSESSMENT/PLAN ?[name] is a 71 y.o. male with history of not going to PCP in decades presented (3/31) for right sided hemiplegia and numbness after 1 day of hand tingling. Admitted for left high frontal ICH. 12hrs MRI showed ICH stable without hydrocephalus or herniation. CT head on 4/1 shows stable ICH. Patient in SVT vs afib w/ RVR, cardiology consulted on 4/3. NG placed 4/4, Cardizem transitioned to PO.  ?  ? ?Stroke: Left high frontal parenchymal ICH presumably from uncontrolled hypertension with mild cytotoxic edema and trace intraventricular extension but no hydrocephalus ?code Stroke CT head:  Acute intraparenchymal hemorrhage centered at the high posterior left frontal lobe measures 4.0 x 3.2 x 4.5 cm. Surrounding low-density vasogenic edema. Associated small volume subarachnoid hemorrhage within the adjacent left frontal  region. Intraventricular extension with blood seen within the left greater than right lateral ventricles. ICH volume ~ 29 mL ?CT head at 3hrs and 8hrs after: Stable ICH ?MRI: Stable ICH, no hydrocephalus, no herniations ?MRA: No large vessel stenosis or occlusion.  No aneurysms.   ?2D Echo: Left ventricular ejection fraction 60-65%.   ?LDL 118  (06/04/2021) ?HgbA1c 5.6 (06/04/2021)  ?VTE prophylaxis - Enoxaparin (lovenox) injection 40 mg start: 06/08/21 1200 ?Scd's start: 06/04/21 0052, Sequential compression devices, below knee,  ?Diet: Dysph 3 ?No antithrombotic prior to admission, no antithrombotic now secondary to [place] ?Therapy recommendations: CIR ?Disposition: Acute inpatient rehab (3hours/day) Pending ? ?New Onset Atrial fibrillation ?EKG shows afib w/ RVR ?Cardizem gtt with one time IVP ordered for rate control ?Cardiology consulted 06/07/21  ?Rate currently controlled ?Infusion transitioned to PO Cardizem on 4/4. PRN medication for rate control ordered.  ?120mg  q6hrs ? ?Hypertension ?Home meds: N/A  ?Unstable, still requiring cleviprex ?Long-term BP goal normotensive ?PRN labetalol, hydralazine  ?SBP goal <160  ? ?Hyperlipidemia ?Home meds:  N/A ?LDL 118  (06/04/2021), goal < 70  ?Will add statin at discharge ? ?Other Stroke Risk Factors ?Advanced Age >/= 65  ?Family hx stroke - family history is not on file.  ?Has not seen PCP in decades ?UDS - negative ? ?Other Active Problems ?SVT vs afib?  ?Patient had an episode of SVT with rate to the 180s on 4/1.  This resolved with labetalol administration ?Agitation ?Clonidine, seroquel, haldol  ?Precedex resulted in bradycardia with HR 30 ?Delirium precautions  ?Leukocytosis - WBC's -  17.7->14.8->16.5 (temp - 101.1->98.6) Currently not on abxs. Check portable CXR and UA - pending ?NG placed 4/4 ?Hypokalemia - potassium - 3.3->3.5 corrected ?Hypermagnesemia - Magnesium - 2.9 ?Hypernatremia - Na - 158 (Na Q 6 hrs) ?Hypophosphatemia - Phosphorus - 2.2->3.6 corrected ?AKI  - creatinine - 1.44->1.51 - consider IV fluid hydration ? ? ? ?Hospital day # 5 ? ? ? Patient neurological exam remains unchanged with aphasia and dense right hemiplegia but is able to follow few simple midline commands and commands on the left.  He is running fever and has elevated white count and lab work suggest some prerenal azotemia.  We will get medical hospitalist team to consult and take over as needs ICU.  Start free water and check chest x-ray for pneumonia and UA for infection.  Continue strict  blood pressure control with systolic goal below 0000000.  Start blood pressure medications through NG tube as well as Cardizem    Appreciate cardiology help.  Patient is not a candidate for anticoagulation at the present time given acute intracerebral hemorrhage.  Mobilize out of bed.  Therapy consults.  No family available at the bedside.This patient is critically ill and at significant risk of neurological worsening, death and care requires constant monitoring of vital signs, hemodynamics,respiratory and cardiac monitoring, extensive review of multiple databases, frequent neurological assessment, discussion with family, other specialists and medical decision making of high complexity.I have made any additions or clarifications directly to the above note.This critical care time does not reflect procedure time, or teaching time or supervisory time of PA/NP/Med Resident etc but could involve care discussion time. ? I spent 30 minutes of neurocritical care time  in the care of  this patient. ? ?Antony Contras MD ? ? ? ? ?

## 2021-06-09 NOTE — Progress Notes (Signed)
Physical Therapy Treatment ?Patient Details ?Name: Louis Moore ?MRN: [id number] ?DOB: 29-Apr-1950 ?Today's Date: 06/09/2021 ? ? 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 Stated on hypertonic saline, cleviprex.  No PMH as has not seen PCP in decades. ? ?  ?PT Comments  ? ? Patient progressing with mobility and able to mobilize OOB to chair still needing +2 A for safety with R lateral lean and R side weakness.  He seemed more alert and focused on TV when up in the chair.  SpO2 drop on 2,5L O2 to 85% so up to 4L and slow return to 90%.  RN made aware.  Patient remains appropriate for follow up acute inpatient rehab at d/c.   ?Recommendations for follow up therapy are one component of a multi-disciplinary discharge planning process, led by the attending physician.  Recommendations may be updated based on patient status, additional functional criteria and insurance authorization. ? ?Follow Up Recommendations ? Acute inpatient rehab (3hours/day) ?  ?  ?Assistance Recommended at Discharge Frequent or constant Supervision/Assistance  ?Patient can return home with the following Two people to help with walking and/or transfers;Two people to help with bathing/dressing/bathroom;Assistance with cooking/housework;Direct supervision/assist for medications management;Assist for transportation;Help with stairs or ramp for entrance;Direct supervision/assist for financial management;Assistance with feeding ?  ?Equipment Recommendations ? Other (comment) (TBA)  ?  ?Recommendations for Other Services   ? ? ?  ?Precautions / Restrictions Precautions ?Precautions: Fall ?Precaution Comments: R neglect, hemiparesis, NG tube  ?  ? ?Mobility ? Bed Mobility ?Overal bed mobility: Needs Assistance ?Bed Mobility: Rolling, Sidelying to Sit ?Rolling: Mod assist, +2 for safety/equipment ?Sidelying to  sit: Mod assist, +2 for safety/equipment ?  ?Sit to supine: +2 for physical assistance, Mod assist ?  ?General bed mobility comments: assist for flexing L LE and for shoulder protraction to roll, assist for legs off bed and pt attempting to lift trunk ?  ? ?Transfers ?Overall transfer level: Needs assistance ?  ?  ?Sit to Stand: Max assist, +2 physical assistance ?  ?  ?  ?  ?  ?General transfer comment: chair on L side and pt with R knee blocked able to stand and pivot with +2.  Stood again to reposition hips in chair ?  ? ?Ambulation/Gait ?  ?  ?  ?  ?  ?  ?  ?  ? ? ?Stairs ?  ?  ?  ?  ?  ? ? ?Wheelchair Mobility ?  ? ?Modified Rankin (Stroke Patients Only) ?Modified Rankin (Stroke Patients Only) ?Pre-Morbid Rankin Score: No symptoms ?Modified Rankin: Severe disability ? ? ?  ?Balance Overall balance assessment: Needs assistance ?Sitting-balance support: Feet supported ?  ?Sitting balance - Comments: mod to max A for balance due to pushing R ?Postural control: Right lateral lean ?Standing balance support: Bilateral upper extremity supported ?Standing balance-Leahy Scale: Zero ?Standing balance comment: max A or 2 for balance with R hip and knee support and cues for upright posture ?  ?  ?  ?  ?  ?  ?  ?  ?  ?  ?  ?  ? ?  ?Cognition   ?Behavior During Therapy: Restless ?Overall Cognitive Status: Difficult to assess ?Area of Impairment: Attention, Following commands, Safety/judgement ?  ?  ?  ?  ?  ?  ?  ?  ?  ?  Current Attention Level: Sustained ?  ?Following Commands: Follows one step commands inconsistently ?Safety/Judgement: Decreased awareness of deficits, Decreased awareness of safety ?  ?  ?  ?  ?  ? ?  ?Exercises   ? ?  ?General Comments General comments (skin integrity, edema, etc.): on 2.5 L O2 initially, dropped to 87% with transfer, noted even after in chair dropping to 85%, increased O2 to 4LPM and slowly back to 90%, RN aware ?  ?  ? ?Pertinent Vitals/Pain Pain Assessment ?Pain Assessment: Faces ?Faces  Pain Scale: No hurt  ? ? ?Home Living   ?  ?  ?  ?  ?  ?  ?  ?  ?  ?   ?  ?Prior Function    ?  ?  ?   ? ?PT Goals (current goals can now be found in the care plan section) Progress towards PT goals: Progressing toward goals ? ?  ?Frequency ? ? ? Min 4X/week ? ? ? ?  ?PT Plan Current plan remains appropriate  ? ? ?Co-evaluation   ?  ?  ?  ?  ? ?  ?AM-PAC PT "6 Clicks" Mobility   ?Outcome Measure ? Help needed turning from your back to your side while in a flat bed without using bedrails?: A Lot ?Help needed moving from lying on your back to sitting on the side of a flat bed without using bedrails?: A Lot ?Help needed moving to and from a bed to a chair (including a wheelchair)?: Total ?Help needed standing up from a chair using your arms (e.g., wheelchair or bedside chair)?: Total ?Help needed to walk in hospital room?: Total ?Help needed climbing 3-5 steps with a railing? : Total ?6 Click Score: 8 ? ?  ?End of Session Equipment Utilized During Treatment: Gait belt ?  ?Patient left: with chair alarm set;with restraints reapplied ?Nurse Communication: Mobility status ?PT Visit Diagnosis: Other abnormalities of gait and mobility (R26.89);Hemiplegia and hemiparesis;Other symptoms and signs involving the nervous system (R29.898) ?Hemiplegia - dominant/non-dominant: Dominant ?Hemiplegia - caused by: Nontraumatic intracerebral hemorrhage ?  ? ? ?Time: [id number] ?PT Time Calculation (min) (ACUTE ONLY): 28 min ? ?Charges:  $Therapeutic Activity: 23-37 mins          ?          ? ?Magda Kiel, PT ?Acute Rehabilitation Services ?[id number] ?Office:[phone] ?06/09/2021 ? ? ? ?[name] ?06/09/2021, 4:08 PM ? ?

## 2021-06-09 NOTE — Progress Notes (Signed)
HOSPITAL MEDICINE OVERNIGHT EVENT NOTE   ? ?Notified by nursing that patient continues to exhibit agitation with ongoing attempts to pull at medical hardware and attempting to get out of bed.  Patient is not following commands consistently. ?  ?Unfortunately there continues to be no sitter available.  Due to ongoing to the patient current restraint orders will be renewed. ? ?Toya Palacios J Aleen Marston  MD ?[facility]  ? ? ? ? ? ? ? ? ? ? ?

## 2021-06-09 NOTE — Progress Notes (Signed)
Pharmacy Antibiotic Note ? ?Louis Moore is a 71 y.o. male admitted on 06/04/2021 with aspiration pneumonia.  Pharmacy has been consulted for Zosyn dosing. ? ?Plan: ?Zosyn 3.375g IV q8h (4 hour infusion). ? ?Weight: 65.6 kg (144 lb 10 oz) ? ?Temp (24hrs), Avg:99.7 ?F (37.6 ?C), Min:97.7 ?F (36.5 ?C), Max:101.4 ?F (38.6 ?C) ? ?Recent Labs  ?Lab 06/05/21 ?1048 06/06/21 ?0428 06/07/21 ?0431 06/08/21 ?[id number] 06/09/21 ?0409  ?WBC 18.0* 11.5* 16.3* 14.8* 16.5*  ?CREATININE 1.02 1.15 1.05 1.44* 1.51*  ?  ?CrCl cannot be calculated (Unknown ideal weight.).   ? ?No Known Allergies ? ?Antimicrobials this admission: ?Zosyn 4/5 >>  ? ?Dose adjustments this admission: ?N/A ? ?Microbiology results: ?4/1 MRSA PCR: not detected ? ?Thank you for allowing pharmacy to be a part of this patient?s care. ? ?Kaleen Mask ?06/09/2021 1:25 PM ? ?

## 2021-06-09 NOTE — Progress Notes (Signed)
SLP Cancellation Note ? ?Patient Details ?Name: Louis Moore ?MRN: [id number] ?DOB: 10-05-1950 ? ? ?Cancelled treatment:       Reason Eval/Treat Not Completed: Fatigue/lethargy limiting ability to participate  Attempted to see pt for swallowing reassessment and aphasia therapy.  Spoke with RN.  Pt remains lethargic and would be unable to take any POs more than trials today.  SLP will follow for improved alertness and readiness for POs. ? ? ?Kylie Simmonds E Khari Mally , MA, CCC-SLP ?Acute Rehabilitation Services ?Office: [phone] ?06/09/2021, 10:55 AM ?

## 2021-06-10 DIAGNOSIS — I610 Nontraumatic intracerebral hemorrhage in hemisphere, subcortical: Secondary | ICD-10-CM | POA: Diagnosis not present

## 2021-06-10 DIAGNOSIS — E870 Hyperosmolality and hypernatremia: Secondary | ICD-10-CM

## 2021-06-10 DIAGNOSIS — J690 Pneumonitis due to inhalation of food and vomit: Secondary | ICD-10-CM | POA: Diagnosis not present

## 2021-06-10 DIAGNOSIS — E43 Unspecified severe protein-calorie malnutrition: Secondary | ICD-10-CM

## 2021-06-10 DIAGNOSIS — I480 Paroxysmal atrial fibrillation: Secondary | ICD-10-CM | POA: Diagnosis not present

## 2021-06-10 LAB — GLUCOSE, CAPILLARY
Glucose-Capillary: 140 mg/dL — ABNORMAL HIGH (ref 70–99)
Glucose-Capillary: 140 mg/dL — ABNORMAL HIGH (ref 70–99)
Glucose-Capillary: 159 mg/dL — ABNORMAL HIGH (ref 70–99)
Glucose-Capillary: 162 mg/dL — ABNORMAL HIGH (ref 70–99)
Glucose-Capillary: 171 mg/dL — ABNORMAL HIGH (ref 70–99)
Glucose-Capillary: 178 mg/dL — ABNORMAL HIGH (ref 70–99)
Glucose-Capillary: 199 mg/dL — ABNORMAL HIGH (ref 70–99)

## 2021-06-10 LAB — SODIUM
Sodium: 158 mmol/L — ABNORMAL HIGH (ref 135–145)
Sodium: 156 mmol/L — ABNORMAL HIGH (ref 135–145)

## 2021-06-10 LAB — BASIC METABOLIC PANEL
Anion gap: 4 — ABNORMAL LOW (ref 5–15)
BUN: 36 mg/dL — ABNORMAL HIGH (ref 8–23)
CO2: 24 mmol/L (ref 22–32)
Calcium: 8.4 mg/dL — ABNORMAL LOW (ref 8.9–10.3)
Chloride: 123 mmol/L — ABNORMAL HIGH (ref 98–111)
Creatinine, Ser: 1.27 mg/dL — ABNORMAL HIGH (ref 0.61–1.24)
GFR, Estimated: >60 mL/min (ref >60)
Glucose, Bld: 170 mg/dL — ABNORMAL HIGH (ref 70–99)
Potassium: 3.5 mmol/L (ref 3.5–5.1)
Sodium: 151 mmol/L — ABNORMAL HIGH (ref 135–145)

## 2021-06-10 MED ORDER — DEXTROSE 5 % IV SOLN: INTRAVENOUS | Status: DC

## 2021-06-10 NOTE — Progress Notes (Signed)
Speech Language Pathology Treatment: Dysphagia  ?Patient Details ?Name: Louis Moore ?MRN: [id number] ?DOB: February 08, 1951 ?Today's Date: 06/10/2021 ?Time: [id number] ?SLP Time Calculation (min) (ACUTE ONLY): 16 min ? ?Assessment / Plan / Recommendation ?Clinical Impression ? Pt was seen for dysphagia treatment. He was lethargic throughout the session, and pt's RN reported that this is not significantly improved compared to yesterday. Secretions were pooled in the oral cavity and suctioned by SLP. Oral care was provided and some lingual movement/resistance was noted during this. Bolus manipulation of puree and ice chips was absent and these boluses were ultimately removed from the oral cavity. Pt demonstrated a decline in swallow function on 4/3 and his trays have been held since 4/4 due to lethargy. An NPO status is recommended at this time and his diet order will be modified to reflect this. Case discussed with Dr. Louanne Moore who is in agreement. Large bore NGT present; discussed with RD who may re-attempt to place [place] 4/7. SLP will continue to follow pt.   ?  ?HPI HPI: 71 y.o. male with no significant PMH but has not seen a PCP in decades admitted with aphasia and right sided weakness. Dx acute Intraparenchymal hemorrhage in the high L posterior frontal lobe with associated cerebral edema, mass effect, midline shift and brain compression. Cortrak placement unsuccessful 4/3. ?  ?   ?SLP Plan ? Continue with current plan of care ? ?  ?  ?Recommendations for follow up therapy are one component of a multi-disciplinary discharge planning process, led by the attending physician.  Recommendations may be updated based on patient status, additional functional criteria and insurance authorization. ?  ? ?Recommendations  ?Diet recommendations: NPO ?Medication Administration: Via alternative means  ?   ?    ?   ? ? ? ? Oral Care Recommendations: Oral care BID ?Follow Up Recommendations: Acute inpatient rehab  (3hours/day) ?Assistance recommended at discharge: Frequent or constant Supervision/Assistance ?SLP Visit Diagnosis: Dysphagia, oropharyngeal phase (R13.12);Aphasia (R47.01) ?Plan: Continue with current plan of care ? ? ? ? ?  ?  ?Louis Moore, [place], CCC-SLP ?Acute Rehabilitation Services ?Office number [phone] ?Pager [phone] ? ? ?Louis Moore ? ?06/10/2021, 12:12 PM ? ? ? ?

## 2021-06-10 NOTE — Progress Notes (Addendum)
?PROGRESS NOTE ? ? ? ?Louis Moore  [id number] DOB: October 17, 1950 DOA: 06/04/2021 ?PCP: Pcp, No  ? ? ?Brief Narrative:  ?Patient is a 71 years old male with no significant medical history but no follow-up with PCP presented to hospital with numbness in his fingers on the right and had a slipper from his bed but did not fall.  His brother called EMS and patient was brought to the hospital for code stroke.  Patient had mild aphasia right upper extremity plegia and right lower extremity weakness and visual neglect.  CT head scan showed acute intraparenchymal hemorrhage around the posterior left frontal lobe with some subarachnoid hemorrhage and intraventricular extension.  There was some mild 3 mm midline shift.  Initial blood pressure was elevated in the 190s.  Patient was then initially admitted by the neurology team in the hospital in the ICU setting.  Patient was subsequently considered stable for transfer out of the ICU. ? ?Assessment and plan. ?Principal Problem: ?  ICH (intracerebral hemorrhage) ([place]) ?Active Problems: ?  Protein-calorie malnutrition, severe ?  Paroxysmal atrial fibrillation ([facility]) ?  Hypernatremia ?  Aspiration pneumonia ([place]) ?  ?Left high frontal parenchymal intracranial hemorrhage likely from uncontrolled hypertension with intraventricular extension and subarachnoid hemorrhage. ? ?Neurology on board.  Patient was not on any anticoagulation prior to this admission.  CT scan repeated showed a stable ICH with no hydrosyphilis.  At this time patient has been transferred out of the ICU.  2D echocardiogram with LV ejection fraction of 60 to 65%.  LDL at 118 hemoglobin A1c of 5.6.  Patient has been started on a Lovenox for DVT prophylaxis.  Physical therapy has seen the patient and recommended CIR at this time. ? ?Fever with mild leukocytosis.  Chest x-ray with bibasilar airspace opacities and mild pleural effusion.  Temperature max of 100.9 ?F.  Patient has been started on IV Zosyn per concerns of  possible aspiration pneumonia. ? ?Hypernatremia.  Significant.  On NG tube feeding with q4 H water flushes.  We will add D5 water today due to significant hyponatremia. ? ?New-onset atrial fibrillation.  Cardiology on board.  Has been transitioned to oral Cardizem at this time. ? ?Essential hypertension.  Elevated blood pressure on presentation.  Not on medications at home.  On as needed labetalol and hydralazine.  On oral Cardizem as well.  Systolic blood pressure goal less than 160. ? ?Hyperlipidemia.  LDL of 118.  We will need to start statins on discharge. ? ?Acute kidney injury.  Creatinine from 06/09/2021 at 1.5.  Add D5 water for hypernatremia.  Continue water flushes.  Check BMP today. ? ?Deconditioning, debility ?We will need rehabilitation ? ?Agitation, delirium. ?On restraints and Haldol.  Currently Calm. ? ?Nutrition.  On tube feeding.  Tolerating.  Continue.  Will consider for cortrak tube tube placement in AM. ?  ? ?  DVT prophylaxis: enoxaparin (LOVENOX) injection 40 mg Start: 06/08/21 1200 ?SCD's Start: 06/04/21 0052 ? ? ?Code Status:   ?  Code Status: Full Code ? ?Disposition: CIR as per PT recommendation. ? ?Status is: Inpatient ? ?Remains inpatient appropriate because: Intracranial hemorrhage, hypernatremia, need for rehabilitation, ? ? Family Communication: ? None at beside.  I tried to call the patient's brother who is the contact but was unable to reach him.  Unable to go through his phone. ? ?Consultants:  ?Neurology ? ?Procedures:  ?NG tube placement, ? ?Antimicrobials:  ?Zosyn ? ?Anti-infectives (From admission, onward)  ? ? Start     Dose/Rate Route  Frequency Ordered Stop  ? 06/09/21 1400  piperacillin-tazobactam (ZOSYN) IVPB 3.375 g       ? 3.375 g ?12.5 mL/hr over 240 Minutes Intravenous Every 8 hours 06/09/21 1324    ? ?  ? ? ?Subjective: ?Today, patient was seen and examined at bedside.  Patient is nonverbal.  Appears to be having some breathing difficulty. ? ?Objective: ?Vitals:  ?  06/10/21 0654 06/10/21 0723 06/10/21 1119 06/10/21 1619  ?BP:  (!) 152/64 (!) 144/64 (!) 145/61  ?Pulse:  84 78 74  ?Resp:  16 16 18   ?Temp: (!) 100.4 ?F (38 ?C) 100 ?F (37.8 ?C) 99.8 ?F (37.7 ?C) 99.4 ?F (37.4 ?C)  ?TempSrc: Oral Oral Oral Oral  ?SpO2:  93% 94% 94%  ?Weight:      ? ? ?Intake/Output Summary (Last 24 hours) at 06/10/2021 1632 ?Last data filed at 06/10/2021 0228 ?Gross per 24 hour  ?Intake --  ?Output 450 ml  ?Net -450 ml  ? ?Filed Weights  ? 06/04/21 0000 06/10/21 0553  ?Weight: 65.6 kg 63.4 kg  ? ? ?Physical Examination: ? ?General:  Average built, mildly tachypneic, nonverbal, opens eyes spontaneously ?HENT:   No scleral pallor or icterus noted. Oral mucosa is moist.  NG tube in place ?Chest:   Diminished breath sounds bilaterally.  Mildly tachypneic, coarse breath sounds noted. ?CVS: S1 &S2 heard.  Irregular rhythm. ?Abdomen: Soft, nontender, nondistended.  Bowel sounds are heard.  Abdominal restraints in place. ?Extremities: No cyanosis, clubbing or edema.  Peripheral pulses are palpable.  Mittens in the left upper extremity ?Psych: Alert, awake but nonverbal, not following commands ?CNS: Alert awake but nonverbal.  Not following commands.  Right sided flaccid weakness.  Increased tone on the left. ?Skin: Warm and dry.  ? ?Data Reviewed:  ? ?CBC: ?Recent Labs  ?Lab 06/04/21 ?0028 06/04/21 ?0034 06/05/21 ?1048 06/06/21 ?0428 06/07/21 ?0431 06/08/21 ?[id number] 06/09/21 ?0409  ?WBC 17.6*  --  18.0* 11.5* 16.3* 14.8* 16.5*  ?NEUTROABS 14.3*  --   --   --   --   --   --   ?HGB 13.0   < > 13.0 11.1* 13.8 13.8 13.9  ?HCT 37.0*   < > 37.7* 33.3* 40.6 41.2 41.3  ?MCV 90.5  --  90.2 92.0 91.4 92.0 93.0  ?PLT 283  --  322 264 326 318 318  ? < > = values in this interval not displayed.  ? ? ?Basic Metabolic Panel: ?Recent Labs  ?Lab 06/06/21 ?0428 06/06/21 ?[id number] 06/07/21 ?0431 06/07/21 ?1001 06/07/21 ?1540 06/07/21 ?2121 06/08/21 ?[id number] 06/08/21 ?[id number] 06/08/21 ?1606 06/08/21 ?2220 06/09/21 ?0409 06/09/21 ?1001  06/09/21 ?1539 06/09/21 ?2147 06/10/21 ?0415 06/10/21 ?0932 06/10/21 ?1437  ?NA 156*   < > 155* 156* 157*   < > 159*   < >  --    < > 158*   < > 157* 158* 158* 156* 151*  ?K 3.8  --  3.5  --   --   --  3.3*  --   --   --  3.5  --   --   --   --   --  3.5  ?CL 125*  --  124*  --   --   --  129*  --   --   --  129*  --   --   --   --   --  123*  ?CO2 20*  --  19*  --   --   --  20*  --   --   --  22  --   --   --   --   --  24  ?GLUCOSE 113*  --  98  --   --   --  113*  --   --   --  215*  --   --   --   --   --  170*  ?BUN 20  --  22  --   --   --  35*  --   --   --  40*  --   --   --   --   --  36*  ?CREATININE 1.15  --  1.05  --   --   --  1.44*  --   --   --  1.51*  --   --   --   --   --  1.27*  ?CALCIUM 8.5*  --  9.2  --   --   --  9.1  --   --   --  8.9  --   --   --   --   --  8.4*  ?MG  --   --   --  2.7* 2.7*  --  2.9*  --  2.9*  --   --   --   --   --   --   --   --   ?PHOS  --   --   --  2.1* 2.2*  --  2.2*  --  3.6  --   --   --   --   --   --   --   --   ? < > = values in this interval not displayed.  ? ? ?Liver Function Tests: ?Recent Labs  ?Lab 06/04/21 ?0028  ?AST 19  ?ALT 13  ?ALKPHOS 56  ?BILITOT 0.5  ?PROT 6.4*  ?ALBUMIN 3.6  ? ? ? ?Radiology Studies: ?DG Chest Port 1 View ? ?Result Date: 06/09/2021 ?CLINICAL DATA:  Fever, leukocytosis EXAM: PORTABLE CHEST 1 VIEW COMPARISON:  None. FINDINGS: The cardiomediastinal silhouette is within normal limits. Nasogastric tube tip and side port overlie the stomach. There are right basilar airspace opacities and a small right pleural effusion. Left lung is clear. No pneumothorax. No acute osseous abnormality. IMPRESSION: Right basilar airspace opacities and small right pleural effusion, could be atelectasis or pneumonia. Electronically Signed   By: Maurine Simmering M.D.   On: 06/09/2021 08:48   ? ? ? LOS: 6 days  ? ? ?Flora Lipps, MD ?[facility] ?06/10/2021, 4:32 PM  ? ? ?

## 2021-06-10 NOTE — Progress Notes (Signed)
STROKE TEAM PROGRESS NOTE  ? ?INTERVAL HISTORY ?Patient is lying in bed.  He remains aphasic but can be aroused and follows simple midline and left body commands.  Blood pressure adequately controlled.  He  continues to have temperature 100.4 today and white count is elevated 17.6.  Serum sodium is elevated 158 despite free water.  We will start D5W today ? ?Vitals:  ? 06/10/21 0553 06/10/21 0654 06/10/21 0723 06/10/21 1119  ?BP:   (!) 152/64 (!) 144/64  ?Pulse:   84 78  ?Resp:   16 16  ?Temp:  (!) 100.4 ?F (38 ?C) 100 ?F (37.8 ?C) 99.8 ?F (37.7 ?C)  ?TempSrc:  Oral Oral Oral  ?SpO2:   93% 94%  ?Weight: 63.4 kg     ? ?CBC:  ?Recent Labs  ?Lab 06/04/21 ?0028 06/04/21 ?0034 06/08/21 ?[id number] 06/09/21 ?0409  ?WBC 17.6*   < > 14.8* 16.5*  ?NEUTROABS 14.3*  --   --   --   ?HGB 13.0   < > 13.8 13.9  ?HCT 37.0*   < > 41.2 41.3  ?MCV 90.5   < > 92.0 93.0  ?PLT 283   < > 318 318  ? < > = values in this interval not displayed.  ? ?Basic Metabolic Panel:  ?Recent Labs  ?Lab 06/08/21 ?[id number] 06/08/21 ?[id number] 06/08/21 ?1606 06/08/21 ?2220 06/09/21 ?0409 06/09/21 ?1001 06/10/21 ?0932 06/10/21 ?1437  ?NA 159*   < >  --    < > 158*   < > 156* 151*  ?K 3.3*  --   --   --  3.5  --   --  3.5  ?CL 129*  --   --   --  129*  --   --  123*  ?CO2 20*  --   --   --  22  --   --  24  ?GLUCOSE 113*  --   --   --  215*  --   --  170*  ?BUN 35*  --   --   --  40*  --   --  36*  ?CREATININE 1.44*  --   --   --  1.51*  --   --  1.27*  ?CALCIUM 9.1  --   --   --  8.9  --   --  8.4*  ?MG 2.9*  --  2.9*  --   --   --   --   --   ?PHOS 2.2*  --  3.6  --   --   --   --   --   ? < > = values in this interval not displayed.  ? ?Lipid Panel:  ?Recent Labs  ?Lab 06/04/21 ?0028  ?CHOL 165  ?TRIG 46  ?HDL 38*  ?CHOLHDL 4.3  ?VLDL 9  ?LDLCALC 118*  ? ?HgbA1c:  ?Recent Labs  ?Lab 06/04/21 ?0028  ?HGBA1C 5.6  ? ?Urine Drug Screen:  ?Recent Labs  ?Lab 06/05/21 ?0224  ?LABOPIA NONE DETECTED  ?COCAINSCRNUR NONE DETECTED  ?LABBENZ NONE DETECTED  ?AMPHETMU NONE DETECTED   ?THCU NONE DETECTED  ?LABBARB NONE DETECTED  ? ?Alcohol Level No results for input(s): ETH in the last 168 hours. ? ?IMAGING past 24 hours ?No results found. ? ?PHYSICAL EXAM ?Constitutional: Elderly white male who is not in distress but mildly agitated this morning ?Eyes: No scleral injection ?Respiratory: Effort normal, non-labored breathing ?Gait and Station: Deferred ? ?Neuro: ?PERRL, drowsy but can be easily aroused.  Will intermittently  follow only a few midline and left body commands, expressive aphasia.  Will move LUE and BLE spontaneously, no movement of RUE.  Tongue midline ? ?ASSESSMENT/PLAN ?[name] is a 71 y.o. male with history of not going to PCP in decades presented (3/31) for right sided hemiplegia and numbness after 1 day of hand tingling. Admitted for left high frontal ICH. 12hrs MRI showed ICH stable without hydrocephalus or herniation. CT head on 4/1 shows stable ICH. Patient in SVT vs afib w/ RVR, cardiology consulted on 4/3. NG placed 4/4, Cardizem transitioned to PO.  ?  ? ?Stroke: Left high frontal parenchymal ICH presumably from uncontrolled hypertension with mild cytotoxic edema and trace intraventricular extension but no hydrocephalus ?code Stroke CT head:  Acute intraparenchymal hemorrhage centered at the high posterior left frontal lobe measures 4.0 x 3.2 x 4.5 cm. Surrounding low-density vasogenic edema. Associated small volume subarachnoid hemorrhage within the adjacent left frontal region. Intraventricular extension with blood seen within the left greater than right lateral ventricles. ICH volume ~ 29 mL ?CT head at 3hrs and 8hrs after: Stable ICH ?MRI: Stable ICH, no hydrocephalus, no herniations ?MRA: No large vessel stenosis or occlusion.  No aneurysms.   ?2D Echo: Left ventricular ejection fraction 60-65%.   ?Portable CXR 1 view - 06/09/21 - Right basilar airspace opacities and small right pleural effusion, could be atelectasis or pneumonia. ?U/A - 06/09/21 - not C/W  UTI ?LDL 118  (06/04/2021) ?HgbA1c 5.6 (06/04/2021)  ?VTE prophylaxis - Enoxaparin (lovenox) injection 40 mg start: 06/08/21 1200 ?Scd's start: 06/04/21 0052, Sequential compression devices, below knee, Bilateral lower extremities,  ?Diet: Dysph 3 ?No antithrombotic prior to admission, no antithrombotic now secondary to [place] ?Therapy recommendations: CIR ?Disposition: Acute inpatient rehab (3hours/day) Pending ? ?New Onset Atrial fibrillation ?EKG shows afib w/ RVR ?Cardizem gtt with one time IVP ordered for rate control ?Cardiology consulted 06/07/21  ?Rate currently controlled ?Infusion transitioned to PO Cardizem on 4/4. PRN medication for rate control ordered.  ?120mg  q6hrs ? ?Hypertension ?Home meds: N/A  ?Unstable, still requiring cleviprex ?Long-term BP goal normotensive ?PRN labetalol, hydralazine  ?SBP goal <160  ? ?Hyperlipidemia ?Home meds:  N/A ?LDL 118  (06/04/2021), goal < 70  ?Will add statin at discharge ? ?Other Stroke Risk Factors ?Advanced Age >/= 14  ?Family hx stroke - family history is not on file.  ?Has not seen PCP in decades ?UDS - negative ? ?Other Active Problems ?SVT vs afib?  ?Patient had an episode of SVT with rate to the 180s on 4/1.  This resolved with labetalol administration ?Agitation ?Clonidine, seroquel, haldol  ?Precedex resulted in bradycardia with HR 30 ?Delirium precautions  ?Leukocytosis - WBC's -  17.7->14.8->16.5 (temp - 101.1->98.6->100.4->100->99.8) Currently not on abxs.  ?Portable CXR and UA - as above - Zosyn started 06/09/21 ?NG placed 4/4 ?Hypokalemia - potassium - 3.3->3.5 corrected ?Hypermagnesemia - Magnesium - 2.9 ?Hypernatremia - Na - 158 (Na Q 6 hrs) ?Hypophosphatemia - Phosphorus - 2.2->3.6 corrected ?AKI - creatinine - 1.44->1.5->1.27 - Now on Dextrose 5% at 75 cc / hr plus free water 200 ml Q 4 hrs ? ?Appreciate Hospitalist's assistance ? ?Hospital day # 6 ? ? ? Patient neurological exam remains unchanged with aphasia and dense right hemiplegia but is able to  follow few simple midline commands and commands on the left.  He is running fever and has elevated white count and lab work suggest some prerenal azotemia.  Appreciate  medical hospitalist team to  take over his care  continue free water and and start D5W.  Continue antibiotics for pneumonia    Continue strict blood pressure control with systolic goal below 0000000.    Patient is not a candidate for anticoagulation at the present time given acute intracerebral hemorrhage.  Mobilize out of bed.  Therapy consults.  No family available at the bedside.discussed with Dr. Corrie Mckusick.  Greater than 50% time during this 35-minute visit was spent in counseling and coordination of care and discussion patient and care team about his suspected pneumonia and atrial fibrillation and answering questions. ?Antony Contras, MD ? ? ? ? ? ? ?

## 2021-06-10 NOTE — Progress Notes (Signed)
Inpatient Rehab Admissions Coordinator:  ? ?Met with patient again at bedside.  He is not significantly changed from when I saw him day before last, although more lethargic today.  Does not wake to voice or deep pressure to insole.  Do not feel he can participate well enough for CIR at this time so would recommend f/u at alternative level of care such as SNF.  Discussed with TOC team.  ? ?Shann Medal, PT, DPT ?Admissions Coordinator ?[phone] ?06/10/21  ?12:57 PM ? ?

## 2021-06-10 NOTE — Care Management Important Message (Signed)
Important Message ? ?Patient Details  ?Name: Louis Moore ?MRN: [id number] ?Date of Birth: 11-22-1950 ? ? ?Medicare Important Message Given:  Yes ? ? ? ? ?Myrle Wanek ?06/10/2021, 1:29 PM ?

## 2021-06-10 NOTE — Hospital Course (Addendum)
Patient is a 71 years old male with no significant medical history but no follow-up with PCP, presented to hospital with numbness in his fingers on the right and had a slipped from his bed but did not fall.  His brother called EMS and patient was brought to the hospital for code stroke.  Patient had mild aphasia right upper extremity plegia and right lower extremity weakness and visual neglect.  CT head scan showed acute intraparenchymal hemorrhage around the posterior left frontal lobe with some subarachnoid hemorrhage and intraventricular extension.  There was some mild 3 mm midline shift.  Initial blood pressure was elevated in the 190s.  Patient was then initially admitted by the neurology team in the hospital in the ICU setting.  Patient was subsequently considered stable for transfer out of the ICU after stabilization. ? ?Assessment and plan. ?Principal Problem: ?  ICH (intracerebral hemorrhage) ([place]) ?Active Problems: ?  Protein-calorie malnutrition, severe ?  Paroxysmal atrial fibrillation ([facility]) ?  Hypernatremia ?  Aspiration pneumonia ([place]) ?  ?Left high frontal parenchymal intracranial hemorrhage likely from uncontrolled hypertension with intraventricular extension and subarachnoid hemorrhage. ? ?Neurology followed the patient during hospitalization.  Board.  Patient was not on any anticoagulation prior to this admission.  CT scan repeated showed a stable ICH with no hydrocephalus.  2D echocardiogram with LV ejection fraction of 60 to 65%.  LDL at 118, hemoglobin A1c of 5.6.  Patient has been started on  Lovenox for DVT prophylaxis.  Physical therapy has seen the patient and recommended CIR at this time. ? ?Fever with mild leukocytosis.  Hospitalization.  This has resolved.  Chest x-ray with bibasilar airspace opacities and mild pleural effusion.  Temperature max of 98.5 ?F at this time..  Patient received IV Zosyn per concerns of possible aspiration pneumonia.  Leukocytosis trending down at 13.2 from  13.1< 14.8 <16.4.  Zosyn was initiated 06/09/2021.  Plan for total of 7-day course. ? ?Hypernatremia.  Improved at this time.  Received IV fluids.  Latest sodium of 138 ? ?New-onset atrial fibrillation with RVR.   ?Cardiology on board. Had episodes of atrial flutter.  On oral Cardizem and p.o. amiodarone.  Currently controlled.  Heart rate of 55 ? ?Essential hypertension.   ?Elevated blood pressure on presentation.  Not on medications at home.  On as needed labetalol and hydralazine.  Systolic blood pressure goal less than 160.  Currently on oral Cardizem.  Currently at goal. ? ?Hyperlipidemia.  LDL of 118.  Continue Crestor. ? ?Acute kidney injury.  Resolved.   ? ?Deconditioning, debility ?Will need rehabilitation. ? ?Agitation, delirium. ?Needed restraints and Haldol.   ? ?Nutrition.  Speech therapy reassessment on 06/14/2021 without any improvement.  On Cortrak tube feeding.  Spoke with the patient's sister Hassan Rowan on the phone on 06/14/2021 and the family have decided to proceed with PEG tube placement.  Consulted IR who could not perform PEG tube due to bowels overlying the stomach.  So I have placed the surgery for surgical PEG tube placement ? ?Large left inguinal hernia.  CT scan was performed overnight without any evidence of obstruction.  General surgery will be consulted for PEG tube placement ? ?Goals of care ?Patient will likely have a prolonged course and might benefit from palliative care discussion.  We will consult palliative care today. ?

## 2021-06-11 ENCOUNTER — Inpatient Hospital Stay (HOSPITAL_COMMUNITY): Payer: Medicare Other

## 2021-06-11 DIAGNOSIS — J690 Pneumonitis due to inhalation of food and vomit: Secondary | ICD-10-CM | POA: Diagnosis not present

## 2021-06-11 DIAGNOSIS — I483 Typical atrial flutter: Secondary | ICD-10-CM | POA: Diagnosis not present

## 2021-06-11 DIAGNOSIS — I610 Nontraumatic intracerebral hemorrhage in hemisphere, subcortical: Secondary | ICD-10-CM | POA: Diagnosis not present

## 2021-06-11 DIAGNOSIS — E870 Hyperosmolality and hypernatremia: Secondary | ICD-10-CM | POA: Diagnosis not present

## 2021-06-11 LAB — BASIC METABOLIC PANEL
Anion gap: 5 (ref 5–15)
Anion gap: 4 — ABNORMAL LOW (ref 5–15)
BUN: 35 mg/dL — ABNORMAL HIGH (ref 8–23)
BUN: 31 mg/dL — ABNORMAL HIGH (ref 8–23)
CO2: 23 mmol/L (ref 22–32)
CO2: 24 mmol/L (ref 22–32)
Calcium: 8.1 mg/dL — ABNORMAL LOW (ref 8.9–10.3)
Calcium: 8.3 mg/dL — ABNORMAL LOW (ref 8.9–10.3)
Chloride: 119 mmol/L — ABNORMAL HIGH (ref 98–111)
Chloride: 118 mmol/L — ABNORMAL HIGH (ref 98–111)
Creatinine, Ser: 1.19 mg/dL (ref 0.61–1.24)
Creatinine, Ser: 1 mg/dL (ref 0.61–1.24)
GFR, Estimated: >60 mL/min (ref >60)
GFR, Estimated: >60 mL/min (ref >60)
Glucose, Bld: 208 mg/dL — ABNORMAL HIGH (ref 70–99)
Glucose, Bld: 190 mg/dL — ABNORMAL HIGH (ref 70–99)
Potassium: 4.3 mmol/L (ref 3.5–5.1)
Potassium: 3.9 mmol/L (ref 3.5–5.1)
Sodium: 147 mmol/L — ABNORMAL HIGH (ref 135–145)
Sodium: 146 mmol/L — ABNORMAL HIGH (ref 135–145)

## 2021-06-11 LAB — CBC
HCT: 37.5 % — ABNORMAL LOW (ref 39.0–52.0)
Hemoglobin: 12 g/dL — ABNORMAL LOW (ref 13.0–17.0)
MCH: 30.1 pg (ref 26.0–34.0)
MCHC: 32 g/dL (ref 30.0–36.0)
MCV: 94 fL (ref 80.0–100.0)
Platelets: 195 10*3/uL (ref 150–400)
RBC: 3.99 MIL/uL — ABNORMAL LOW (ref 4.22–5.81)
RDW: 14.7 % (ref 11.5–15.5)
WBC: 16.4 10*3/uL — ABNORMAL HIGH (ref 4.0–10.5)
nRBC: 0 % (ref 0.0–0.2)

## 2021-06-11 LAB — GLUCOSE, CAPILLARY
Glucose-Capillary: 141 mg/dL — ABNORMAL HIGH (ref 70–99)
Glucose-Capillary: 150 mg/dL — ABNORMAL HIGH (ref 70–99)
Glucose-Capillary: 159 mg/dL — ABNORMAL HIGH (ref 70–99)
Glucose-Capillary: 184 mg/dL — ABNORMAL HIGH (ref 70–99)
Glucose-Capillary: 197 mg/dL — ABNORMAL HIGH (ref 70–99)
Glucose-Capillary: 199 mg/dL — ABNORMAL HIGH (ref 70–99)

## 2021-06-11 LAB — MAGNESIUM: Magnesium: 2.3 mg/dL (ref 1.7–2.4)

## 2021-06-11 LAB — PHOSPHORUS: Phosphorus: 3 mg/dL (ref 2.5–4.6)

## 2021-06-11 MED ORDER — AMIODARONE HCL 200 MG PO TABS
400.0000 mg | ORAL_TABLET | Freq: Two times a day (BID) | ORAL | Status: DC
  Administered 2021-06-11 (×2): 400 mg via ORAL
  Filled 2021-06-11 (×2): qty 2

## 2021-06-11 NOTE — Progress Notes (Signed)
HOSPITAL MEDICINE OVERNIGHT EVENT NOTE   ? ?Notified by nursing that patient continues to exhibit agitation with ongoing attempts to pull at medical hardware and attempting to get out of bed.  Patient is not following commands consistently. ?  ?Unfortunately there continues to be no sitter available.  Due to ongoing to the patient current restraint orders will be renewed. ? ?Vernelle Emerald  MD ?[facility]  ? ? ? ? ? ? ? ? ? ? ?

## 2021-06-11 NOTE — Progress Notes (Addendum)
STROKE TEAM PROGRESS NOTE  ? ?INTERVAL HISTORY ?Patient is lying in bed.  He remains aphasic but can be aroused and follows simple midline and occasional left body commands.  Blood pressure adequately controlled.  Afebrile and wbc 16.4.  Na decreased to 147 this morning and D5W d/c'd. ? ?Vitals:  ? 06/11/21 0445 06/11/21 0500 06/11/21 0908 06/11/21 1226  ?BP: 138/64  (!) 162/59 (!) 140/54  ?Pulse: 63  62 63  ?Resp: 20  20   ?Temp: 98 ?F (36.7 ?C)  98.1 ?F (36.7 ?C)   ?TempSrc: Axillary  Axillary   ?SpO2: 97%  97%   ?Weight:  65 kg    ? ?CBC:  ?Recent Labs  ?Lab 06/09/21 ?0409 06/11/21 ?0427  ?WBC 16.5* 16.4*  ?HGB 13.9 12.0*  ?HCT 41.3 37.5*  ?MCV 93.0 94.0  ?PLT 318 195  ? ? ?Basic Metabolic Panel:  ?Recent Labs  ?Lab 06/08/21 ?1606 06/08/21 ?2220 06/10/21 ?1437 06/11/21 ?0427  ?NA  --    < > 151* 147*  ?K  --    < > 3.5 4.3  ?CL  --    < > 123* 119*  ?CO2  --    < > 24 23  ?GLUCOSE  --    < > 170* 208*  ?BUN  --    < > 36* 35*  ?CREATININE  --    < > 1.27* 1.19  ?CALCIUM  --    < > 8.4* 8.1*  ?MG 2.9*  --   --  2.3  ?PHOS 3.6  --   --  3.0  ? < > = values in this interval not displayed.  ? ? ?Lipid Panel:  ?No results for input(s): CHOL, TRIG, HDL, CHOLHDL, VLDL, LDLCALC in the last 168 hours. ? ?HgbA1c:  ?No results for input(s): HGBA1C in the last 168 hours. ? ?Urine Drug Screen:  ?Recent Labs  ?Lab 06/05/21 ?0224  ?LABOPIA NONE DETECTED  ?COCAINSCRNUR NONE DETECTED  ?LABBENZ NONE DETECTED  ?AMPHETMU NONE DETECTED  ?THCU NONE DETECTED  ?LABBARB NONE DETECTED  ? ? ?Alcohol Level No results for input(s): ETH in the last 168 hours. ? ?IMAGING past 24 hours ?DG Abd 1 View ? ?Result Date: 06/11/2021 ?CLINICAL DATA:  Dobbhoff feeding tube placement. EXAM: ABDOMEN - 1 VIEW COMPARISON:  06/08/2021 FINDINGS: Interval placement of Dobbhoff feeding tube with tip just right of midline likely over the mid to distal stomach. Bowel gas pattern is nonobstructive. Remainder the exam is unchanged. IMPRESSION: 1. Dobbhoff feeding  tube with tip just right of midline likely over the mid to distal stomach. Electronically Signed   By: Elberta Fortisaniel  Boyle M.D.   On: 06/11/2021 10:36   ? ?PHYSICAL EXAM ?Constitutional: Elderly white male who is not in distress but mildly agitated this morning ?Eyes: No scleral injection ?Respiratory: Effort normal, non-labored breathing ?Gait and Station: Deferred ? ?Neuro: ?PERRL, drowsy but can be easily aroused.  Will intermittently follow only a few midline and left body commands, expressive aphasia.  Will move LUE and BLE spontaneously, no movement of RUE.  Tongue midline ? ?ASSESSMENT/PLAN ?[name] is a 71 y.o. male with history of not going to PCP in decades presented (3/31) for right sided hemiplegia and numbness after 1 day of hand tingling. Admitted for left high frontal ICH. 12hrs MRI showed ICH stable without hydrocephalus or herniation. CT head on 4/1 shows stable ICH. Patient in SVT vs afib w/ RVR, cardiology consulted on 4/3. NG placed 4/4, Cardizem transitioned to PO.  ?  ? ?  Stroke: Left high frontal parenchymal ICH presumably from uncontrolled hypertension with mild cytotoxic edema and trace intraventricular extension but no hydrocephalus ?code Stroke CT head:  Acute intraparenchymal hemorrhage centered at the high posterior left frontal lobe measures 4.0 x 3.2 x 4.5 cm. Surrounding low-density vasogenic edema. Associated small volume subarachnoid hemorrhage within the adjacent left frontal region. Intraventricular extension with blood seen within the left greater than right lateral ventricles. ICH volume ~ 29 mL ?CT head at 3hrs and 8hrs after: Stable ICH ?MRI: Stable ICH, no hydrocephalus, no herniations ?MRA: No large vessel stenosis or occlusion.  No aneurysms.   ?2D Echo: Left ventricular ejection fraction 60-65%.   ?Portable CXR 1 view - 06/09/21 - Right basilar airspace opacities and small right pleural effusion, could be atelectasis or pneumonia. ?U/A - 06/09/21 - not C/W UTI ?LDL 118   (06/04/2021) ?HgbA1c 5.6 (06/04/2021)  ?VTE prophylaxis - Enoxaparin (lovenox) injection 40 mg start: 06/08/21 1200 ?Scd's start: 06/04/21 0052, Sequential compression devices, below knee, Bilateral lower extremities,  ?Diet: Dysph 3 ?No antithrombotic prior to admission, no antithrombotic now secondary to ICH ?Therapy recommendations: CIR ?Disposition: Acute inpatient rehab (3hours/day) Pending ? ?New Onset Atrial fibrillation ?EKG shows afib w/ RVR ?Cardizem gtt with one time IVP ordered for rate control ?Cardiology consulted 06/07/21  ?Rate currently controlled ?Infusion transitioned to PO Cardizem on 4/4. PRN medication for rate control ordered.  ?120mg  q6hrs ? ?Hypertension ?Home meds: N/A  ?Unstable, still requiring cleviprex ?Long-term BP goal normotensive ?PRN labetalol, hydralazine  ?SBP goal <160  ? ?Hyperlipidemia ?Home meds:  N/A ?LDL 118  (06/04/2021), goal < 70  ?Will add statin at discharge ? ?Other Stroke Risk Factors ?Advanced Age >/= 50  ?Family hx stroke - family history is not on file.  ?Has not seen PCP in decades ?UDS - negative ? ?Other Active Problems ?SVT vs afib?  ?Patient had an episode of SVT with rate to the 180s on 4/1.  This resolved with labetalol administration ?Agitation ?Clonidine, seroquel, haldol  ?Precedex resulted in bradycardia with HR 30 ?Delirium precautions  ?Leukocytosis - WBC's -  17.7->14.8->16.5 (temp - 101.1->98.6->100.4->100->99.8) Currently not on abxs.  ?Portable CXR and UA - as above - Zosyn started 06/09/21 ?NG placed 4/4 ?Hypokalemia - potassium - 3.3->3.5 corrected ?Hypermagnesemia - Magnesium - 2.9 ?Hypernatremia  ?Na - 158 -> 156 -> 151 (Na Q 6 hrs) ?FW 200q4  ?D5W given for 24 hours on 4/6 ?Hypophosphatemia - Phosphorus - 2.2->3.6 corrected ?AKI ?creatinine - 1.44->1.5->1.27- > 1.19 ? ?Appreciate Hospitalist's assistance ? ?Hospital day # 7 ? ?Patient seen and examined by NP/APP with MD. MD to update note as needed.  ? ?6/6, DNP, FNP-BC ?[facility] ?Pager: [phone] ? ?I have personally obtained history,examined this patient, reviewed notes, independently viewed imaging studies, participated in medical decision making and plan of care.ROS completed by me personally and pertinent positives fully documented  I have made any additions or clarifications directly to the above note. Agree with note above.  Patient remains aphasic with dense right hemiplegia and dysphagia.  Continue core track tube feedings but speech therapy to reevaluate on Monday and if still not able to swallow safely may need a PEG tube.  Continue treatment for aspiration pneumonia and medical issues as per primary team.  Discussed with Dr. Wednesday.  Greater than 50% time during this 35-minute visit was spent in counseling and coordination of care and discussion about his aphasia, it and aphasia, dysphagia and answering questions ? ?Karin Golden, MD ?  Medical Director ?[facility] ?Pager: [phone] ?06/11/2021 3:09 PM ? ? ? ?

## 2021-06-11 NOTE — Progress Notes (Signed)
SLP Cancellation Note ? ?Patient Details ?Name: Louis Moore ?MRN: [id number] ?DOB: [birth year] ? ? ?Cancelled treatment:       Reason Eval/Treat Not Completed: Fatigue/lethargy limiting ability to participate (Case discussed with RN who advised that the pt remains lethagic this morning and that Cortrak placement is being attempted. SLP will follow up next week unless contacted sooner due to improvement in alertness.) ? ?Reggie Welge I. Vear Clock, MS, CCC-SLP ?Acute Rehabilitation Services ?Office number [phone] ?Pager [phone] ? ?Scheryl Marten ?06/11/2021, 9:41 AM ?

## 2021-06-11 NOTE — Progress Notes (Signed)
Occupational Therapy Treatment ?Patient Details ?Name: Louis Moore ?MRN: [id number] ?DOB: [birth year] ?Today's Date: 06/11/2021 ? ? ?History of present illness Patient is a 71 y/o male admitted with R hemiplegia and numbness found to have high L frontal ICH w/ mild cytotoxic edema, trace IV extension and 69mm midline shift. Stated on hypertonic saline, cleviprex.  No PMH as has not seen PCP in decades. ?  ?OT comments ? Pt very lethargic this session. He did not open his eyes any this session, however responded to pain and when a wash cloth was placed in his hand, he was able to gently wipe his face. Pt is requiring total assist +2 for all bed mobility this session, including long sitting and rolling. Updated discharge recommendation to SNF, as pt is unable to tolerate AIR therapies at this time. OT will continue to follow acutely.   ? ?Recommendations for follow up therapy are one component of a multi-disciplinary discharge planning process, led by the attending physician.  Recommendations may be updated based on patient status, additional functional criteria and insurance authorization. ?   ?Follow Up Recommendations ? Skilled nursing-short term rehab (<3 hours/day)  ?  ?Assistance Recommended at Discharge Frequent or constant Supervision/Assistance  ?Patient can return home with the following ? Two people to help with walking and/or transfers;A lot of help with bathing/dressing/bathroom;Assistance with feeding;Direct supervision/assist for medications management;Direct supervision/assist for financial management;Assist for transportation;Help with stairs or ramp for entrance ?  ?Equipment Recommendations ? BSC/3in1;Wheelchair (measurements OT);Wheelchair cushion (measurements OT);Hospital bed  ?  ?Recommendations for Other Services   ? ?  ?Precautions / Restrictions Precautions ?Precautions: Fall ?Precaution Comments: R neglect, hemiparesis, NG tube ?Restrictions ?Weight Bearing Restrictions: No  ? ? ?  ? ?Mobility  Bed Mobility ?Overal bed mobility: Needs Assistance ?Bed Mobility: Rolling ?Rolling: Total assist ?  ?  ?  ?  ?General bed mobility comments: total assist using bed pad to roll for positioning into L sidelying at end of session. ?  ? ?Transfers ?  ?  ?  ?  ?  ?  ?  ?  ?  ?General transfer comment: Bed placed in chair position. +2 total assist using bed pad to pull trunk forward off bed. ?  ?  ?Balance Overall balance assessment: Needs assistance ?  ?Sitting balance-Leahy Scale: Zero ?  ?  ?  ?  ?  ?  ?  ?  ?  ?  ?  ?  ?  ?  ?  ?  ?   ? ?ADL either performed or assessed with clinical judgement  ? ?ADL Overall ADL's : Needs assistance/impaired ?  ?  ?Grooming: Maximal assistance ?Grooming Details (indicate cue type and reason): OT place wet washcloth in pt's hand, he was able to bring it to his face and gently wipe his face for approximately 30 secs. Unable to follow commands on where on his face to wipe ?  ?  ?  ?  ?  ?  ?  ?  ?  ?  ?  ?  ?  ?  ?  ?General ADL Comments: Pt requiring max-total A +1-2 for all ADL's and mobility ?  ? ?Extremity/Trunk Assessment   ?  ?  ?  ?  ?  ? ?Vision   ?  ?  ?Perception   ?  ?Praxis   ?  ? ?Cognition Arousal/Alertness: Lethargic ?Behavior During Therapy: Flat affect ?Overall Cognitive Status: Difficult to assess ?  ?  ?  ?  ?  ?  ?  ?  ?  ?  ?  ?  ?  ?  ?  ?  ?  General Comments: Very lethargic. Coretrak placement this AM. Eyes closed throughout session. Followed commands to wash face with washcloth. ?  ?  ?   ?Exercises Exercises: Other exercises ?Other Exercises ?Other Exercises: AAROM/PROM LUE - shoulder flexion, elbow flex/ext, gripping ?Other Exercises: PROM RUE: shoulderflexion, elbow flex/ext, opening and closing hand ? ?  ?Shoulder Instructions   ? ? ?  ?General Comments VSS on 2L  ? ? ?Pertinent Vitals/ Pain       Pain Assessment ?Pain Assessment: Faces ?Faces Pain Scale: Hurts little more ?Pain Location: generalized with mobility ?Pain Descriptors / Indicators:  Grimacing ?Pain Intervention(s): Limited activity within patient's tolerance, Monitored during session, Repositioned ? ?Home Living   ?  ?  ?  ?  ?  ?  ?  ?  ?  ?  ?  ?  ?  ?  ?  ?  ?  ?  ? ?  ?Prior Functioning/Environment    ?  ?  ?  ?   ? ?Frequency ? Min 2X/week  ? ? ? ? ?  ?Progress Toward Goals ? ?OT Goals(current goals can now be found in the care plan section) ? Progress towards OT goals: Not progressing toward goals - comment (Pt very lethargic this session) ? ?Acute Rehab OT Goals ?Patient Stated Goal: none stated ?OT Goal Formulation: Patient unable to participate in goal setting ?Time For Goal Achievement: 06/21/21 ?Potential to Achieve Goals: Fair ?ADL Goals ?Pt Will Perform Eating: with set-up;with adaptive utensils;sitting ?Pt Will Perform Grooming: with supervision;sitting ?Pt Will Perform Upper Body Dressing: sitting;with min guard assist ?Pt Will Perform Lower Body Dressing: with mod assist;with caregiver independent in assisting;sitting/lateral leans ?Pt Will Transfer to Toilet: with min assist;stand pivot transfer;bedside commode ?Pt Will Perform Toileting - Clothing Manipulation and hygiene: with mod assist;sit to/from stand ?Additional ADL Goal #1: Pt will attend to his right side of his body for ADL 50% of the time  ?Plan Frequency remains appropriate;Discharge plan needs to be updated   ? ?Co-evaluation ? ? ? PT/OT/SLP Co-Evaluation/Treatment: Yes ?Reason for Co-Treatment: Necessary to address cognition/behavior during functional activity;For patient/therapist safety;To address functional/ADL transfers ?PT goals addressed during session: Mobility/safety with mobility;Balance ?OT goals addressed during session: ADL's and self-care;Strengthening/ROM ?  ? ?  ?AM-PAC OT "6 Clicks" Daily Activity     ?Outcome Measure ? ? Help from another person eating meals?: Total ?Help from another person taking care of personal grooming?: A Lot ?Help from another person toileting, which includes using toliet,  bedpan, or urinal?: Total ?Help from another person bathing (including washing, rinsing, drying)?: A Lot ?Help from another person to put on and taking off regular upper body clothing?: Total ?Help from another person to put on and taking off regular lower body clothing?: Total ?6 Click Score: 8 ? ?  ?End of Session Equipment Utilized During Treatment: Oxygen ? ?OT Visit Diagnosis: Unsteadiness on feet (R26.81);Other abnormalities of gait and mobility (R26.89);Muscle weakness (generalized) (M62.81);Other symptoms and signs involving the nervous system (R29.898);Other symptoms and signs involving cognitive function;Hemiplegia and hemiparesis ?Hemiplegia - Right/Left: Right ?Hemiplegia - dominant/non-dominant: Dominant ?Hemiplegia - caused by: Nontraumatic SAH ?  ?Activity Tolerance Patient limited by lethargy ?  ?Patient Left in bed;with call bell/phone within reach;with bed alarm set ?  ?Nurse Communication Mobility status ?  ? ?   ? ?Time: 2297-9892 ?OT Time Calculation (min): 23 min ? ?Charges: OT General Charges ?$OT Visit: 1 Visit ?OT Treatments ?$Self Care/Home Management : 8-22 mins ? ?Tamirra Sienkiewicz H., OTR/L ?Acute  Rehabilitation ? ?Lilibeth Opie Elane Bing [facility]umeHaynes ?06/11/2021, 1:47 PM ?

## 2021-06-11 NOTE — Progress Notes (Signed)
? ?Progress Note ? ?Patient Name: Louis Moore ?Date of Encounter: 06/11/2021 ? ?Primary Cardiologist:   None ? ? ?Subjective  ? ?Unable to wake him up this morning.  Sleeping and in no distress.   ? ?Inpatient Medications  ?  ?Scheduled Meds: ? bethanechol  10 mg Oral TID  ? Chlorhexidine Gluconate Cloth  6 each Topical Daily  ? diltiazem  120 mg Oral Q6H  ? enoxaparin (LOVENOX) injection  40 mg Subcutaneous Q24H  ? feeding supplement (PROSource TF)  90 mL Per Tube BID  ? free water  200 mL Per Tube Q4H  ? pantoprazole sodium  40 mg Per Tube QHS  ? thiamine  100 mg Per Tube Daily  ? ?Continuous Infusions: ? feeding supplement (OSMOLITE 1.5 CAL) 1,000 mL (06/08/21 1130)  ? piperacillin-tazobactam (ZOSYN)  IV 3.375 g (06/11/21 0502)  ? ?PRN Meds: ?acetaminophen **OR** acetaminophen (TYLENOL) oral liquid 160 mg/5 mL **OR** acetaminophen, food thickener, haloperidol lactate, hydrALAZINE, labetalol  ? ?Vital Signs  ?  ?Vitals:  ? 06/11/21 0033 06/11/21 0109 06/11/21 0445 06/11/21 0500  ?BP: (!) 123/57 92/65 138/64   ?Pulse: (!) 58 86 63   ?Resp: 20 20 20    ?Temp: 99.4 ?F (37.4 ?C) 99.4 ?F (37.4 ?C) 98 ?F (36.7 ?C)   ?TempSrc: Axillary Axillary Axillary   ?SpO2: 93% 100% 97%   ?Weight:    65 kg  ? ? ?Intake/Output Summary (Last 24 hours) at 06/11/2021 0943 ?Last data filed at 06/11/2021 0346 ?Gross per 24 hour  ?Intake --  ?Output 1250 ml  ?Net -1250 ml  ? ?Filed Weights  ? 06/04/21 0000 06/10/21 0553 06/11/21 0500  ?Weight: 65.6 kg 63.4 kg 65 kg  ? ? ?Telemetry  ?  ?Paroxysmal flutter with rapid rate.   - Personally Reviewed ? ?ECG  ?  ?NA ? ?Physical Exam  ? ?GEN: No  acute distress.   ?Neck:   Unable to assess JVD ?Cardiac: RRR, no murmurs, rubs, or gallops.  ?Respiratory: Clear   to auscultation bilaterally. ?GI: Soft, nontender, non-distended, normal bowel sounds  ?MS:  No edema; No deformity. ?Neuro:   Sleeping.   ?Psych:   Sleeping ? ? ?Labs  ?  ?Chemistry ?Recent Labs  ?Lab 06/09/21 ?0409 06/09/21 ?1001  06/10/21 ?0932 06/10/21 ?1437 06/11/21 ?0427  ?NA 158*   < > 156* 151* 147*  ?K 3.5  --   --  3.5 4.3  ?CL 129*  --   --  123* 119*  ?CO2 22  --   --  24 23  ?GLUCOSE 215*  --   --  170* 208*  ?BUN 40*  --   --  36* 35*  ?CREATININE 1.51*  --   --  1.27* 1.19  ?CALCIUM 8.9  --   --  8.4* 8.1*  ?GFRNONAA 49*  --   --  >60 >60  ?ANIONGAP 7  --   --  4* 5  ? < > = values in this interval not displayed.  ?  ? ?Hematology ?Recent Labs  ?Lab 06/08/21 ?[id number] 06/09/21 ?[id number] 06/11/21 ?[id number]  ?WBC 14.8* 16.5* 16.4*  ?RBC 4.48 4.44 3.99*  ?HGB 13.8 13.9 12.0*  ?HCT 41.2 41.3 37.5*  ?MCV 92.0 93.0 94.0  ?MCH 30.8 31.3 30.1  ?MCHC 33.5 33.7 32.0  ?RDW 15.1 15.4 14.7  ?PLT 318 318 195  ? ? ?Cardiac EnzymesNo results for input(s): TROPONINI in the last 168 hours. No results for input(s): TROPIPOC in the last 168 hours.  ? ?  BNPNo results for input(s): BNP, PROBNP in the last 168 hours.  ? ?DDimer No results for input(s): DDIMER in the last 168 hours.  ? ?Radiology  ?  ?No results found. ? ?Cardiac Studies  ? ?ECHO:     1. Left ventricular ejection fraction, by estimation, is 60 to 65%. The  ?left ventricle has normal function. The left ventricle has no regional  ?wall motion abnormalities. Left ventricular diastolic parameters were  ?normal.  ? 2. Right ventricular systolic function is normal. The right ventricular  ?size is normal. Tricuspid regurgitation signal is inadequate for assessing  ?PA pressure.  ? 3. Left atrial size was mildly dilated.  ? 4. The mitral valve is grossly normal. Trivial mitral valve  ?regurgitation. No evidence of mitral stenosis.  ? 5. The aortic valve is grossly normal. There is mild calcification of the  ?aortic valve. There is mild thickening of the aortic valve. Aortic valve  ?regurgitation is mild. No aortic stenosis is present.  ? 6. The inferior vena cava is normal in size with greater than 50%  ?respiratory variability, suggesting right atrial pressure of 3 mmHg.  ? ?Patient Profile  ?   ?71 y.o.  male without prior cardiac history presents with hemorrhagic stroke.  We are seeing because of PAF with rapid rate.  ? ?Assessment & Plan  ?  ?Atrial fib/flutter  with RVR:    Has periods of flutter with variable conduction some 2:1.  I will suggest PO amiodarone for the short term.   ?  ?Acute intraparenchymal hemorrhage L posterior frontal lobe:    No anticoagulation.   ?  ?HTN:  BP is requiring controlled with IR Cardizem.  Change to CD before discharge.  ? ?Dyslipidemia:    Crestor started this admission. ? ? ?For questions or updates, please contact [place] ?Please consult [email or web address] for contact info under Cardiology/STEMI. ?  ?Signed, ?Minus Breeding, MD  ?06/11/2021, 9:43 AM   ? ?

## 2021-06-11 NOTE — Progress Notes (Signed)
?PROGRESS NOTE ? ? ? ?Louis Moore  [id number] DOB: 02-May-1950 DOA: 06/04/2021 ?PCP: Pcp, No  ? ? ?Brief Narrative:  ?Patient is a 71 years old male with no significant medical history but no follow-up with PCP presented to hospital with numbness in his fingers on the right and had a slipper from his bed but did not fall.  His brother called EMS and patient was brought to the hospital for code stroke.  Patient had mild aphasia right upper extremity plegia and right lower extremity weakness and visual neglect.  CT head scan showed acute intraparenchymal hemorrhage around the posterior left frontal lobe with some subarachnoid hemorrhage and intraventricular extension.  There was some mild 3 mm midline shift.  Initial blood pressure was elevated in the 190s.  Patient was then initially admitted by the neurology team in the hospital in the ICU setting.  Patient was subsequently considered stable for transfer out of the ICU. ? ?Assessment and plan. ?Principal Problem: ?  ICH (intracerebral hemorrhage) ([place]) ?Active Problems: ?  Protein-calorie malnutrition, severe ?  Paroxysmal atrial fibrillation ([facility]) ?  Hypernatremia ?  Aspiration pneumonia ([place]) ?  ?Left high frontal parenchymal intracranial hemorrhage likely from uncontrolled hypertension with intraventricular extension and subarachnoid hemorrhage. ? ?Neurology on board.  Patient was not on any anticoagulation prior to this admission.  CT scan repeated showed a stable ICH with no hydrocephalus.  2D echocardiogram with LV ejection fraction of 60 to 65%.  LDL at 118 hemoglobin A1c of 5.6.  Patient has been started on a Lovenox for DVT prophylaxis.  Physical therapy has seen the patient and recommended CIR at this time. ? ?Fever with mild leukocytosis.  Chest x-ray with bibasilar airspace opacities and mild pleural effusion.  Temperature max of 99.4 today.  Patient has been started on IV Zosyn per concerns of possible aspiration pneumonia.  Still with significant  leukocytosis at 16.4. ? ?Hypernatremia.  Receiving water flushes.  Temporarily received D5 water.  Sodium level has improved to 147 at this time.  We will continue to monitor. ? ?New-onset atrial fibrillation with RVR.  Cardiology on board. Has episodes of atrial flutter.  On oral Cardizem at this time.  Cardiology also recommending p.o. amiodarone. ? ?Essential hypertension.  Elevated blood pressure on presentation.  Not on medications at home.  On as needed labetalol and hydralazine.  Systolic blood pressure goal less than 160.  Currently on oral Cardizem. ? ?Hyperlipidemia.  LDL of 118.  Crestor has been initiated. ? ?Acute kidney injury.  Creatinine from 06/09/2021 at 1.5.  Received IV fluids and water flushes.  Creatinine has improved to 1.1 today. ? ?Deconditioning, debility ?We will need rehabilitation ? ?Agitation, delirium. ?On restraints and Haldol.   ? ?Nutrition.  On tube feeding.  Tolerating so far.  Continue.  Have ordered cortrak tube tube placement.  If no improvement in his mental status, in few days might need PEG tube placement. ?  ? ?  DVT prophylaxis: enoxaparin (LOVENOX) injection 40 mg Start: 06/08/21 1200 ?SCD's Start: 06/04/21 0052 ? ?Code Status:   ?  Code Status: Full Code ? ?Disposition: CIR as per PT recommendation. ? ?Status is: Inpatient ? ?Remains inpatient appropriate because: Intracranial hemorrhage, hypernatremia, need for rehabilitation, ? ? Family Communication: ? I spoke with the patient's brother on the phone and updated her about the clinical condition of the patient. I also spoke with the patient's sister on the phone and updated her about the clinical condition of the patient. ? ?Consultants:  ?  Neurology ?Cardiology ? ?Procedures:  ?NG tube placement, ? ?Antimicrobials:  ?Zosyn IV ? ?Anti-infectives (From admission, onward)  ? ? Start     Dose/Rate Route Frequency Ordered Stop  ? 06/09/21 1400  piperacillin-tazobactam (ZOSYN) IVPB 3.375 g       ? 3.375 g ?12.5 mL/hr over 240  Minutes Intravenous Every 8 hours 06/09/21 1324    ? ?  ? ?Subjective: ?Today, patient is seen and examined at bedside.  Patient has some slurred speech and was able to communicate some.  He states that he knows that he is in the hospital.  Mumbling voice. ? ?Objective: ?Vitals:  ? 06/11/21 0445 06/11/21 0500 06/11/21 0908 06/11/21 1226  ?BP: 138/64  (!) 162/59 (!) 140/54  ?Pulse: 63  62 63  ?Resp: 20  20   ?Temp: 98 ?F (36.7 ?C)  98.1 ?F (36.7 ?C)   ?TempSrc: Axillary  Axillary   ?SpO2: 97%  97%   ?Weight:  65 kg    ? ? ?Intake/Output Summary (Last 24 hours) at 06/11/2021 1324 ?Last data filed at 06/11/2021 0346 ?Gross per 24 hour  ?Intake --  ?Output 1250 ml  ?Net -1250 ml  ? ?Filed Weights  ? 06/04/21 0000 06/10/21 0553 06/11/21 0500  ?Weight: 65.6 kg 63.4 kg 65 kg  ? ? ?Physical Examination: ? ?General:  Average built, not in obvious distress, mildly verbal, opens eyes spontaneously, ?HENT:   No scleral pallor or icterus noted. Oral mucosa is moist.  NG tube in place. ?Chest:    Diminished breath sounds bilaterally.  Mildly tachypnea, coarse breath sounds noted. ?CVS: S1 &S2 heard. No murmur.  Irregular rhythm. ?Abdomen: Soft, nontender, nondistended.  Bowel sounds are heard. Abdominal restrains   ?Extremities: No cyanosis, clubbing or edema.  Peripheral pulses are palpable. ?Psych: Alert, awake , mild verbal, mumbling speech. ?CNS:  Right dense hemiplegia. Increased Left sided tone. ?Skin: Warm and dry.  No rashes noted. ? ? ?Data Reviewed:  ? ?CBC: ?Recent Labs  ?Lab 06/06/21 ?0428 06/07/21 ?0431 06/08/21 ?[id number] 06/09/21 ?[id number] 06/11/21 ?[id number]  ?WBC 11.5* 16.3* 14.8* 16.5* 16.4*  ?HGB 11.1* 13.8 13.8 13.9 12.0*  ?HCT 33.3* 40.6 41.2 41.3 37.5*  ?MCV 92.0 91.4 92.0 93.0 94.0  ?PLT 264 326 318 318 195  ? ? ?Basic Metabolic Panel: ?Recent Labs  ?Lab 06/07/21 ?0431 06/07/21 ?1001 06/07/21 ?1540 06/07/21 ?2121 06/08/21 ?[id number] 06/08/21 ?[id number] 06/08/21 ?1606 06/08/21 ?2220 06/09/21 ?0409 06/09/21 ?1001 06/09/21 ?2147  06/10/21 ?0415 06/10/21 ?0932 06/10/21 ?1437 06/11/21 ?0427  ?NA 155* 156* 157*   < > 159*   < >  --    < > 158*   < > 158* 158* 156* 151* 147*  ?K 3.5  --   --   --  3.3*  --   --   --  3.5  --   --   --   --  3.5 4.3  ?CL 124*  --   --   --  129*  --   --   --  129*  --   --   --   --  123* 119*  ?CO2 19*  --   --   --  20*  --   --   --  22  --   --   --   --  24 23  ?GLUCOSE 98  --   --   --  113*  --   --   --  215*  --   --   --   --  170* 208*  ?BUN 22  --   --   --  35*  --   --   --  40*  --   --   --   --  36* 35*  ?CREATININE 1.05  --   --   --  1.44*  --   --   --  1.51*  --   --   --   --  1.27* 1.19  ?CALCIUM 9.2  --   --   --  9.1  --   --   --  8.9  --   --   --   --  8.4* 8.1*  ?MG  --  2.7* 2.7*  --  2.9*  --  2.9*  --   --   --   --   --   --   --  2.3  ?PHOS  --  2.1* 2.2*  --  2.2*  --  3.6  --   --   --   --   --   --   --  3.0  ? < > = values in this interval not displayed.  ? ? ?Liver Function Tests: ?No results for input(s): AST, ALT, ALKPHOS, BILITOT, PROT, ALBUMIN in the last 168 hours. ? ? ? ?Radiology Studies: ?DG Abd 1 View ? ?Result Date: 06/11/2021 ?CLINICAL DATA:  Dobbhoff feeding tube placement. EXAM: ABDOMEN - 1 VIEW COMPARISON:  06/08/2021 FINDINGS: Interval placement of Dobbhoff feeding tube with tip just right of midline likely over the mid to distal stomach. Bowel gas pattern is nonobstructive. Remainder the exam is unchanged. IMPRESSION: 1. Dobbhoff feeding tube with tip just right of midline likely over the mid to distal stomach. Electronically Signed   By: Marin Olp M.D.   On: 06/11/2021 10:36   ? ? ? LOS: 7 days  ? ? ?Flora Lipps, MD ?[facility] ?06/11/2021, 1:24 PM  ? ? ?

## 2021-06-11 NOTE — Progress Notes (Signed)
Physical Therapy Treatment ?Patient Details ?Name: Louis Moore ?MRN: [id number] ?DOB: 07-Feb-1951 ?Today's Date: 06/11/2021 ? ? ?History of Present Illness Patient is a 71 y/o male admitted with R hemiplegia and numbness found to have high L frontal ICH w/ mild cytotoxic edema, trace IV extension and 33mm midline shift. Stated on hypertonic saline, cleviprex.  No PMH as has not seen PCP in decades. ? ?  ?PT Comments  ? ? Pt seen in session with OT to address mobility and ADLs. Pt very lethargic following cortrak placement this AM. Bed placed in chair position. +2 total assist to pull trunk forward off bed x 3 trials. No active participation from pt. Eyes remained closed throughout session. Pt did follow commands and actively complete lightly rubbing his eyes with a wet washcloth. HOB returned to 30 degrees. Pt positioned in R sidelying: pillows behind R shoulder and hips, RUE and RLE supported on pillows. ?   ?Recommendations for follow up therapy are one component of a multi-disciplinary discharge planning process, led by the attending physician.  Recommendations may be updated based on patient status, additional functional criteria and insurance authorization. ? ?Follow Up Recommendations ? Acute inpatient rehab (3hours/day) ?  ?  ?Assistance Recommended at Discharge Frequent or constant Supervision/Assistance  ?Patient can return home with the following Two people to help with walking and/or transfers;Two people to help with bathing/dressing/bathroom;Assistance with cooking/housework;Direct supervision/assist for medications management;Assist for transportation;Help with stairs or ramp for entrance;Direct supervision/assist for financial management;Assistance with feeding ?  ?Equipment Recommendations ? Other (comment) (TBA)  ?  ?Recommendations for Other Services   ? ? ?  ?Precautions / Restrictions Precautions ?Precautions: Fall;Other (comment) ?Precaution Comments: R neglect, hemiparesis, NG  tube ?Restrictions ?Weight Bearing Restrictions: No  ?  ? ?Mobility ? Bed Mobility ?Overal bed mobility: Needs Assistance ?Bed Mobility: Rolling ?Rolling: Total assist ?  ?  ?  ?  ?General bed mobility comments: total assist using bed pad to roll for positioning into L sidelying at end of session. ?  ? ?Transfers ?  ?  ?  ?  ?  ?  ?  ?  ?  ?General transfer comment: Bed placed in chair position. +2 total assist using bed pad to pull trunk forward off bed. ?  ? ?Ambulation/Gait ?  ?  ?  ?  ?  ?  ?  ?  ? ? ?Stairs ?  ?  ?  ?  ?  ? ? ?Wheelchair Mobility ?  ? ?Modified Rankin (Stroke Patients Only) ?Modified Rankin (Stroke Patients Only) ?Pre-Morbid Rankin Score: No symptoms ?Modified Rankin: Severe disability ? ? ?  ?Balance Overall balance assessment: Needs assistance ?  ?Sitting balance-Leahy Scale: Zero ?  ?  ?  ?  ?  ?  ?  ?  ?  ?  ?  ?  ?  ?  ?  ?  ?  ? ?  ?Cognition Arousal/Alertness: Lethargic ?Behavior During Therapy: Flat affect ?Overall Cognitive Status: Difficult to assess ?  ?  ?  ?  ?  ?  ?  ?  ?  ?  ?  ?  ?  ?  ?  ?  ?General Comments: Very lethargic. Coretrak placement this AM. Eyes closed throughout session. Followed commands to wash face with washcloth. ?  ?  ? ?  ?Exercises Other Exercises ?Other Exercises: gentle PROM RUE/LE ? ?  ?General Comments General comments (skin integrity, edema, etc.): Pt on 2L continuous O2. ?  ?  ? ?Pertinent  Vitals/Pain Pain Assessment ?Pain Assessment: Faces ?Faces Pain Scale: Hurts little more ?Pain Location: generalized with mobility ?Pain Descriptors / Indicators: Grimacing ?Pain Intervention(s): Monitored during session, Repositioned  ? ? ?Home Living   ?  ?  ?  ?  ?  ?  ?  ?  ?  ?   ?  ?Prior Function    ?  ?  ?   ? ?PT Goals (current goals can now be found in the care plan section) Acute Rehab PT Goals ?Patient Stated Goal: unable to state ?Progress towards PT goals: Not progressing toward goals - comment (lethargic today following cortrak placement) ? ?   ?Frequency ? ? ? Min 4X/week ? ? ? ?  ?PT Plan Current plan remains appropriate  ? ? ?Co-evaluation PT/OT/SLP Co-Evaluation/Treatment: Yes ?Reason for Co-Treatment: Complexity of the patient's impairments (multi-system involvement);For patient/therapist safety;Necessary to address cognition/behavior during functional activity;To address functional/ADL transfers ?PT goals addressed during session: Mobility/safety with mobility;Balance ?  ?  ? ?  ?AM-PAC PT "6 Clicks" Mobility   ?Outcome Measure ? Help needed turning from your back to your side while in a flat bed without using bedrails?: Total ?Help needed moving from lying on your back to sitting on the side of a flat bed without using bedrails?: Total ?Help needed moving to and from a bed to a chair (including a wheelchair)?: Total ?Help needed standing up from a chair using your arms (e.g., wheelchair or bedside chair)?: Total ?Help needed to walk in hospital room?: Total ?Help needed climbing 3-5 steps with a railing? : Total ?6 Click Score: 6 ? ?  ?End of Session Equipment Utilized During Treatment: Oxygen ?Activity Tolerance: Patient limited by fatigue;Patient limited by lethargy ?Patient left: in bed;with call bell/phone within reach;with bed alarm set ?Nurse Communication: Mobility status ?PT Visit Diagnosis: Other abnormalities of gait and mobility (R26.89);Hemiplegia and hemiparesis;Other symptoms and signs involving the nervous system (R29.898) ?Hemiplegia - Right/Left: Right ?Hemiplegia - dominant/non-dominant: Dominant ?Hemiplegia - caused by: Nontraumatic intracerebral hemorrhage ?  ? ? ?Time: [id number] ?PT Time Calculation (min) (ACUTE ONLY): 23 min ? ?Charges:  $Therapeutic Activity: 8-22 mins          ?          ? ?Lorrin Goodell, PT  ?Office # [phone] ?Pager [phone] ? ? ? ?Lorriane Shire ?06/11/2021, 12:06 PM ? ?

## 2021-06-11 NOTE — Procedures (Signed)
Cortrak  Person Inserting Tube:  Onyx Edgley D, RD Tube Type:  Cortrak - 43 inches Tube Size:  10 Tube Location:  Left nare Secured by: Bridle Technique Used to Measure Tube Placement:  Marking at nare/corner of mouth Cortrak Secured At:  64 cm  Cortrak Tube Team Note:  Consult received to place a Cortrak feeding tube.   X-ray is required, abdominal x-ray has been ordered by the Cortrak team. Please confirm tube placement before using the Cortrak tube.   If the tube becomes dislodged please keep the tube and contact the Cortrak team at [email or web address] (password [email or web address]) for replacement.  If after hours and replacement cannot be delayed, place a NG tube and confirm placement with an abdominal x-ray.    Almando Brawley, RD, LDN Clinical Dietitian RD pager # available in AMION  After hours/weekend pager # available in AMION 

## 2021-06-12 DIAGNOSIS — J690 Pneumonitis due to inhalation of food and vomit: Secondary | ICD-10-CM | POA: Diagnosis not present

## 2021-06-12 DIAGNOSIS — I480 Paroxysmal atrial fibrillation: Secondary | ICD-10-CM | POA: Diagnosis not present

## 2021-06-12 DIAGNOSIS — I610 Nontraumatic intracerebral hemorrhage in hemisphere, subcortical: Secondary | ICD-10-CM | POA: Diagnosis not present

## 2021-06-12 DIAGNOSIS — E870 Hyperosmolality and hypernatremia: Secondary | ICD-10-CM | POA: Diagnosis not present

## 2021-06-12 DIAGNOSIS — I483 Typical atrial flutter: Secondary | ICD-10-CM | POA: Diagnosis not present

## 2021-06-12 LAB — CBC
HCT: 36 % — ABNORMAL LOW (ref 39.0–52.0)
Hemoglobin: 11.7 g/dL — ABNORMAL LOW (ref 13.0–17.0)
MCH: 30.5 pg (ref 26.0–34.0)
MCHC: 32.5 g/dL (ref 30.0–36.0)
MCV: 94 fL (ref 80.0–100.0)
Platelets: 208 10*3/uL (ref 150–400)
RBC: 3.83 MIL/uL — ABNORMAL LOW (ref 4.22–5.81)
RDW: 14.2 % (ref 11.5–15.5)
WBC: 14.8 10*3/uL — ABNORMAL HIGH (ref 4.0–10.5)
nRBC: 0 % (ref 0.0–0.2)

## 2021-06-12 LAB — TSH: TSH: 1.267 u[IU]/mL (ref 0.350–4.500)

## 2021-06-12 LAB — GLUCOSE, CAPILLARY
Glucose-Capillary: 148 mg/dL — ABNORMAL HIGH (ref 70–99)
Glucose-Capillary: 151 mg/dL — ABNORMAL HIGH (ref 70–99)
Glucose-Capillary: 153 mg/dL — ABNORMAL HIGH (ref 70–99)
Glucose-Capillary: 156 mg/dL — ABNORMAL HIGH (ref 70–99)
Glucose-Capillary: 158 mg/dL — ABNORMAL HIGH (ref 70–99)
Glucose-Capillary: 162 mg/dL — ABNORMAL HIGH (ref 70–99)

## 2021-06-12 LAB — MAGNESIUM: Magnesium: 2.2 mg/dL (ref 1.7–2.4)

## 2021-06-12 MED ORDER — AMIODARONE HCL 200 MG PO TABS: 200.0000 mg | ORAL_TABLET | Freq: Every day | ORAL | Status: DC

## 2021-06-12 MED ORDER — DILTIAZEM HCL ER COATED BEADS 120 MG PO CP24
120.0000 mg | ORAL_CAPSULE | Freq: Every day | ORAL | Status: DC
  Administered 2021-06-12 – 2021-06-15 (×4): 120 mg via ORAL
  Filled 2021-06-12 (×5): qty 1

## 2021-06-12 MED ORDER — AMIODARONE HCL 200 MG PO TABS
400.0000 mg | ORAL_TABLET | Freq: Two times a day (BID) | ORAL | Status: DC
  Administered 2021-06-12 – 2021-06-14 (×5): 400 mg via ORAL
  Filled 2021-06-12 (×5): qty 2

## 2021-06-12 NOTE — Progress Notes (Signed)
Pharmacy Antibiotic Note ? ?Louis Moore is a 71 y.o. male admitted on 06/04/2021 with  aspiration pneumonia .  WBC 14.8, afebrile, SCr back to baseline. Pharmacy has been consulted for Zosyn dosing. ? ?Plan: ?- Continue piperacillin/tazobactam 3.375 g Q8H (extended-infusion) ?- Monitor clinical status, renal function, cultures, and length of therapy ?- Follow plans to de-escalate or add a stop date ? ?Weight: 64.3 kg (141 lb 12.1 oz) ? ?Temp (24hrs), Avg:98.4 ?F (36.9 ?C), Min:97.8 ?F (36.6 ?C), Max:98.9 ?F (37.2 ?C) ? ?Recent Labs  ?Lab 06/07/21 ?0431 06/08/21 ?1610 06/09/21 ?0409 06/10/21 ?1437 06/11/21 ?0427 06/11/21 ?1711 06/12/21 ?0156  ?WBC 16.3* 14.8* 16.5*  --  16.4*  --  14.8*  ?CREATININE 1.05 1.44* 1.51* 1.27* 1.19 1.00  --   ?  ?CrCl cannot be calculated (Unknown ideal weight.).   ? ?No Known Allergies ? ?Antimicrobials this admission: ?Piperacillin/tazobactam 4/5 >> ? ?Microbiology results: ?4/1 MRSA PCR: negative ? ?Thank you for allowing pharmacy to be a part of this patient?s care. ? ?Sanda Klein, PharmD, RPh  ?PGY-2 Pharmacy Resident ?06/12/2021 11:21 AM ? ?Please check [email or web address] for unit-specific pharmacy phone numbers. ? ?

## 2021-06-12 NOTE — Progress Notes (Signed)
HOSPITAL MEDICINE OVERNIGHT EVENT NOTE   ? ?Nursing reports that patient continues to attempt to pull at medical hardware with the left upper extremity.  Patient is not redirectable and there is no sitter available. ? ?We will renew left wrist restraint order. ? ?Talib Headley J Dustyn Dansereau  MD ?[facility]  ? ? ? ? ? ? ? ? ? ? ?

## 2021-06-12 NOTE — Progress Notes (Addendum)
?Cardiology Progress Note  ?Patient ID: Louis Moore ?MRN: [id number] ?DOB: 1950-08-31 ?Date of Encounter: 06/12/2021 ? ?Primary Cardiologist: None ? ?Subjective  ? ?Chief Complaint: Confused. ? ?HPI: Quite confused this morning.  Not able to answer questions appropriately.  Noted to defecate on himself.  Remaining in sinus rhythm. ? ?ROS:  ?All other ROS reviewed and negative. Pertinent positives noted in the HPI.    ? ?Inpatient Medications  ?Scheduled Meds: ? amiodarone  400 mg Oral BID  ? bethanechol  10 mg Oral TID  ? Chlorhexidine Gluconate Cloth  6 each Topical Daily  ? diltiazem  120 mg Oral Q6H  ? enoxaparin (LOVENOX) injection  40 mg Subcutaneous Q24H  ? feeding supplement (PROSource TF)  90 mL Per Tube BID  ? free water  200 mL Per Tube Q4H  ? pantoprazole sodium  40 mg Per Tube QHS  ? thiamine  100 mg Per Tube Daily  ? ?Continuous Infusions: ? feeding supplement (OSMOLITE 1.5 CAL) 1,000 mL (06/12/21 0629)  ? piperacillin-tazobactam (ZOSYN)  IV 3.375 g (06/12/21 0529)  ? ?PRN Meds: ?acetaminophen **OR** acetaminophen (TYLENOL) oral liquid 160 mg/5 mL **OR** acetaminophen, food thickener, haloperidol lactate, hydrALAZINE, labetalol  ? ?Vital Signs  ? ?Vitals:  ? 06/12/21 0017 06/12/21 0400 06/12/21 0500 06/12/21 0738  ?BP: (!) 123/57 140/60  (!) 142/70  ?Pulse: 67 60  65  ?Resp:    16  ?Temp: 98.4 ?F (36.9 ?C) 98.5 ?F (36.9 ?C)  98.9 ?F (37.2 ?C)  ?TempSrc: Oral Oral  Oral  ?SpO2: 97% 97%  97%  ?Weight:   64.3 kg   ? ? ?Intake/Output Summary (Last 24 hours) at 06/12/2021 0953 ?Last data filed at 06/12/2021 [id number] ?Gross per 24 hour  ?Intake 1549.07 ml  ?Output 1900 ml  ?Net -350.93 ml  ? ? ?  06/12/2021  ?  5:00 AM 06/11/2021  ?  5:00 AM 06/10/2021  ?  5:53 AM  ?Last 3 Weights  ?Weight (lbs) 141 lb 12.1 oz 143 lb 4.8 oz 139 lb 12.4 oz  ?Weight (kg) 64.3 kg 65 kg 63.4 kg  ?   ? ?Telemetry  ?Overnight telemetry shows sinus rhythm in the 60s, which I personally reviewed.  ? ?Physical Exam  ? ?Vitals:  ? 06/12/21 0017  06/12/21 0400 06/12/21 0500 06/12/21 0738  ?BP: (!) 123/57 140/60  (!) 142/70  ?Pulse: 67 60  65  ?Resp:    16  ?Temp: 98.4 ?F (36.9 ?C) 98.5 ?F (36.9 ?C)  98.9 ?F (37.2 ?C)  ?TempSrc: Oral Oral  Oral  ?SpO2: 97% 97%  97%  ?Weight:   64.3 kg   ?  ?Intake/Output Summary (Last 24 hours) at 06/12/2021 0953 ?Last data filed at 06/12/2021 [id number] ?Gross per 24 hour  ?Intake 1549.07 ml  ?Output 1900 ml  ?Net -350.93 ml  ?  ? ?  06/12/2021  ?  5:00 AM 06/11/2021  ?  5:00 AM 06/10/2021  ?  5:53 AM  ?Last 3 Weights  ?Weight (lbs) 141 lb 12.1 oz 143 lb 4.8 oz 139 lb 12.4 oz  ?Weight (kg) 64.3 kg 65 kg 63.4 kg  ?  There is no height or weight on file to calculate BMI.  ?General: Well nourished, well developed, in no acute distress ?Head: Atraumatic, normal size  ?Eyes: PEERLA, EOMI  ?Neck: Supple, no JVD ?Endocrine: No thryomegaly ?Cardiac: Normal S1, S2; RRR; no murmurs, rubs, or gallops ?Lungs: Clear to auscultation bilaterally, no wheezing, rhonchi or rales  ?Abd: Soft, nontender, no  hepatomegaly  ?Ext: No edema, pulses 2+ ?Musculoskeletal: No deformities, BUE and BLE strength normal and equal ?Skin: Warm and dry, no rashes   ?Neuro: Alert and oriented to person, place, time, and situation, CNII-XII grossly intact, no focal deficits  ?Psych: Normal mood and affect  ? ?Labs  ?High Sensitivity Troponin:  No results for input(s): TROPONINIHS in the last 720 hours.   ?Cardiac EnzymesNo results for input(s): TROPONINI in the last 168 hours. No results for input(s): TROPIPOC in the last 168 hours.  ?Chemistry ?Recent Labs  ?Lab 06/10/21 ?1437 06/11/21 ?0427 06/11/21 ?1711  ?NA 151* 147* 146*  ?K 3.5 4.3 3.9  ?CL 123* 119* 118*  ?CO2 24 23 24   ?GLUCOSE 170* 208* 190*  ?BUN 36* 35* 31*  ?CREATININE 1.27* 1.19 1.00  ?CALCIUM 8.4* 8.1* 8.3*  ?GFRNONAA >60 >60 >60  ?ANIONGAP 4* 5 4*  ?  ?Hematology ?Recent Labs  ?Lab 06/09/21 ?0409 06/11/21 ?0427 06/12/21 ?0156  ?WBC 16.5* 16.4* 14.8*  ?RBC 4.44 3.99* 3.83*  ?HGB 13.9 12.0* 11.7*  ?HCT 41.3 37.5*  36.0*  ?MCV 93.0 94.0 94.0  ?MCH 31.3 30.1 30.5  ?MCHC 33.7 32.0 32.5  ?RDW 15.4 14.7 14.2  ?PLT 318 195 208  ? ?BNPNo results for input(s): BNP, PROBNP in the last 168 hours.  ?DDimer No results for input(s): DDIMER in the last 168 hours.  ? ?Radiology  ?DG Abd 1 View ? ?Result Date: 06/11/2021 ?CLINICAL DATA:  Dobbhoff feeding tube placement. EXAM: ABDOMEN - 1 VIEW COMPARISON:  06/08/2021 FINDINGS: Interval placement of Dobbhoff feeding tube with tip just right of midline likely over the mid to distal stomach. Bowel gas pattern is nonobstructive. Remainder the exam is unchanged. IMPRESSION: 1. Dobbhoff feeding tube with tip just right of midline likely over the mid to distal stomach. Electronically Signed   By: Marin Olp M.D.   On: 06/11/2021 10:36   ? ?Cardiac Studies  ?TTE 06/04/2021 ? 1. Left ventricular ejection fraction, by estimation, is 60 to 65%. The  ?left ventricle has normal function. The left ventricle has no regional  ?wall motion abnormalities. Left ventricular diastolic parameters were  ?normal.  ? 2. Right ventricular systolic function is normal. The right ventricular  ?size is normal. Tricuspid regurgitation signal is inadequate for assessing  ?PA pressure.  ? 3. Left atrial size was mildly dilated.  ? 4. The mitral valve is grossly normal. Trivial mitral valve  ?regurgitation. No evidence of mitral stenosis.  ? 5. The aortic valve is grossly normal. There is mild calcification of the  ?aortic valve. There is mild thickening of the aortic valve. Aortic valve  ?regurgitation is mild. No aortic stenosis is present.  ? 6. The inferior vena cava is normal in size with greater than 50%  ?respiratory variability, suggesting right atrial pressure of 3 mmHg.  ? ?Patient Profile  ?Louis Moore is a 71 y.o. male with likely untreated hypertension who was admitted on 06/04/2021 for stroke secondary to acute intraparenchymal hemorrhage.  Cardiology was consulted for A-fib. ? ?Assessment & Plan  ? ?#Atrial  fibrillation ?-Suspect his A-fib is secondary to critical illness.  I have checked a TSH.  Echo shows normal LV function with no significant valvular heart disease. ?-Placed on amiodarone per primary team yesterday.  We will plan for amiodarone 40 mg twice daily for 7 days and then 200 mg daily.  This will help maintain sinus rhythm through his acute illness. ?-Anticoagulation is contraindicated in the setting of intraparenchymal hemorrhage.  Would  recommend he avoid this moving forward as well. ?-He is quite confused today.  Suspect this is related to worsening delirium after stroke.  Management per hospital team. ?-EF is normal.  No significant valvular heart disease. ?-I have reduced his Cardizem dose to extended release tablet 120 mg daily.  Should be okay. ? ?#Hypertension ?-Add medications as needed. ? ?[facility] will sign off.   ?Medication Recommendations: Amiodarone as above.  Cardizem as above.  No anticoagulation. ?Other recommendations (labs, testing, etc): None. ?Follow up as an outpatient: Please notify us once he is closer to discharge.  Cardiology will sign off for now.  We will arrange hospital follow-up once he is closer to this. ? ?For questions or updates, please contact [place] ?Please consult [email or web address] for contact info under  ? ?Signed, ?[place] T. Audie Box, MD, [facility] ?[place]  ?06/12/2021 9:53 AM  ? ?

## 2021-06-12 NOTE — Progress Notes (Signed)
STROKE TEAM PROGRESS NOTE  ? ?INTERVAL HISTORY ?Patient is lying in bed.  He remains aphasic but can be aroused and follows simple midline and occasional left body commands.  Blood pressure adequately controlled.  ? ? ? ?Vitals:  ? 06/12/21 0400 06/12/21 0500 06/12/21 0738 06/12/21 1205  ?BP: 140/60  (!) 142/70 (!) 160/57  ?Pulse: 60  65 63  ?Resp:   16 16  ?Temp: 98.5 ?F (36.9 ?C)  98.9 ?F (37.2 ?C) 98.9 ?F (37.2 ?C)  ?TempSrc: Oral  Oral Oral  ?SpO2: 97%  97% 99%  ?Weight:  64.3 kg    ? ?CBC:  ?Recent Labs  ?Lab 06/11/21 ?0427 06/12/21 ?0156  ?WBC 16.4* 14.8*  ?HGB 12.0* 11.7*  ?HCT 37.5* 36.0*  ?MCV 94.0 94.0  ?PLT 195 208  ? ? ?Basic Metabolic Panel:  ?Recent Labs  ?Lab 06/08/21 ?1606 06/08/21 ?2220 06/11/21 ?0427 06/11/21 ?1711 06/12/21 ?0156  ?NA  --    < > 147* 146*  --   ?K  --    < > 4.3 3.9  --   ?CL  --    < > 119* 118*  --   ?CO2  --    < > 23 24  --   ?GLUCOSE  --    < > 208* 190*  --   ?BUN  --    < > 35* 31*  --   ?CREATININE  --    < > 1.19 1.00  --   ?CALCIUM  --    < > 8.1* 8.3*  --   ?MG 2.9*  --  2.3  --  2.2  ?PHOS 3.6  --  3.0  --   --   ? < > = values in this interval not displayed.  ? ? ?Lipid Panel:  ?No results for input(s): CHOL, TRIG, HDL, CHOLHDL, VLDL, LDLCALC in the last 168 hours. ? ?HgbA1c:  ?No results for input(s): HGBA1C in the last 168 hours. ? ?Urine Drug Screen:  ?No results for input(s): LABOPIA, COCAINSCRNUR, LABBENZ, AMPHETMU, THCU, LABBARB in the last 168 hours. ? ?Alcohol Level No results for input(s): ETH in the last 168 hours. ? ?IMAGING past 24 hours ?No results found. ? ?PHYSICAL EXAM ?Constitutional: Elderly white male who is not in distress but mildly agitated this morning ?Eyes: No scleral injection ?Respiratory: Effort normal, non-labored breathing ?Gait and Station: Deferred ? ?Neuro: ?PERRL, drowsy but can be easily aroused.  Will intermittently follow only a few midline and left body commands, expressive aphasia.  Will move LUE and BLE spontaneously, no movement  of RUE.  Tongue midline ? ?ASSESSMENT/PLAN ?[name] is a 71 y.o. male with history of not going to PCP in decades presented (3/31) for right sided hemiplegia and numbness after 1 day of hand tingling. Admitted for left high frontal ICH. 12hrs MRI showed ICH stable without hydrocephalus or herniation. CT head on 4/1 shows stable ICH. Patient in SVT vs afib w/ RVR, cardiology consulted on 4/3. NG placed 4/4, Cardizem transitioned to PO.  ?  ? ?Stroke: Left high frontal parenchymal ICH presumably from uncontrolled hypertension with mild cytotoxic edema and trace intraventricular extension but no hydrocephalus ?code Stroke CT head:  Acute intraparenchymal hemorrhage centered at the high posterior left frontal lobe measures 4.0 x 3.2 x 4.5 cm. Surrounding low-density vasogenic edema. Associated small volume subarachnoid hemorrhage within the adjacent left frontal region. Intraventricular extension with blood seen within the left greater than right lateral ventricles. ICH volume ~ 29  mL ?CT head at 3hrs and 8hrs after: Stable ICH ?MRI: Stable ICH, no hydrocephalus, no herniations ?MRA: No large vessel stenosis or occlusion.  No aneurysms.   ?2D Echo: Left ventricular ejection fraction 60-65%.   ?Portable CXR 1 view - 06/09/21 - Right basilar airspace opacities and small right pleural effusion, could be atelectasis or pneumonia. ?U/A - 06/09/21 - not C/W UTI ?LDL 118  (06/04/2021) ?HgbA1c 5.6 (06/04/2021)  ?VTE prophylaxis - Enoxaparin (lovenox) injection 40 mg start: 06/08/21 1200 ?Scd's start: 06/04/21 0052, Sequential compression devices, below knee,  ?Diet: Dysph 3 ?No antithrombotic prior to admission, no antithrombotic now secondary to [place] ?Therapy recommendations: CIR ?Disposition: Acute inpatient rehab (3hours/day) Pending ? ?New Onset Atrial fibrillation ?EKG shows afib w/ RVR ?Cardizem gtt with one time IVP ordered for rate control ?Cardiology consulted 06/07/21  ?Rate currently controlled ?Infusion  transitioned to PO Cardizem on 4/4. PRN medication for rate control ordered.  ?120mg  q6hrs ? ?Hypertension ?Home meds: N/A  ?Unstable, still requiring cleviprex ?Long-term BP goal normotensive ?PRN labetalol, hydralazine  ?SBP goal <160  ? ?Hyperlipidemia ?Home meds:  N/A ?LDL 118  (06/04/2021), goal < 70  ?Will add statin at discharge ? ?Other Stroke Risk Factors ?Advanced Age >/= 41  ?Family hx stroke - family history is not on file.  ?Has not seen PCP in decades ?UDS - negative ? ?Other Active Problems ?SVT vs afib?  ?Patient had an episode of SVT with rate to the 180s on 4/1.  This resolved with labetalol administration ?Agitation ?Clonidine, seroquel, haldol  ?Precedex resulted in bradycardia with HR 30 ?Delirium precautions  ?Leukocytosis - WBC's -  17.7->14.8->16.5 (temp - 101.1->98.6->100.4->100->99.8) Currently not on abxs.  ?Portable CXR and UA - as above - Zosyn started 06/09/21 ?NG placed 4/4 ?Hypokalemia - potassium - 3.3->3.5 corrected ?Hypermagnesemia - Magnesium - 2.9 ?Hypernatremia  ?Na - 158 -> 156 -> 151 (Na Q 6 hrs) ?FW 200q4  ?D5W given for 24 hours on 4/6 ?Hypophosphatemia - Phosphorus - 2.2->3.6 corrected ?AKI ?creatinine - 1.44->1.5->1.27- > 1.19 ? ?Appreciate Hospitalist's assistance ? ?Hospital day # 9 ? ? ? ?

## 2021-06-12 NOTE — Progress Notes (Signed)
?PROGRESS NOTE ? ? ? ?Louis Moore  [id number] DOB: 06-14-1950 DOA: 06/04/2021 ?PCP: Pcp, No  ? ? ?Brief Narrative:  ?Patient is a 71 years old male with no significant medical history but no follow-up with PCP presented to hospital with numbness in his fingers on the right and had a slipper from his bed but did not fall.  His brother called EMS and patient was brought to the hospital for code stroke.  Patient had mild aphasia right upper extremity plegia and right lower extremity weakness and visual neglect.  CT head scan showed acute intraparenchymal hemorrhage around the posterior left frontal lobe with some subarachnoid hemorrhage and intraventricular extension.  There was some mild 3 mm midline shift.  Initial blood pressure was elevated in the 190s.  Patient was then initially admitted by the neurology team in the hospital in the ICU setting.  Patient was subsequently considered stable for transfer out of the ICU. ? ?Assessment and plan. ?Principal Problem: ?  ICH (intracerebral hemorrhage) ([place]) ?Active Problems: ?  Protein-calorie malnutrition, severe ?  Paroxysmal atrial fibrillation (HCC) ?  Hypernatremia ?  Aspiration pneumonia ([place]) ?  ?Left high frontal parenchymal intracranial hemorrhage likely from uncontrolled hypertension with intraventricular extension and subarachnoid hemorrhage. ? ?Neurology on board.  Patient was not on any anticoagulation prior to this admission.  CT scan repeated showed a stable ICH with no hydrocephalus.  2D echocardiogram with LV ejection fraction of 60 to 65%.  LDL at 118 hemoglobin A1c of 5.6.  Patient has been started on a Lovenox for DVT prophylaxis.  Physical therapy has seen the patient and recommended CIR at this time. ? ?Fever with mild leukocytosis.  Chest x-ray with bibasilar airspace opacities and mild pleural effusion.  Temperature max of 98.9 ?F.  Patient has been started on IV Zosyn per concerns of possible aspiration pneumonia.  Leukocytosis trending down  at 14.8 from 16.4. ? ?Hypernatremia.  Receiving water flushes.  Temporarily received D5 water.  Sodium level has improved to 146 at this time.  We will continue to monitor. ? ?New-onset atrial fibrillation with RVR.  Cardiology on board. Had episodes of atrial flutter.  On oral Cardizem at this time.  Cardiology also recommending p.o. amiodarone.  On normal sinus rhythm today. ? ?Essential hypertension.  Elevated blood pressure on presentation.  Not on medications at home.  On as needed labetalol and hydralazine.  Systolic blood pressure goal less than 160.  Currently on oral Cardizem. ? ?Hyperlipidemia.  LDL of 118.  Continue Crestor. ? ?Acute kidney injury.  Creatinine from 06/09/2021 at 1.5.  Received IV fluids and water flushes.  Creatinine has improved to 1.0 today. ? ?Deconditioning, debility ?Will need rehabilitation. ? ?Agitation, delirium. ?On restraints and Haldol.   ? ?Nutrition.  On tube feeding.  Cortrak tube tube placement if no improvement in his mental status, in few days might need PEG tube placement. ?  ? ?  DVT prophylaxis: enoxaparin (LOVENOX) injection 40 mg Start: 06/08/21 1200 ?SCD's Start: 06/04/21 0052 ? ?Code Status:   ?  Code Status: Full Code ? ?Disposition: CIR as per PT recommendation. ? ?Status is: Inpatient ? ?Remains inpatient appropriate because: Intracranial hemorrhage, hypernatremia, need for rehabilitation, ? ? Family Communication: ?I spoke with the patient's brother and sister on the phone on 07/01/2021 ? ?Consultants:  ?Neurology ?Cardiology ? ?Procedures:  ?NG tube placement and removal ?Cortrak tube tube placement ? ?Antimicrobials:  ?Zosyn IV ? ?Anti-infectives (From admission, onward)  ? ? Start  Dose/Rate Route Frequency Ordered Stop  ? 06/09/21 1400  piperacillin-tazobactam (ZOSYN) IVPB 3.375 g       ? 3.375 g ?12.5 mL/hr over 240 Minutes Intravenous Every 8 hours 06/09/21 1324    ? ?  ? ?Subjective: ?Today, patient was seen and examined at bedside.  Appears sleepy.   States no to questions.  Mumbled speech. ? ?Objective: ?Vitals:  ? 06/12/21 0017 06/12/21 0400 06/12/21 0500 06/12/21 0738  ?BP: (!) 123/57 140/60  (!) 142/70  ?Pulse: 67 60  65  ?Resp:    16  ?Temp: 98.4 ?F (36.9 ?C) 98.5 ?F (36.9 ?C)  98.9 ?F (37.2 ?C)  ?TempSrc: Oral Oral  Oral  ?SpO2: 97% 97%  97%  ?Weight:   64.3 kg   ? ? ?Intake/Output Summary (Last 24 hours) at 06/12/2021 1142 ?Last data filed at 06/12/2021 [id number] ?Gross per 24 hour  ?Intake 1549.07 ml  ?Output 1900 ml  ?Net -350.93 ml  ? ?Filed Weights  ? 06/10/21 0553 06/11/21 0500 06/12/21 0500  ?Weight: 63.4 kg 65 kg 64.3 kg  ? ? ?Physical Examination: ? ?General:  Average built, not in obvious distress, little more somnolent.  Answering no. ?HENT:   No scleral pallor or icterus noted. Oral mucosa is moist.  ?Chest: Decreased breath sounds bilaterally.  Coarse breath sounds noted ?CVS: S1 &S2 heard. No murmur.  Regular rate and rhythm. ?Abdomen: Soft, nontender, nondistended.  Bowel sounds are heard.   ?Extremities: No cyanosis, clubbing or edema.  Peripheral pulses are palpable. ?Psych: Somnolent today. ?CNS: Right-sided hemiplegia, increased tone on the left side. ?Skin: Warm and dry.  No rashes noted. ? ?Data Reviewed:  ? ?CBC: ?Recent Labs  ?Lab 06/07/21 ?0431 06/08/21 ?[id number] 06/09/21 ?[id number] 06/11/21 ?[id number] 06/12/21 ?[id number]  ?WBC 16.3* 14.8* 16.5* 16.4* 14.8*  ?HGB 13.8 13.8 13.9 12.0* 11.7*  ?HCT 40.6 41.2 41.3 37.5* 36.0*  ?MCV 91.4 92.0 93.0 94.0 94.0  ?PLT 326 318 318 195 208  ? ? ?Basic Metabolic Panel: ?Recent Labs  ?Lab 06/07/21 ?1001 06/07/21 ?1540 06/07/21 ?2121 06/08/21 ?[id number] 06/08/21 ?[id number] 06/08/21 ?1606 06/08/21 ?2220 06/09/21 ?0409 06/09/21 ?1001 06/10/21 ?0415 06/10/21 ?0932 06/10/21 ?1437 06/11/21 ?0427 06/11/21 ?1711 06/12/21 ?0156  ?NA 156* 157*   < > 159*   < >  --    < > 158*   < > 158* 156* 151* 147* 146*  --   ?K  --   --   --  3.3*  --   --   --  3.5  --   --   --  3.5 4.3 3.9  --   ?CL  --   --   --  129*  --   --   --  129*  --   --   --   123* 119* 118*  --   ?CO2  --   --   --  20*  --   --   --  22  --   --   --  24 23 24   --   ?GLUCOSE  --   --   --  113*  --   --   --  215*  --   --   --  170* 208* 190*  --   ?BUN  --   --   --  35*  --   --   --  40*  --   --   --  36* 35* 31*  --   ?CREATININE  --   --   --  1.44*  --   --   --  1.51*  --   --   --  1.27* 1.19 1.00  --   ?CALCIUM  --   --   --  9.1  --   --   --  8.9  --   --   --  8.4* 8.1* 8.3*  --   ?MG 2.7* 2.7*  --  2.9*  --  2.9*  --   --   --   --   --   --  2.3  --  2.2  ?PHOS 2.1* 2.2*  --  2.2*  --  3.6  --   --   --   --   --   --  3.0  --   --   ? < > = values in this interval not displayed.  ? ? ?Liver Function Tests: ?No results for input(s): AST, ALT, ALKPHOS, BILITOT, PROT, ALBUMIN in the last 168 hours. ? ? ? ?Radiology Studies: ?DG Abd 1 View ? ?Result Date: 06/11/2021 ?CLINICAL DATA:  Dobbhoff feeding tube placement. EXAM: ABDOMEN - 1 VIEW COMPARISON:  06/08/2021 FINDINGS: Interval placement of Dobbhoff feeding tube with tip just right of midline likely over the mid to distal stomach. Bowel gas pattern is nonobstructive. Remainder the exam is unchanged. IMPRESSION: 1. Dobbhoff feeding tube with tip just right of midline likely over the mid to distal stomach. Electronically Signed   By: Marin Olp M.D.   On: 06/11/2021 10:36   ? ? ? LOS: 8 days  ? ? ?Flora Lipps, MD ?[facility] ?06/12/2021, 11:42 AM  ? ? ?

## 2021-06-13 DIAGNOSIS — J690 Pneumonitis due to inhalation of food and vomit: Secondary | ICD-10-CM | POA: Diagnosis not present

## 2021-06-13 DIAGNOSIS — I483 Typical atrial flutter: Secondary | ICD-10-CM | POA: Diagnosis not present

## 2021-06-13 DIAGNOSIS — E870 Hyperosmolality and hypernatremia: Secondary | ICD-10-CM | POA: Diagnosis not present

## 2021-06-13 DIAGNOSIS — I610 Nontraumatic intracerebral hemorrhage in hemisphere, subcortical: Secondary | ICD-10-CM | POA: Diagnosis not present

## 2021-06-13 LAB — CBC
HCT: 36.3 % — ABNORMAL LOW (ref 39.0–52.0)
Hemoglobin: 12 g/dL — ABNORMAL LOW (ref 13.0–17.0)
MCH: 30.5 pg (ref 26.0–34.0)
MCHC: 33.1 g/dL (ref 30.0–36.0)
MCV: 92.1 fL (ref 80.0–100.0)
Platelets: 229 10*3/uL (ref 150–400)
RBC: 3.94 MIL/uL — ABNORMAL LOW (ref 4.22–5.81)
RDW: 13.4 % (ref 11.5–15.5)
WBC: 13.1 10*3/uL — ABNORMAL HIGH (ref 4.0–10.5)
nRBC: 0 % (ref 0.0–0.2)

## 2021-06-13 LAB — GLUCOSE, CAPILLARY
Glucose-Capillary: 139 mg/dL — ABNORMAL HIGH (ref 70–99)
Glucose-Capillary: 143 mg/dL — ABNORMAL HIGH (ref 70–99)
Glucose-Capillary: 149 mg/dL — ABNORMAL HIGH (ref 70–99)
Glucose-Capillary: 163 mg/dL — ABNORMAL HIGH (ref 70–99)
Glucose-Capillary: 191 mg/dL — ABNORMAL HIGH (ref 70–99)
Glucose-Capillary: 198 mg/dL — ABNORMAL HIGH (ref 70–99)

## 2021-06-13 LAB — BASIC METABOLIC PANEL
Anion gap: 5 (ref 5–15)
BUN: 28 mg/dL — ABNORMAL HIGH (ref 8–23)
CO2: 23 mmol/L (ref 22–32)
Calcium: 8.2 mg/dL — ABNORMAL LOW (ref 8.9–10.3)
Chloride: 113 mmol/L — ABNORMAL HIGH (ref 98–111)
Creatinine, Ser: 0.94 mg/dL (ref 0.61–1.24)
GFR, Estimated: >60 mL/min (ref >60)
Glucose, Bld: 167 mg/dL — ABNORMAL HIGH (ref 70–99)
Potassium: 4 mmol/L (ref 3.5–5.1)
Sodium: 141 mmol/L (ref 135–145)

## 2021-06-13 LAB — MAGNESIUM: Magnesium: 2.4 mg/dL (ref 1.7–2.4)

## 2021-06-13 MED ORDER — LIP MEDEX EX OINT
TOPICAL_OINTMENT | CUTANEOUS | Status: DC | PRN
  Filled 2021-06-13: qty 7

## 2021-06-13 NOTE — Progress Notes (Signed)
HOSPITAL MEDICINE OVERNIGHT EVENT NOTE   ? ?Nursing reports that patient continues to attempt to pull at medical hardware with the left upper extremity.  Patient is not redirectable and there is no sitter available. ? ?We will renew left wrist restraint order. ? ?Vernelle Emerald  MD ?[facility]  ? ? ? ? ? ? ? ? ? ? ?

## 2021-06-13 NOTE — Progress Notes (Signed)
STROKE TEAM PROGRESS NOTE  ? ?INTERVAL HISTORY ?Patient is lying in bed.  He remains aphasic but can be aroused and follows simple midline and  left body commands.  Blood pressure adequately controlled.  ? ? ? ?Vitals:  ? 06/13/21 0400 06/13/21 0752 06/13/21 1038 06/13/21 1131  ?BP:  (!) 161/76 (!) 141/70 (!) 144/69  ?Pulse:  75 68 71  ?Resp:  16  16  ?Temp:  98.2 ?F (36.8 ?C)  98.6 ?F (37 ?C)  ?TempSrc:  Oral  Oral  ?SpO2:  97%  98%  ?Weight: 64.2 kg     ? ?CBC:  ?Recent Labs  ?Lab 06/12/21 ?0156 06/13/21 ?0609  ?WBC 14.8* 13.1*  ?HGB 11.7* 12.0*  ?HCT 36.0* 36.3*  ?MCV 94.0 92.1  ?PLT 208 229  ? ? ?Basic Metabolic Panel:  ?Recent Labs  ?Lab 06/08/21 ?1606 06/08/21 ?2220 06/11/21 ?0427 06/11/21 ?1711 06/12/21 ?0350 06/13/21 ?0938  ?NA  --    < > 147* 146*  --  141  ?K  --    < > 4.3 3.9  --  4.0  ?CL  --    < > 119* 118*  --  113*  ?CO2  --    < > 23 24  --  23  ?GLUCOSE  --    < > 208* 190*  --  167*  ?BUN  --    < > 35* 31*  --  28*  ?CREATININE  --    < > 1.19 1.00  --  0.94  ?CALCIUM  --    < > 8.1* 8.3*  --  8.2*  ?MG 2.9*  --  2.3  --  2.2 2.4  ?PHOS 3.6  --  3.0  --   --   --   ? < > = values in this interval not displayed.  ? ? ?Lipid Panel:  ?No results for input(s): CHOL, TRIG, HDL, CHOLHDL, VLDL, LDLCALC in the last 168 hours. ? ?HgbA1c:  ?No results for input(s): HGBA1C in the last 168 hours. ? ?Urine Drug Screen:  ?No results for input(s): LABOPIA, COCAINSCRNUR, LABBENZ, AMPHETMU, THCU, LABBARB in the last 168 hours. ? ?Alcohol Level No results for input(s): ETH in the last 168 hours. ? ?IMAGING past 24 hours ?No results found. ? ?PHYSICAL EXAM ?Constitutional: Elderly white male who is not in distress but mildly agitated this morning ?Eyes: No scleral injection ?Respiratory: Effort normal, non-labored breathing ?Gait and Station: Deferred ? ?Neuro: ?PERRL, drowsy but can be easily aroused.  Will intermittently follow only a few midline and left body commands, expressive aphasia although he did say  simple words today that he was doing fine.  Will move LUE and BLE spontaneously, no movement of RUE.  Tongue midline ? ?ASSESSMENT/PLAN ?[name] is a 71 y.o. male with history of not going to PCP in decades presented (3/31) for right sided hemiplegia and numbness after 1 day of hand tingling. Admitted for left high frontal ICH. 12hrs MRI showed ICH stable without hydrocephalus or herniation. CT head on 4/1 shows stable ICH. Patient in SVT vs afib w/ RVR, cardiology consulted on 4/3. NG placed 4/4, Cardizem transitioned to PO.  ?  ? ?Stroke: Left high frontal parenchymal ICH presumably from uncontrolled hypertension with mild cytotoxic edema and trace intraventricular extension but no hydrocephalus ?code Stroke CT head:  Acute intraparenchymal hemorrhage centered at the high posterior left frontal lobe measures 4.0 x 3.2 x 4.5 cm. Surrounding low-density vasogenic edema. Associated small volume subarachnoid  hemorrhage within the adjacent left frontal region. Intraventricular extension with blood seen within the left greater than right lateral ventricles. ICH volume ~ 29 mL ?CT head at 3hrs and 8hrs after: Stable ICH ?MRI: Stable ICH, no hydrocephalus, no herniations ?MRA: No large vessel stenosis or occlusion.  No aneurysms.   ?2D Echo: Left ventricular ejection fraction 60-65%.   ?Portable CXR 1 view - 06/09/21 - Right basilar airspace opacities and small right pleural effusion, could be atelectasis or pneumonia. ?U/A - 06/09/21 - not C/W UTI ?LDL 118  (06/04/2021) ?HgbA1c 5.6 (06/04/2021)  ?VTE prophylaxis - Enoxaparin (lovenox) injection 40 mg start: 06/08/21 1200 ?Scd's start: 06/04/21 0052, Sequential compression devices, below knee,  ?Diet: Dysph 3 ?No antithrombotic prior to admission, no antithrombotic now secondary to [place] ?Therapy recommendations: CIR ?Disposition: Acute inpatient rehab (3hours/day) Pending ? ?New Onset Atrial fibrillation ?EKG shows afib w/ RVR ?Cardizem gtt with one time IVP ordered  for rate control ?Cardiology consulted 06/07/21  ?Rate currently controlled ?Infusion transitioned to PO Cardizem on 4/4. PRN medication for rate control ordered.  ?120mg  q6hrs ? ?Hypertension ?Home meds: N/A  ?Long-term BP goal normotensive ?PRN labetalol, hydralazine  ?SBP goal <160  ? ?Hyperlipidemia ?Home meds:  N/A ?LDL 118  (06/04/2021), goal < 70  ?Will add statin at discharge ? ?Other Stroke Risk Factors ?Advanced Age >/= 25  ?Family hx stroke - family history is not on file.  ?Has not seen PCP in decades ?UDS - negative ? ?Other Active Problems ?SVT vs afib?  ?Patient had an episode of SVT with rate to the 180s on 4/1.  This resolved with labetalol administration ?Agitation ?Clonidine, seroquel, haldol  ?Precedex resulted in bradycardia with HR 30 ?Delirium precautions  ?Leukocytosis - WBC's -  17.7->14.8->16.5 (temp - 101.1->98.6->100.4->100->99.8) Currently not on abxs.  ?Portable CXR and UA - as above - Zosyn started 06/09/21 ?NG placed 4/4 ?Hypokalemia - potassium - 3.3->3.5 corrected ?Hypermagnesemia - Magnesium - 2.9 ?Hypernatremia  ?Na - 158 -> 156 -> 151 (Na Q 6 hrs) ?FW 200q4  ?D5W given for 24 hours on 4/6 ?Hypophosphatemia - Phosphorus - 2.2->3.6 corrected ?AKI ?creatinine - 1.44->1.5->1.27- > 1.19 ? ? ?Hospital day # 9 ? ? ?We will sign off the patient is neurologically stable.  Reconsult as needed. ? ? ?

## 2021-06-13 NOTE — Progress Notes (Signed)
?PROGRESS NOTE ? ? ? ?Louis Moore  [id number] DOB: [birth year] DOA: 06/04/2021 ?PCP: Pcp, No  ? ? ?Brief Narrative:  ?Patient is a 71 years old male with no significant medical history but no follow-up with PCP presented to hospital with numbness in his fingers on the right and had a slipper from his bed but did not fall.  His brother called EMS and patient was brought to the hospital for code stroke.  Patient had mild aphasia right upper extremity plegia and right lower extremity weakness and visual neglect.  CT head scan showed acute intraparenchymal hemorrhage around the posterior left frontal lobe with some subarachnoid hemorrhage and intraventricular extension.  There was some mild 3 mm midline shift.  Initial blood pressure was elevated in the 190s.  Patient was then initially admitted by the neurology team in the hospital in the ICU setting.  Patient was subsequently considered stable for transfer out of the ICU. ? ?Assessment and plan. ?Principal Problem: ?  ICH (intracerebral hemorrhage) ([place]) ?Active Problems: ?  Protein-calorie malnutrition, severe ?  Paroxysmal atrial fibrillation ([facility]) ?  Hypernatremia ?  Aspiration pneumonia ([place]) ?  ?Left high frontal parenchymal intracranial hemorrhage likely from uncontrolled hypertension with intraventricular extension and subarachnoid hemorrhage. ? ?Neurology on board.  Patient was not on any anticoagulation prior to this admission.  CT scan repeated showed a stable ICH with no hydrocephalus.  2D echocardiogram with LV ejection fraction of 60 to 65%.  LDL at 118 hemoglobin A1c of 5.6.  Patient has been started on a Lovenox for DVT prophylaxis.  Physical therapy has seen the patient and recommended CIR at this time. ? ?Fever with mild leukocytosis.  Chest x-ray with bibasilar airspace opacities and mild pleural effusion.  Temperature max of 98.9 ?F.  Patient has been started on IV Zosyn per concerns of possible aspiration pneumonia.  Leukocytosis trending down  at 13.1 from 14.8 <16.4. ? ?Hypernatremia.  Improved at this time.  Received IV fluids. ? ?New-onset atrial fibrillation with RVR.   ?Cardiology on board. Had episodes of atrial flutter.  On oral Cardizem and p.o. amiodarone.   ? ?Essential hypertension.  Elevated blood pressure on presentation.  Not on medications at home.  On as needed labetalol and hydralazine.  Systolic blood pressure goal less than 160.  Currently on oral Cardizem. ? ?Hyperlipidemia.  LDL of 118.  Continue Crestor. ? ?Acute kidney injury.  Resolved.  Received IV fluids and water flushes.   ? ?Deconditioning, debility ?Will need rehabilitation. ? ?Agitation, delirium. ?On restraints and Haldol.   ? ?Nutrition.  On tube feeding.  Cortrak tube tube placement if no improvement in his mental status after the weekend. ?  ? ?  DVT prophylaxis: enoxaparin (LOVENOX) injection 40 mg Start: 06/08/21 1200 ?SCD's Start: 06/04/21 0052 ? ?Code Status:   ?  Code Status: Full Code ? ?Disposition: CIR as per PT recommendation. ? ?Status is: Inpatient ? ?Remains inpatient appropriate because: Intracranial hemorrhage, need for rehabilitation, ? ? Family Communication: ?I spoke with the patient's brother and sister on the phone on 06/11/2021 ? ?Consultants:  ?Neurology ?Cardiology ? ?Procedures:  ?NG tube placement and removal ?Cortrak tube tube placement ? ?Antimicrobials:  ?Zosyn IV 4/5> ? ?Anti-infectives (From admission, onward)  ? ? Start     Dose/Rate Route Frequency Ordered Stop  ? 06/09/21 1400  piperacillin-tazobactam (ZOSYN) IVPB 3.375 g       ? 3.375 g ?12.5 mL/hr over 240 Minutes Intravenous Every 8 hours 06/09/21 1324    ? ?  ? ?  Subjective: ?Today, patient was seen and examined at bedside.  Patient denies any pain.  Mildly communicative. ? ?Objective: ?Vitals:  ? 06/13/21 0400 06/13/21 0752 06/13/21 1038 06/13/21 1131  ?BP:  (!) 161/76 (!) 141/70 (!) 144/69  ?Pulse:  75 68 71  ?Resp:  16  16  ?Temp:  98.2 ?F (36.8 ?C)  98.6 ?F (37 ?C)  ?TempSrc:  Oral   Oral  ?SpO2:  97%  98%  ?Weight: 64.2 kg     ? ? ?Intake/Output Summary (Last 24 hours) at 06/13/2021 1144 ?Last data filed at 06/13/2021 0600 ?Gross per 24 hour  ?Intake 5518.72 ml  ?Output 2 ml  ?Net 5516.72 ml  ? ?Filed Weights  ? 06/11/21 0500 06/12/21 0500 06/13/21 0400  ?Weight: 65 kg 64.3 kg 64.2 kg  ? ? ?Physical Examination: ? ?General:  Average built, not in obvious distress, mildly alert awake, ?HENT:   No scleral pallor or icterus noted. Oral mucosa is moist.  ?Chest:    Diminished breath sounds bilaterally.  Coarse breath sounds noted. ?CVS: S1 &S2 heard. No murmur.  Regular rate and rhythm. ?Abdomen: Soft, nontender, nondistended.  Bowel sounds are heard.   ?Extremities: No cyanosis, clubbing or edema.  Peripheral pulses are palpable. ?Psych: Alert, awake, was able to move the extremity on command, mildly communicative ?CNS: Right-sided hemiplegia, increased tonicity on the left side with ?Skin: Warm and dry.  No rashes noted. ? ?Data Reviewed:  ? ?CBC: ?Recent Labs  ?Lab 06/08/21 ?[id number] 06/09/21 ?[id number] 06/11/21 ?[id number] 06/12/21 ?[id number] 06/13/21 ?[id number]  ?WBC 14.8* 16.5* 16.4* 14.8* 13.1*  ?HGB 13.8 13.9 12.0* 11.7* 12.0*  ?HCT 41.2 41.3 37.5* 36.0* 36.3*  ?MCV 92.0 93.0 94.0 94.0 92.1  ?PLT 318 318 195 208 229  ? ? ?Basic Metabolic Panel: ?Recent Labs  ?Lab 06/07/21 ?1001 06/07/21 ?1540 06/07/21 ?2121 06/08/21 ?[id number] 06/08/21 ?[id number] 06/08/21 ?1606 06/08/21 ?2220 06/09/21 ?0409 06/09/21 ?1001 06/10/21 ?0932 06/10/21 ?1437 06/11/21 ?[id number] 06/11/21 ?1711 06/12/21 ?[id number] 06/13/21 ?[id number]  ?NA 156* 157*   < > 159*   < >  --    < > 158*   < > 156* 151* 147* 146*  --  141  ?K  --   --   --  3.3*  --   --   --  3.5  --   --  3.5 4.3 3.9  --  4.0  ?CL  --   --   --  129*  --   --   --  129*  --   --  123* 119* 118*  --  113*  ?CO2  --   --   --  20*  --   --   --  22  --   --  24 23 24   --  23  ?GLUCOSE  --   --   --  113*  --   --   --  215*  --   --  170* 208* 190*  --  167*  ?BUN  --   --   --  35*  --   --   --  40*  --    --  36* 35* 31*  --  28*  ?CREATININE  --   --   --  1.44*  --   --   --  1.51*  --   --  1.27* 1.19 1.00  --  0.94  ?CALCIUM  --   --   --  9.1  --   --   --  8.9  --   --  8.4* 8.1* 8.3*  --  8.2*  ?MG 2.7* 2.7*  --  2.9*  --  2.9*  --   --   --   --   --  2.3  --  2.2 2.4  ?PHOS 2.1* 2.2*  --  2.2*  --  3.6  --   --   --   --   --  3.0  --   --   --   ? < > = values in this interval not displayed.  ? ? ?Liver Function Tests: ?No results for input(s): AST, ALT, ALKPHOS, BILITOT, PROT, ALBUMIN in the last 168 hours. ? ? ? ?Radiology Studies: ?No results found. ? ? ? LOS: 9 days  ? ? ?Flora Lipps, MD ?[facility] ?06/13/2021, 11:44 AM  ? ? ?

## 2021-06-14 DIAGNOSIS — E870 Hyperosmolality and hypernatremia: Secondary | ICD-10-CM | POA: Diagnosis not present

## 2021-06-14 DIAGNOSIS — J690 Pneumonitis due to inhalation of food and vomit: Secondary | ICD-10-CM | POA: Diagnosis not present

## 2021-06-14 DIAGNOSIS — I610 Nontraumatic intracerebral hemorrhage in hemisphere, subcortical: Secondary | ICD-10-CM | POA: Diagnosis not present

## 2021-06-14 DIAGNOSIS — I483 Typical atrial flutter: Secondary | ICD-10-CM | POA: Diagnosis not present

## 2021-06-14 LAB — BASIC METABOLIC PANEL
Anion gap: 7 (ref 5–15)
BUN: 27 mg/dL — ABNORMAL HIGH (ref 8–23)
CO2: 21 mmol/L — ABNORMAL LOW (ref 22–32)
Calcium: 8.1 mg/dL — ABNORMAL LOW (ref 8.9–10.3)
Chloride: 110 mmol/L (ref 98–111)
Creatinine, Ser: 0.97 mg/dL (ref 0.61–1.24)
GFR, Estimated: >60 mL/min (ref >60)
Glucose, Bld: 177 mg/dL — ABNORMAL HIGH (ref 70–99)
Potassium: 4.2 mmol/L (ref 3.5–5.1)
Sodium: 138 mmol/L (ref 135–145)

## 2021-06-14 LAB — CBC
HCT: 36 % — ABNORMAL LOW (ref 39.0–52.0)
Hemoglobin: 11.7 g/dL — ABNORMAL LOW (ref 13.0–17.0)
MCH: 30.2 pg (ref 26.0–34.0)
MCHC: 32.5 g/dL (ref 30.0–36.0)
MCV: 93 fL (ref 80.0–100.0)
Platelets: 258 10*3/uL (ref 150–400)
RBC: 3.87 MIL/uL — ABNORMAL LOW (ref 4.22–5.81)
RDW: 13.2 % (ref 11.5–15.5)
WBC: 13.2 10*3/uL — ABNORMAL HIGH (ref 4.0–10.5)
nRBC: 0 % (ref 0.0–0.2)

## 2021-06-14 LAB — GLUCOSE, CAPILLARY
Glucose-Capillary: 166 mg/dL — ABNORMAL HIGH (ref 70–99)
Glucose-Capillary: 174 mg/dL — ABNORMAL HIGH (ref 70–99)
Glucose-Capillary: 202 mg/dL — ABNORMAL HIGH (ref 70–99)
Glucose-Capillary: 216 mg/dL — ABNORMAL HIGH (ref 70–99)
Glucose-Capillary: 210 mg/dL — ABNORMAL HIGH (ref 70–99)
Glucose-Capillary: 145 mg/dL — ABNORMAL HIGH (ref 70–99)

## 2021-06-14 LAB — MAGNESIUM: Magnesium: 2.5 mg/dL — ABNORMAL HIGH (ref 1.7–2.4)

## 2021-06-14 MED ORDER — AMIODARONE HCL 200 MG PO TABS: 200.0000 mg | ORAL_TABLET | Freq: Every day | ORAL | Status: DC

## 2021-06-14 MED ORDER — AMIODARONE HCL 200 MG PO TABS
400.0000 mg | ORAL_TABLET | Freq: Two times a day (BID) | ORAL | Status: DC
  Administered 2021-06-14 – 2021-06-18 (×8): 400 mg
  Filled 2021-06-14 (×8): qty 2

## 2021-06-14 MED ORDER — BETHANECHOL CHLORIDE 10 MG PO TABS
10.0000 mg | ORAL_TABLET | Freq: Three times a day (TID) | ORAL | Status: DC
  Administered 2021-06-14 – 2021-06-18 (×11): 10 mg
  Filled 2021-06-14 (×11): qty 1

## 2021-06-14 NOTE — Progress Notes (Addendum)
Overnight progress note ? ?Notified by RN that patient has extensive protrusion to left groin above the penis, tender to palpation and guarding.  RN not sure if this is a new finding.  Bladder scanned and showing 221 cc and patient is voiding on his own.  Vital signs stable. ? ?-?Inguinal hernia, stat CT abdomen pelvis ordered ? ?Addendum/ update:  ?Ct abdomen pelvis showing a large left inguinal hernia containing fat and a large segment of proximal to mid sigmoid colon.  Please read full CT report for additional findings.  I spoke to radiologist and was notified that the sigmoid colon appears normal with no free air or fluid.  No signs of inflammation or strangulated hernia. ? ?Spoke to RN again, patient is not complaining of any abdominal pain at this time and vital signs stable.  No nausea or vomiting.  Having regular bowel movements. ? ?-Consult general surgery in the morning. ? ?Addendum 06/15/2021 at 1:58 AM: Noninflamed diverticula are seen throughout the sigmoid colon on CT. ? ?

## 2021-06-14 NOTE — Progress Notes (Signed)
?PROGRESS NOTE ? ? ? ?Louis Moore  [id number] DOB: 02-27-1951 DOA: 06/04/2021 ?PCP: Pcp, No  ? ? ?Brief Narrative:  ?Patient is a 71 years old male with no significant medical history but no follow-up with PCP presented to hospital with numbness in his fingers on the right and had a slipper from his bed but did not fall.  His brother called EMS and patient was brought to the hospital for code stroke.  Patient had mild aphasia right upper extremity plegia and right lower extremity weakness and visual neglect.  CT head scan showed acute intraparenchymal hemorrhage around the posterior left frontal lobe with some subarachnoid hemorrhage and intraventricular extension.  There was some mild 3 mm midline shift.  Initial blood pressure was elevated in the 190s.  Patient was then initially admitted by the neurology team in the hospital in the ICU setting.  Patient was subsequently considered stable for transfer out of the ICU. ? ?Assessment and plan. ?Principal Problem: ?  ICH (intracerebral hemorrhage) ([place]) ?Active Problems: ?  Protein-calorie malnutrition, severe ?  Paroxysmal atrial fibrillation (HCC) ?  Hypernatremia ?  Aspiration pneumonia ([place]) ?  ?Left high frontal parenchymal intracranial hemorrhage likely from uncontrolled hypertension with intraventricular extension and subarachnoid hemorrhage. ? ?Neurology on board.  Patient was not on any anticoagulation prior to this admission.  CT scan repeated showed a stable ICH with no hydrocephalus.  2D echocardiogram with LV ejection fraction of 60 to 65%.  LDL at 118 hemoglobin A1c of 5.6.  on  Lovenox for DVT prophylaxis.  Physical therapy has seen the patient and recommended CIR at this time. ? ?Fever with mild leukocytosis.  Chest x-ray with bibasilar airspace opacities and mild pleural effusion.  Temperature max of 98.9 ?F.  Patient has been started on IV Zosyn per concerns of possible aspiration pneumonia.  Leukocytosis trending down at 13.1 from 14.8  <16.4. ? ?Hypernatremia.  Improved at this time.  Received IV fluids. ? ?New-onset atrial fibrillation with RVR.   ?Cardiology on board. Had episodes of atrial flutter.  On oral Cardizem and p.o. amiodarone.  Currently controlled. ? ?Essential hypertension.   ?Elevated blood pressure on presentation.  Not on medications at home.  On as needed labetalol and hydralazine.  Systolic blood pressure goal less than 160.  Currently on oral Cardizem. ? ?Hyperlipidemia.  LDL of 118.  Continue Crestor. ? ?Acute kidney injury.  Resolved.   ? ?Deconditioning, debility ?Will need rehabilitation. ? ?Agitation, delirium. ?On restraints and Haldol.   ? ?Nutrition.  On Cortrak tube placement. Spoke with the patient's sister Hassan Rowan on the phone and she will talk with the rest of the family and make decision of PEG tube after discussion. ?  ? ?  DVT prophylaxis: enoxaparin (LOVENOX) injection 40 mg Start: 06/08/21 1200 ?SCD's Start: 06/04/21 0052 ? ?Code Status:   ?  Code Status: Full Code ? ?Disposition: CIR as per PT recommendation. ? ?Status is: Inpatient ? ?Remains inpatient appropriate because: Intracranial hemorrhage, need for rehabilitation, ? ? Family Communication: ?I spoke with the patient's brother and sister on the phone on 06/11/2021 I again spoke with the patient's sister Ms. Hassan Rowan on the phone on 06/14/2021 and discussed about potential PEG tube placement. ? ?Consultants:  ?Neurology ?Cardiology ? ?Procedures:  ?NG tube placement and removal ?Cortrak tube tube placement ? ?Antimicrobials:  ?Zosyn IV 4/5> ? ?Anti-infectives (From admission, onward)  ? ? Start     Dose/Rate Route Frequency Ordered Stop  ? 06/09/21 1400  piperacillin-tazobactam (ZOSYN) IVPB  3.375 g       ? 3.375 g ?12.5 mL/hr over 240 Minutes Intravenous Every 8 hours 06/09/21 1324    ? ?  ? ?Subjective: ?Today, patient was seen and examined at bedside.  Speech therapy at bedside.  No improvement in his swallowing efforts.  Is intermittently lethargic.  No  significant neurological improvement noted.  No other interval complaints reported. ? ?Objective: ?Vitals:  ? 06/14/21 0100 06/14/21 0443 06/14/21 0806 06/14/21 0824  ?BP: (!) 171/80  (!) 142/61   ?Pulse: 60  62   ?Resp: 19  16 20   ?Temp: 98.7 ?F (37.1 ?C)  99.1 ?F (37.3 ?C)   ?TempSrc: Oral  Oral   ?SpO2: 98%  99%   ?Weight:  65.2 kg    ? ? ?Intake/Output Summary (Last 24 hours) at 06/14/2021 0950 ?Last data filed at 06/14/2021 0807 ?Gross per 24 hour  ?Intake 1500.5 ml  ?Output 2150 ml  ?Net -649.5 ml  ? ?Filed Weights  ? 06/12/21 0500 06/13/21 0400 06/14/21 0443  ?Weight: 64.3 kg 64.2 kg 65.2 kg  ? ? ?Physical Examination: ? ?General:  Average built, not in obvious distress, cortrak tube tube in place, mildly sleepy, mumbling speech ?HENT:   No scleral pallor or icterus noted. Oral mucosa is moist.  ?Chest:  .  Diminished breath sounds bilaterally.  ?CVS: S1 &S2 heard. No murmur.  Regular rate and rhythm. ?Abdomen: Soft, nontender, nondistended.  Bowel sounds are heard.   ?Extremities: No cyanosis, clubbing or edema.  Peripheral pulses are palpable. ?Psych: Alert, awake and moves extremities on command, ?CNS: Right hemiplegia, increased tonicity on the left side. ?Skin: Warm and dry.  No rashes noted. ? ?Data Reviewed:  ? ?CBC: ?Recent Labs  ?Lab 06/09/21 ?0409 06/11/21 ?[id number] 06/12/21 ?0156 06/13/21 ?[id number] 06/14/21 ?0430  ?WBC 16.5* 16.4* 14.8* 13.1* 13.2*  ?HGB 13.9 12.0* 11.7* 12.0* 11.7*  ?HCT 41.3 37.5* 36.0* 36.3* 36.0*  ?MCV 93.0 94.0 94.0 92.1 93.0  ?PLT 318 195 208 229 258  ? ? ?Basic Metabolic Panel: ?Recent Labs  ?Lab 06/07/21 ?1001 06/07/21 ?1540 06/07/21 ?2121 06/08/21 ?[id number] 06/08/21 ?[id number] 06/08/21 ?1606 06/08/21 ?2220 06/10/21 ?1437 06/11/21 ?0427 06/11/21 ?1711 06/12/21 ?[id number] 06/13/21 ?[id number] 06/14/21 ?0430  ?NA 156* 157*   < > 159*   < >  --    < > 151* 147* 146*  --  141 138  ?K  --   --   --  3.3*  --   --    < > 3.5 4.3 3.9  --  4.0 4.2  ?CL  --   --   --  129*  --   --    < > 123* 119* 118*  --   113* 110  ?CO2  --   --   --  20*  --   --    < > 24 23 24   --  23 21*  ?GLUCOSE  --   --   --  113*  --   --    < > 170* 208* 190*  --  167* 177*  ?BUN  --   --   --  35*  --   --    < > 36* 35* 31*  --  28* 27*  ?CREATININE  --   --   --  1.44*  --   --    < > 1.27* 1.19 1.00  --  0.94 0.97  ?CALCIUM  --   --   --  9.1  --   --    < >  8.4* 8.1* 8.3*  --  8.2* 8.1*  ?MG 2.7* 2.7*  --  2.9*  --  2.9*  --   --  2.3  --  2.2 2.4 2.5*  ?PHOS 2.1* 2.2*  --  2.2*  --  3.6  --   --  3.0  --   --   --   --   ? < > = values in this interval not displayed.  ? ? ?Liver Function Tests: ?No results for input(s): AST, ALT, ALKPHOS, BILITOT, PROT, ALBUMIN in the last 168 hours. ? ? ? ?Radiology Studies: ?No results found. ? ? ? LOS: 10 days  ? ? ?Flora Lipps, MD ?[facility] ?06/14/2021, 9:50 AM  ? ? ?

## 2021-06-14 NOTE — Progress Notes (Signed)
Extensive Protrusion to left groin , above penis.bladder scanned at 221. Tender to touch : online MD informed of   protrusion to his left groin , above his penis not sure if this is a new finding however pt is guarding / tender to palpate . bladder scan showed 221cc.  ?

## 2021-06-14 NOTE — Progress Notes (Signed)
Physical Therapy Treatment ?Patient Details ?Name: Sigmund Morera ?MRN: [id number] ?DOB: [birth year] ?Today's Date: 06/14/2021 ? ? ?History of Present Illness Patient is a 71 y/o male admitted with R hemiplegia and numbness found to have high L frontal ICH w/ mild cytotoxic edema, trace IV extension and 41mm midline shift. Stated on hypertonic saline, cleviprex.  No PMH as has not seen PCP in decades. ? ?  ?PT Comments  ? ? Pt making slower progress toward goals, limited by lethargic, lack of awareness and inattention to his right.  Emphasis today on ROM, bed mobility during pericare, transitions to EOB, sitting balance, working on trunk control in midline, cervical extention. Finally sit to stands to submaximal upright posture x3 during peri care and bed change. ?  ?Recommendations for follow up therapy are one component of a multi-disciplinary discharge planning process, led by the attending physician.  Recommendations may be updated based on patient status, additional functional criteria and insurance authorization. ? ?Follow Up Recommendations ? Acute inpatient rehab (3hours/day) ?  ?  ?Assistance Recommended at Discharge Frequent or constant Supervision/Assistance  ?Patient can return home with the following Two people to help with walking and/or transfers;Two people to help with bathing/dressing/bathroom;Assistance with cooking/housework;Direct supervision/assist for medications management;Assist for transportation;Help with stairs or ramp for entrance;Direct supervision/assist for financial management;Assistance with feeding ?  ?Equipment Recommendations ? Other (comment) (TBA)  ?  ?Recommendations for Other Services Rehab consult ? ? ?  ?Precautions / Restrictions Precautions ?Precautions: Fall ?Precaution Comments: R neglect, hemiparesis, NG tube  ?  ? ?Mobility ? Bed Mobility ?Overal bed mobility: Needs Assistance ?Bed Mobility: Rolling, Sidelying to Sit, Sit to Supine ?Rolling: Max assist, Total  assist ?Sidelying to sit: Max assist, +2 for safety/equipment ?  ?Sit to supine: Max assist, +2 for physical assistance ?  ?General bed mobility comments: Initially pt so drowsy that he didn't become alert enough to participate until after sitting up. ?  ? ?Transfers ?Overall transfer level: Needs assistance ?Equipment used: 1 person hand held assist ?Transfers: Sit to/from Stand ?Sit to Stand: Max assist, +2 safety/equipment, Total assist ?  ?  ?  ?  ?  ?General transfer comment: x4 during peri care and stripping/remaking the bed, working to get upright, but not quite attaining. ?  ? ?Ambulation/Gait ?  ?  ?  ?  ?  ?  ?  ?  ? ? ?Stairs ?  ?  ?  ?  ?  ? ? ?Wheelchair Mobility ?  ? ?Modified Rankin (Stroke Patients Only) ?Modified Rankin (Stroke Patients Only) ?Modified Rankin: Severe disability ? ? ?  ?Balance Overall balance assessment: Needs assistance ?Sitting-balance support: Feet supported ?Sitting balance-Leahy Scale: Zero ?Sitting balance - Comments: sat EOB >10 min working on trunk control by inhibiting L UE pushing, giving pt visual cues to help give feedback on vertical and worked on head movement into midline extention. ?  ?Standing balance support: Bilateral upper extremity supported ?Standing balance-Leahy Scale: Zero ?Standing balance comment: total/max face to face stand during bed change and pericare. ?  ?  ?  ?  ?  ?  ?  ?  ?  ?  ?  ?  ? ?  ?Cognition Arousal/Alertness: Lethargic, Awake/alert ?Behavior During Therapy: Flat affect ?Overall Cognitive Status: Difficult to assess (NT formally) ?  ?  ?  ?  ?  ?  ?  ?  ?  ?  ?  ?  ?  ?  ?  ?  ?  ?  ?  ? ?  ?  Exercises Other Exercises ?Other Exercises: AAROM of Bil LE's and PROM toR UE ? ?  ?General Comments   ?  ?  ? ?Pertinent Vitals/Pain Pain Assessment ?Pain Assessment: Faces ?Faces Pain Scale: No hurt ?Pain Intervention(s): Monitored during session  ? ? ?Home Living   ?  ?  ?  ?  ?  ?  ?  ?  ?  ?   ?  ?Prior Function    ?  ?  ?   ? ?PT Goals (current  goals can now be found in the care plan section) Acute Rehab PT Goals ?Patient Stated Goal: unable to state ?PT Goal Formulation: With family ?Time For Goal Achievement: 06/20/21 ?Potential to Achieve Goals: Fair ?Progress towards PT goals: Progressing toward goals ? ?  ?Frequency ? ? ? Min 4X/week ? ? ? ?  ?PT Plan Current plan remains appropriate  ? ? ?Co-evaluation   ?  ?  ?  ?  ? ?  ?AM-PAC PT "6 Clicks" Mobility   ?Outcome Measure ? Help needed turning from your back to your side while in a flat bed without using bedrails?: Total ?Help needed moving from lying on your back to sitting on the side of a flat bed without using bedrails?: Total ?Help needed moving to and from a bed to a chair (including a wheelchair)?: Total ?Help needed standing up from a chair using your arms (e.g., wheelchair or bedside chair)?: Total ?Help needed to walk in hospital room?: Total ?Help needed climbing 3-5 steps with a railing? : Total ?6 Click Score: 6 ? ?  ?End of Session   ?Activity Tolerance: Patient tolerated treatment well;Patient limited by fatigue;Patient limited by lethargy ?Patient left: in bed;with call bell/phone within reach;with bed alarm set ?Nurse Communication: Mobility status ?PT Visit Diagnosis: Other abnormalities of gait and mobility (R26.89);Hemiplegia and hemiparesis;Other symptoms and signs involving the nervous system (R29.898) ?Hemiplegia - Right/Left: Right ?Hemiplegia - dominant/non-dominant: Dominant ?Hemiplegia - caused by: Nontraumatic intracerebral hemorrhage ?  ? ? ?Time: [id number] ?PT Time Calculation (min) (ACUTE ONLY): 57 min ? ?Charges:  $Therapeutic Activity: 23-37 mins ?$Neuromuscular Re-education: 8-22 mins ?$Self Care/Home Management: 8-22          ?          ? ?06/14/2021 ? ?Jacinto Halim., PT ?Acute Rehabilitation Services ?[phone]  (pager) ?[phone]  (office) ? ? ?Eliseo Gum Miosha Behe ?06/14/2021, 5:50 PM ? ?

## 2021-06-14 NOTE — Progress Notes (Signed)
STROKE TEAM PROGRESS NOTE  ? ?INTERVAL HISTORY ?Patient is lying in bed.  He remains aphasic but can be aroused and follows simple midline and  left body commands.  Blood pressure adequately controlled.  Neurological exam is unchanged .he continues to have dysphagia and speech therapy still recommends n.p.o. ? ? ? ?Vitals:  ? 06/14/21 0443 06/14/21 0806 06/14/21 0824 06/14/21 1136  ?BP:  (!) 142/61  (!) 154/74  ?Pulse:  62  71  ?Resp:  16 20 17   ?Temp:  99.1 ?F (37.3 ?C)  98.1 ?F (36.7 ?C)  ?TempSrc:  Oral  Oral  ?SpO2:  99%  97%  ?Weight: 65.2 kg     ? ?CBC:  ?Recent Labs  ?Lab 06/13/21 ?0609 06/14/21 ?0430  ?WBC 13.1* 13.2*  ?HGB 12.0* 11.7*  ?HCT 36.3* 36.0*  ?MCV 92.1 93.0  ?PLT 229 258  ? ?Basic Metabolic Panel:  ?Recent Labs  ?Lab 06/08/21 ?1606 06/08/21 ?2220 06/11/21 ?0427 06/11/21 ?1711 06/13/21 ?[id number] 06/14/21 ?0430  ?NA  --    < > 147*   < > 141 138  ?K  --    < > 4.3   < > 4.0 4.2  ?CL  --    < > 119*   < > 113* 110  ?CO2  --    < > 23   < > 23 21*  ?GLUCOSE  --    < > 208*   < > 167* 177*  ?BUN  --    < > 35*   < > 28* 27*  ?CREATININE  --    < > 1.19   < > 0.94 0.97  ?CALCIUM  --    < > 8.1*   < > 8.2* 8.1*  ?MG 2.9*  --  2.3   < > 2.4 2.5*  ?PHOS 3.6  --  3.0  --   --   --   ? < > = values in this interval not displayed.  ? ?Lipid Panel:  ?No results for input(s): CHOL, TRIG, HDL, CHOLHDL, VLDL, LDLCALC in the last 168 hours. ? ?HgbA1c:  ?No results for input(s): HGBA1C in the last 168 hours. ? ?Urine Drug Screen:  ?No results for input(s): LABOPIA, COCAINSCRNUR, LABBENZ, AMPHETMU, THCU, LABBARB in the last 168 hours. ? ?Alcohol Level No results for input(s): ETH in the last 168 hours. ? ?IMAGING past 24 hours ?No results found. ? ?PHYSICAL EXAM ?Constitutional: Elderly white male who is not in distress but mildly agitated this morning ?Eyes: No scleral injection ?Respiratory: Effort normal, non-labored breathing ?Gait and Station: Deferred ? ?Neuro: ?PERRL, drowsy but can be easily aroused.  Will  intermittently follow only a few midline and left body commands, expressive aphasia although he did say simple words today that he was doing fine.  Will move LUE and BLE spontaneously, no movement of RUE.  Tongue midline ? ?ASSESSMENT/PLAN ?[name] is a 71 y.o. male with history of not going to PCP in decades presented (3/31) for right sided hemiplegia and numbness after 1 day of hand tingling. Admitted for left high frontal ICH. 12hrs MRI showed ICH stable without hydrocephalus or herniation. CT head on 4/1 shows stable ICH. Patient in SVT vs afib w/ RVR, cardiology consulted on 4/3. NG placed 4/4, Cardizem transitioned to PO.  ?  ? ?Stroke: Left high frontal parenchymal ICH presumably from uncontrolled hypertension with mild cytotoxic edema and trace intraventricular extension but no hydrocephalus ?code Stroke CT head:  Acute intraparenchymal hemorrhage centered at  the high posterior left frontal lobe measures 4.0 x 3.2 x 4.5 cm. Surrounding low-density vasogenic edema. Associated small volume subarachnoid hemorrhage within the adjacent left frontal region. Intraventricular extension with blood seen within the left greater than right lateral ventricles. ICH volume ~ 29 mL ?CT head at 3hrs and 8hrs after: Stable ICH ?MRI: Stable ICH, no hydrocephalus, no herniations ?MRA: No large vessel stenosis or occlusion.  No aneurysms.   ?2D Echo: Left ventricular ejection fraction 60-65%.   ?Portable CXR 1 view - 06/09/21 - Right basilar airspace opacities and small right pleural effusion, could be atelectasis or pneumonia. ?U/A - 06/09/21 - not C/W UTI ?LDL 118  (06/04/2021) ?HgbA1c 5.6 (06/04/2021)  ?VTE prophylaxis - Enoxaparin (lovenox) injection 40 mg start: 06/08/21 1200 ?Scd's start: 06/04/21 0052, Sequential compression devices, below knee,  ?Diet: Dysph 3 ?No antithrombotic prior to admission, no antithrombotic now secondary to [place] ?Therapy recommendations: CIR ?Disposition: Acute inpatient rehab (3hours/day)  Pending ? ?New Onset Atrial fibrillation ?EKG shows afib w/ RVR ?Cardizem gtt with one time IVP ordered for rate control ?Cardiology consulted 06/07/21  ?Rate currently controlled ?Infusion transitioned to PO Cardizem on 4/4. PRN medication for rate control ordered.  ?120mg  q6hrs ? ?Hypertension ?Home meds: N/A  ?Long-term BP goal normotensive ?PRN labetalol, hydralazine  ?SBP goal <160  ? ?Hyperlipidemia ?Home meds:  N/A ?LDL 118  (06/04/2021), goal < 70  ?Will add statin at discharge ? ?Other Stroke Risk Factors ?Advanced Age >/= 1  ?Family hx stroke - family history is not on file.  ?Has not seen PCP in decades ?UDS - negative ? ?Other Active Problems ?SVT vs afib?  ?Patient had an episode of SVT with rate to the 180s on 4/1.  This resolved with labetalol administration ?Agitation ?Clonidine, seroquel, haldol  ?Precedex resulted in bradycardia with HR 30 ?Delirium precautions  ?Leukocytosis - WBC's -  17.7->14.8->16.5 (temp - 101.1->98.6->100.4->100->99.8) Currently not on abxs.  ?Portable CXR and UA - as above - Zosyn started 06/09/21 ?NG placed 4/4 ?Hypokalemia - potassium - 3.3->3.5 corrected ?Hypermagnesemia - Magnesium - 2.9 ?Hypernatremia  ?Na - 158 -> 156 -> 151 (Na Q 6 hrs) ?FW 200q4  ?D5W given for 24 hours on 4/6 ?Hypophosphatemia - Phosphorus - 2.2->3.6 corrected ?AKI ?creatinine - 1.44->1.5->1.27- > 1.19 ? ? ?Hospital day # 9 ? ?Patient continues to have significant dysphagia.  Recommend PEG tube placement prior to transfer to skilled nursing facility.  Continue ongoing medical management.  Stroke team will sign off.  Kindly call for questions.  Discussed with Dr. Talmage Nap. ?Antony Contras MD ? ?

## 2021-06-14 NOTE — Progress Notes (Signed)
Speech Language Pathology Treatment: Dysphagia  ?Patient Details ?Name: Louis Moore ?MRN: [id number] ?DOB: December 20, 1950 ?Today's Date: 06/14/2021 ?Time: [id number] ?SLP Time Calculation (min) (ACUTE ONLY): 9 min ? ?Assessment / Plan / Recommendation ?Clinical Impression ? Pt was seen for dysphagia treatment. His level of alertness was slightly improved compared to when he was last seen by this SLP. He opened his eyes and vocalized with verbal and tactile stimulation, but he exhibited difficulty maintaining alertness. Oral care was provided. Ice chips and puree boluses were presented, and pt demonstrated lingual movement due to verbalization of unintelligible speech, but no intentional bolus manipulation or swallowing was noted despite verbal prompts and tactile cues. Pt's current largest barrier to initiation of a p.o. diet and aphasia intervention is his lethargy. It is recommended that his NPO status be maintained at this time, and SLP will continue to follow pt.  ?  ?HPI HPI: 71 y.o. male with no significant PMH but has not seen a PCP in decades admitted with aphasia and right sided weakness. Dx acute Intraparenchymal hemorrhage in the high L posterior frontal lobe with associated cerebral edema, mass effect, midline shift and brain compression. Cortrak placement unsuccessful 4/3. ?  ?   ?SLP Plan ? Continue with current plan of care ? ?  ?  ?Recommendations for follow up therapy are one component of a multi-disciplinary discharge planning process, led by the attending physician.  Recommendations may be updated based on patient status, additional functional criteria and insurance authorization. ?  ? ?Recommendations  ?Diet recommendations: NPO ?Medication Administration: Via alternative means  ?   ?    ?   ? ? ? ? Oral Care Recommendations: Oral care QID ?Follow Up Recommendations: Acute inpatient rehab (3hours/day) ?Assistance recommended at discharge: Frequent or constant Supervision/Assistance ?SLP Visit  Diagnosis: Dysphagia, oropharyngeal phase (R13.12);Aphasia (R47.01) ?Plan: Continue with current plan of care ? ? ? ? ?  ?  ?Louis Moore, [place], CCC-SLP ?Acute Rehabilitation Services ?Office number [phone] ?Pager [phone] ? ? ?Louis Moore ? ?06/14/2021, 9:08 AM ? ? ? ?

## 2021-06-14 NOTE — Progress Notes (Signed)
HOSPITAL MEDICINE OVERNIGHT NOTE ? ?Nursing reports that patient continues to attempt to pull at medical hardware with the left upper extremity.  Patient is not redirectable and there is no sitter available. ? ?We will renew left wrist restraint order. ?  ?Vernelle Emerald  MD ?[facility]  ?

## 2021-06-15 ENCOUNTER — Encounter (HOSPITAL_COMMUNITY): Payer: Self-pay | Admitting: Neurology

## 2021-06-15 ENCOUNTER — Inpatient Hospital Stay (HOSPITAL_COMMUNITY): Payer: Medicare Other

## 2021-06-15 DIAGNOSIS — I483 Typical atrial flutter: Secondary | ICD-10-CM | POA: Diagnosis not present

## 2021-06-15 DIAGNOSIS — J690 Pneumonitis due to inhalation of food and vomit: Secondary | ICD-10-CM | POA: Diagnosis not present

## 2021-06-15 DIAGNOSIS — I610 Nontraumatic intracerebral hemorrhage in hemisphere, subcortical: Secondary | ICD-10-CM | POA: Diagnosis not present

## 2021-06-15 DIAGNOSIS — E870 Hyperosmolality and hypernatremia: Secondary | ICD-10-CM | POA: Diagnosis not present

## 2021-06-15 LAB — GLUCOSE, CAPILLARY
Glucose-Capillary: 108 mg/dL — ABNORMAL HIGH (ref 70–99)
Glucose-Capillary: 148 mg/dL — ABNORMAL HIGH (ref 70–99)
Glucose-Capillary: 149 mg/dL — ABNORMAL HIGH (ref 70–99)
Glucose-Capillary: 154 mg/dL — ABNORMAL HIGH (ref 70–99)
Glucose-Capillary: 172 mg/dL — ABNORMAL HIGH (ref 70–99)
Glucose-Capillary: 183 mg/dL — ABNORMAL HIGH (ref 70–99)
Glucose-Capillary: 184 mg/dL — ABNORMAL HIGH (ref 70–99)

## 2021-06-15 NOTE — Consult Note (Signed)
? ? ? ?Louis Moore ?December 12, 1950  ?[id number].   ? ?Requesting MD: Dr. Rebekah Chesterfield Pokhrel ?Chief Complaint/Reason for Consult: dysphagia  ? ?HPI:  ?This is a 71 yo male who presented to the ED with numbness of his fingers on the right almost fell getting out of bed.  He was brought in as a code CVA and found to have an acute intraparenchymal hemorrhage around the posterior left frontal lobe with some SAH and intraventricular extension.  He has since been found to have aphasia, dysphagia, a fib with rvr,  and HTN.  Unfortunately he has continued to have dysphagia and recommendation for NPO per speech.  IR was consulted, but due to anatomy he is not a candidate for IR g-tube placement.  General surgery has been consulted for open g-tube placement. ? ?ROS: ?Review of Systems  ?Unable to perform ROS: Other : Unable to obtain secondary to aphasia and some delirium. ? ?History reviewed. No pertinent family history. ? ?Past Medical History:  ?Diagnosis Date  ? Medical history non-contributory   ? ? ?Past Surgical History:  ?Procedure Laterality Date  ? NO PAST SURGERIES    ? ? ?Social History:  reports that he has never smoked. He has never used smokeless tobacco. He reports that he does not drink alcohol and does not use drugs. ? ?Allergies: No Known Allergies ? ?No medications prior to admission.  ? ? ? ?Physical Exam: ?Blood pressure (!) 154/52, pulse (!) 55, temperature (!) 97.4 ?F (36.3 ?C), temperature source Axillary, resp. rate 16, weight 60.3 kg, SpO2 97 %. ?General: WD, WN male who is laying in bed in NAD ?HEENT: head is normocephalic, atraumatic.  Sclera are noninjected.  PERRL.  Ears and nose without any masses or lesions.  Mouth is pink and moist  ?Heart: Reg Rate.  ?Lungs: CTAB, no wheezes, rhonchi, or rales noted.  Respiratory effort nonlabored ?Abd: Soft, NT, ND, +BS, no masses or organomegaly, left inguinal hernia noted and appears to be fully reducible. No prior abdominal scars.  ?MS: No LE edema. ?Skin: warm  and dry with no masses, lesions, or rashes ?Neuro: Moves LUE and BLE spontaneously. No movement RUE.  ?Psych: Alert. Orientated to self.  ? ? ?Results for orders placed or performed during the hospital encounter of 06/04/21 (from the past 48 hour(s))  ?Glucose, capillary     Status: Abnormal  ? Collection Time: 06/13/21 11:29 AM  ?Result Value Ref Range  ? Glucose-Capillary 143 (H) 70 - 99 mg/dL  ?  Comment: Glucose reference range applies only to samples taken after fasting for at least 8 hours.  ?Glucose, capillary     Status: Abnormal  ? Collection Time: 06/13/21  5:25 PM  ?Result Value Ref Range  ? Glucose-Capillary 149 (H) 70 - 99 mg/dL  ?  Comment: Glucose reference range applies only to samples taken after fasting for at least 8 hours.  ?Glucose, capillary     Status: Abnormal  ? Collection Time: 06/13/21  8:17 PM  ?Result Value Ref Range  ? Glucose-Capillary 198 (H) 70 - 99 mg/dL  ?  Comment: Glucose reference range applies only to samples taken after fasting for at least 8 hours.  ?Glucose, capillary     Status: Abnormal  ? Collection Time: 06/14/21  1:34 AM  ?Result Value Ref Range  ? Glucose-Capillary 166 (H) 70 - 99 mg/dL  ?  Comment: Glucose reference range applies only to samples taken after fasting for at least 8 hours.  ?Basic metabolic panel  Status: Abnormal  ? Collection Time: 06/14/21  4:30 AM  ?Result Value Ref Range  ? Sodium 138 135 - 145 mmol/L  ? Potassium 4.2 3.5 - 5.1 mmol/L  ? Chloride 110 98 - 111 mmol/L  ? CO2 21 (L) 22 - 32 mmol/L  ? Glucose, Bld 177 (H) 70 - 99 mg/dL  ?  Comment: Glucose reference range applies only to samples taken after fasting for at least 8 hours.  ? BUN 27 (H) 8 - 23 mg/dL  ? Creatinine, Ser 0.97 0.61 - 1.24 mg/dL  ? Calcium 8.1 (L) 8.9 - 10.3 mg/dL  ? GFR, Estimated >60 >60 mL/min  ?  Comment: (NOTE) ?Calculated using the CKD-EPI Creatinine Equation (2021) ?  ? Anion gap 7 5 - 15  ?  Comment: Performed at [facility], [address] [place],  [place] [address]  ?CBC     Status: Abnormal  ? Collection Time: 06/14/21  4:30 AM  ?Result Value Ref Range  ? WBC 13.2 (H) 4.0 - 10.5 K/uL  ? RBC 3.87 (L) 4.22 - 5.81 MIL/uL  ? Hemoglobin 11.7 (L) 13.0 - 17.0 g/dL  ? HCT 36.0 (L) 39.0 - 52.0 %  ? MCV 93.0 80.0 - 100.0 fL  ? MCH 30.2 26.0 - 34.0 pg  ? MCHC 32.5 30.0 - 36.0 g/dL  ? RDW 13.2 11.5 - 15.5 %  ? Platelets 258 150 - 400 K/uL  ? nRBC 0.0 0.0 - 0.2 %  ?  Comment: Performed at [facility], [address] [place], [place] [address]  ?Magnesium     Status: Abnormal  ? Collection Time: 06/14/21  4:30 AM  ?Result Value Ref Range  ? Magnesium 2.5 (H) 1.7 - 2.4 mg/dL  ?  Comment: Performed at [facility], [address] [place], [place] [address]  ?Glucose, capillary     Status: Abnormal  ? Collection Time: 06/14/21  4:42 AM  ?Result Value Ref Range  ? Glucose-Capillary 174 (H) 70 - 99 mg/dL  ?  Comment: Glucose reference range applies only to samples taken after fasting for at least 8 hours.  ?Glucose, capillary     Status: Abnormal  ? Collection Time: 06/14/21  8:14 AM  ?Result Value Ref Range  ? Glucose-Capillary 202 (H) 70 - 99 mg/dL  ?  Comment: Glucose reference range applies only to samples taken after fasting for at least 8 hours.  ? Comment 1 Notify RN   ? Comment 2 Document in Chart   ?Glucose, capillary     Status: Abnormal  ? Collection Time: 06/14/21 11:42 AM  ?Result Value Ref Range  ? Glucose-Capillary 216 (H) 70 - 99 mg/dL  ?  Comment: Glucose reference range applies only to samples taken after fasting for at least 8 hours.  ? Comment 1 Notify RN   ? Comment 2 Document in Chart   ?Glucose, capillary     Status: Abnormal  ? Collection Time: 06/14/21  4:57 PM  ?Result Value Ref Range  ? Glucose-Capillary 210 (H) 70 - 99 mg/dL  ?  Comment: Glucose reference range applies only to samples taken after fasting for at least 8 hours.  ? Comment 1 Notify RN   ? Comment 2 Document in Chart   ?Glucose, capillary     Status: Abnormal  ? Collection Time:  06/14/21  7:56 PM  ?Result Value Ref Range  ? Glucose-Capillary 145 (H) 70 - 99 mg/dL  ?  Comment: Glucose  reference range applies only to samples taken after fasting for at least 8 hours.  ? Comment 1 Notify RN   ? Comment 2 Document in Chart   ?Glucose, capillary     Status: Abnormal  ? Collection Time: 06/15/21 12:35 AM  ?Result Value Ref Range  ? Glucose-Capillary 172 (H) 70 - 99 mg/dL  ?  Comment: Glucose reference range applies only to samples taken after fasting for at least 8 hours.  ?Glucose, capillary     Status: Abnormal  ? Collection Time: 06/15/21  4:08 AM  ?Result Value Ref Range  ? Glucose-Capillary 154 (H) 70 - 99 mg/dL  ?  Comment: Glucose reference range applies only to samples taken after fasting for at least 8 hours.  ? Comment 1 Notify RN   ? Comment 2 Document in Chart   ?Glucose, capillary     Status: Abnormal  ? Collection Time: 06/15/21  8:35 AM  ?Result Value Ref Range  ? Glucose-Capillary 184 (H) 70 - 99 mg/dL  ?  Comment: Glucose reference range applies only to samples taken after fasting for at least 8 hours.  ? Comment 1 Notify RN   ? Comment 2 Document in Chart   ? ?CT ABDOMEN PELVIS WO CONTRAST ? ?Addendum Date: 06/15/2021   ?ADDENDUM REPORT: 06/15/2021 02:58 ADDENDUM: Results were discussed with the ordering physician at 1:45 a.m. [place] on December 15, 2021. Electronically Signed   By: Aram Candelahaddeus  Houston M.D.   On: 06/15/2021 02:58  ? ?Result Date: 06/15/2021 ?CLINICAL DATA:  Evaluate for left groin hernia. EXAM: CT ABDOMEN AND PELVIS WITHOUT CONTRAST TECHNIQUE: Multidetector CT imaging of the abdomen and pelvis was performed following the standard protocol without IV contrast. RADIATION DOSE REDUCTION: This exam was performed according to the departmental dose-optimization program which includes automated exposure control, adjustment of the mA and/or kV according to patient size and/or use of iterative reconstruction technique. COMPARISON:  None. FINDINGS: Lower chest: Mild to  moderate severity atelectasis is seen within the bilateral lung bases. There is a small right pleural effusion. Hepatobiliary: No focal liver abnormality is seen. No gallstones, gallbladder wall thickening,

## 2021-06-15 NOTE — Progress Notes (Signed)
?PROGRESS NOTE ? ? ? ?Louis Moore  [id number] DOB: 1951/01/04 DOA: 06/04/2021 ?PCP: Pcp, No  ? ? ?Brief Narrative:  ?Patient is a 71 years old male with no significant medical history but no follow-up with PCP, presented to hospital with numbness in his fingers on the right and had a slipped from his bed but did not fall.  His brother called EMS and patient was brought to the hospital for code stroke.  Patient had mild aphasia right upper extremity plegia and right lower extremity weakness and visual neglect.  CT head scan showed acute intraparenchymal hemorrhage around the posterior left frontal lobe with some subarachnoid hemorrhage and intraventricular extension.  There was some mild 3 mm midline shift.  Initial blood pressure was elevated in the 190s.  Patient was then initially admitted by the neurology team in the hospital in the ICU setting.  Patient was subsequently considered stable for transfer out of the ICU after stabilization. ? ?Assessment and plan. ?Principal Problem: ?  ICH (intracerebral hemorrhage) ([place]) ?Active Problems: ?  Protein-calorie malnutrition, severe ?  Paroxysmal atrial fibrillation (H[facility]) ?  Hypernatremia ?  Aspiration pneumonia ([place]) ?  ?Left high frontal parenchymal intracranial hemorrhage likely from uncontrolled hypertension with intraventricular extension and subarachnoid hemorrhage. ? ?Neurology followed the patient during hospitalization.  Board.  Patient was not on any anticoagulation prior to this admission.  CT scan repeated showed a stable ICH with no hydrocephalus.  2D echocardiogram with LV ejection fraction of 60 to 65%.  LDL at 118, hemoglobin A1c of 5.6.  Patient has been started on  Lovenox for DVT prophylaxis.  Physical therapy has seen the patient and recommended CIR at this time. ? ?Fever with mild leukocytosis.  Hospitalization.  This has resolved.  Chest x-ray with bibasilar airspace opacities and mild pleural effusion.  Temperature max of 98.5 ?F at this  time..  Patient received IV Zosyn per concerns of possible aspiration pneumonia.  Leukocytosis trending down at 13.2 from 13.1< 14.8 <16.4.  Zosyn was initiated 06/09/2021.  Plan for total of 7-day course. ? ?Hypernatremia.  Improved at this time.  Received IV fluids.  Latest sodium of 138 ? ?New-onset atrial fibrillation with RVR.   ?Cardiology on board. Had episodes of atrial flutter.  On oral Cardizem and p.o. amiodarone.  Currently controlled.  Heart rate of 55 ? ?Essential hypertension.   ?Elevated blood pressure on presentation.  Not on medications at home.  On as needed labetalol and hydralazine.  Systolic blood pressure goal less than 160.  Currently on oral Cardizem.  Currently at goal. ? ?Hyperlipidemia.  LDL of 118.  Continue Crestor. ? ?Acute kidney injury.  Resolved.   ? ?Deconditioning, debility ?Will need rehabilitation. ? ?Agitation, delirium. ?Needed restraints and Haldol.   ? ?Nutrition.  Speech therapy reassessment on 06/14/2021 without any improvement.  On Cortrak tube feeding.  Spoke with the patient's sister Hassan Rowan on the phone on 06/14/2021 and the family have decided to proceed with PEG tube placement.  Consulted IR who could not perform PEG tube due to bowels overlying the stomach.  So I have placed the surgery for surgical PEG tube placement ? ?Large left inguinal hernia.  CT scan was performed overnight without any evidence of obstruction.  General surgery will be consulted for PEG tube placement ? ?Goals of care ?Patient will likely have a prolonged course and might benefit from palliative care discussion.  We will consult palliative care today. ?  ? ?  DVT prophylaxis: enoxaparin (LOVENOX) injection 40  mg Start: 06/08/21 1200 ?SCD's Start: 06/04/21 0052 ? ?Code Status:   ?  Code Status: Full Code ? ?Disposition: CIR as per PT recommendation. ? ?Status is: Inpatient ? ?Remains inpatient appropriate because: Intracranial hemorrhage, need for rehabilitation, need for PEG tube placement. ? ?  Family Communication: ?Spoke with the patient's sister Ms. Steward Drone on the phone on 06/14/2021 and discussed about potential PEG tube placement. ? ?Consultants:  ?Neurology ?Cardiology ?Palliative care -will consult ? ?Procedures:  ?NG tube placement and removal ?Cortrak tube tube placement ? ?Antimicrobials:  ?Zosyn IV 4/5> ? ?Anti-infectives (From admission, onward)  ? ? Start     Dose/Rate Route Frequency Ordered Stop  ? 06/09/21 1400  piperacillin-tazobactam (ZOSYN) IVPB 3.375 g       ? 3.375 g ?12.5 mL/hr over 240 Minutes Intravenous Every 8 hours 06/09/21 1324    ? ?  ? ?Subjective: ?Today, patient was seen and examined at bedside.  Not much interval complaints.  Intermittently lethargic and groans.   ? ?Objective: ?Vitals:  ? 06/14/21 2330 06/15/21 0439 06/15/21 0444 06/15/21 0831  ?BP: (!) 151/60  137/62 (!) 154/52  ?Pulse: 62  63 (!) 55  ?Resp:    16  ?Temp: 98.5 ?F (36.9 ?C)  (!) 97.5 ?F (36.4 ?C) (!) 97.4 ?F (36.3 ?C)  ?TempSrc: Axillary  Axillary Axillary  ?SpO2: 95%  96% 97%  ?Weight:  60.3 kg    ? ? ?Intake/Output Summary (Last 24 hours) at 06/15/2021 1055 ?Last data filed at 06/15/2021 0607 ?Gross per 24 hour  ?Intake 2445 ml  ?Output 800 ml  ?Net 1645 ml  ? ?Filed Weights  ? 06/13/21 0400 06/14/21 0443 06/15/21 0439  ?Weight: 64.2 kg 65.2 kg 60.3 kg  ? ? ?Physical Examination: ? ?General:  Average built, not in obvious distress, cortrak tube tube in place, mildly sleepy, mumbling speech ?HENT:   No scleral pallor or icterus noted. Oral mucosa is moist.  ?Chest:    Diminished breath sounds bilaterally. No crackles or wheezes.  ?CVS: S1 &S2 heard. No murmur.  Regular rate and rhythm. ?Abdomen: Soft, nontender, nondistended.  Bowel sounds are heard.   ?Extremities: Right hemiplegia, increased tone on the left side. ?Psych: Mildly sleepy, moaning, moves extremities on command, ?CNS:  No cranial nerve deficits.  Right hemiplegia.  Increased tone on the left side. ?Skin: Warm and dry.  No rashes noted. ? ?Data  Reviewed:  ? ?CBC: ?Recent Labs  ?Lab 06/09/21 ?0409 06/11/21 ?0427 06/12/21 ?0156 06/13/21 ?7209 06/14/21 ?0430  ?WBC 16.5* 16.4* 14.8* 13.1* 13.2*  ?HGB 13.9 12.0* 11.7* 12.0* 11.7*  ?HCT 41.3 37.5* 36.0* 36.3* 36.0*  ?MCV 93.0 94.0 94.0 92.1 93.0  ?PLT 318 195 208 229 258  ? ? ?Basic Metabolic Panel: ?Recent Labs  ?Lab 06/08/21 ?1606 06/08/21 ?2220 06/10/21 ?1437 06/11/21 ?0427 06/11/21 ?1711 06/12/21 ?4709 06/13/21 ?6283 06/14/21 ?0430  ?NA  --    < > 151* 147* 146*  --  141 138  ?K  --    < > 3.5 4.3 3.9  --  4.0 4.2  ?CL  --    < > 123* 119* 118*  --  113* 110  ?CO2  --    < > 24 23 24   --  23 21*  ?GLUCOSE  --    < > 170* 208* 190*  --  167* 177*  ?BUN  --    < > 36* 35* 31*  --  28* 27*  ?CREATININE  --    < >  1.27* 1.19 1.00  --  0.94 0.97  ?CALCIUM  --    < > 8.4* 8.1* 8.3*  --  8.2* 8.1*  ?MG 2.9*  --   --  2.3  --  2.2 2.4 2.5*  ?PHOS 3.6  --   --  3.0  --   --   --   --   ? < > = values in this interval not displayed.  ? ? ?Liver Function Tests: ?No results for input(s): AST, ALT, ALKPHOS, BILITOT, PROT, ALBUMIN in the last 168 hours. ? ? ? ?Radiology Studies: ?CT ABDOMEN PELVIS WO CONTRAST ? ?Addendum Date: 06/15/2021   ?ADDENDUM REPORT: 06/15/2021 02:58 ADDENDUM: Results were discussed with the ordering physician at 1:45 a.m. [place] on December 15, 2021. Electronically Signed   By: Virgina Norfolk M.D.   On: 06/15/2021 02:58  ? ?Result Date: 06/15/2021 ?CLINICAL DATA:  Evaluate for left groin hernia. EXAM: CT ABDOMEN AND PELVIS WITHOUT CONTRAST TECHNIQUE: Multidetector CT imaging of the abdomen and pelvis was performed following the standard protocol without IV contrast. RADIATION DOSE REDUCTION: This exam was performed according to the departmental dose-optimization program which includes automated exposure control, adjustment of the mA and/or kV according to patient size and/or use of iterative reconstruction technique. COMPARISON:  None. FINDINGS: Lower chest: Mild to moderate severity atelectasis  is seen within the bilateral lung bases. There is a small right pleural effusion. Hepatobiliary: No focal liver abnormality is seen. No gallstones, gallbladder wall thickening, or biliary dilatation. [place]

## 2021-06-15 NOTE — Progress Notes (Signed)
Physical Therapy Treatment ?Patient Details ?Name: Louis Moore ?MRN: [id number] ?DOB: [birth year] ?Today's Date: 06/15/2021 ? ? ?History of Present Illness Patient is a 71 y/o male admitted with R hemiplegia and numbness found to have high L frontal ICH w/ mild cytotoxic edema, trace IV extension and 53mm midline shift. Stated on hypertonic saline, cleviprex.  No PMH as has not seen PCP in decades. ? ?  ?PT Comments  ? ? Pt more alert throughout, followed simple commands with visual cues added.  Continues inattention to the R environment and R side,  still displaying pusing behavior which generally escalate over course of the session, but there were several episodes where pt came into midline to command and cues.  Emphasis on transitions, sitting balance/holding midline and sit to stands at the bedside.  An attempt to use the STEDY for transfer to the chair aborted due to pt's came off the plat form when pt was push8ing laterally.  Will visit next session. ?  ?Recommendations for follow up therapy are one component of a multi-disciplinary discharge planning process, led by the attending physician.  Recommendations may be updated based on patient status, additional functional criteria and insurance authorization. ? ?Follow Up Recommendations ? Acute inpatient rehab (3hours/day) ?  ?  ?Assistance Recommended at Discharge Frequent or constant Supervision/Assistance  ?Patient can return home with the following Two people to help with walking and/or transfers;Two people to help with bathing/dressing/bathroom;Assistance with cooking/housework;Direct supervision/assist for medications management;Assist for transportation;Help with stairs or ramp for entrance;Direct supervision/assist for financial management;Assistance with feeding ?  ?Equipment Recommendations ? Other (comment)  ?  ?Recommendations for Other Services Rehab consult ? ? ?  ?Precautions / Restrictions Precautions ?Precautions: Fall ?Precaution Comments: R  neglect, hemiparesis, NG tube ?Restrictions ?Weight Bearing Restrictions: No  ?  ? ?Mobility ? Bed Mobility ?Overal bed mobility: Needs Assistance ?Bed Mobility: Rolling, Sidelying to Sit, Sit to Supine ?Rolling: Max assist, Total assist ?Sidelying to sit: Mod assist, +2 for physical assistance ?  ?Sit to supine: Max assist, +2 for physical assistance ?  ?General bed mobility comments: difficulty following directions for rolling. Participated in sidelying to sitting with using rail and attempting to push up with LUE ?  ? ?Transfers ?Overall transfer level: Needs assistance ?Equipment used: 2 person hand held assist, Ambulation equipment used ?Transfers: Sit to/from Stand, Bed to chair/wheelchair/BSC ?Sit to Stand: Max assist, +2 physical assistance ?  ?  ?  ?  ?  ?General transfer comment: attempted getting pt to the chair with the STEDY. biut pt's pushing behaviors extended to the L LE which fell off the platform while attempting to stand.  Transfer to chair aborted this time ,  sit to stand performed from EOB with max assist +2.  Sit to stand performed with [facility] and patient was unsafe for transfer to recliner ?Transfer via Lift Equipment: Stedy ? ?Ambulation/Gait ?  ?  ?  ?  ?  ?  ?  ?  ? ? ?Stairs ?  ?  ?  ?  ?  ? ? ?Wheelchair Mobility ?  ? ?Modified Rankin (Stroke Patients Only) ?Modified Rankin (Stroke Patients Only) ?Pre-Morbid Rankin Score: No symptoms ?Modified Rankin: Severe disability ? ? ?  ?Balance Overall balance assessment: Needs assistance ?Sitting-balance support: Feet supported ?Sitting balance-Leahy Scale: Zero ?Sitting balance - Comments: sat EOB with mod to max assist of 2.  Patient demonstrated pushing with LUE and was able to prevent on occasion with reaching tasks ?Postural control: Posterior lean, Right lateral  lean ?Standing balance support: Bilateral upper extremity supported ?Standing balance-Leahy Scale: Zero ?Standing balance comment: max assist x2 to stand from EOB and maintain  balance ?  ?  ?  ?  ?  ?  ?  ?  ?  ?  ?  ?  ? ?  ?Cognition Arousal/Alertness: Awake/alert ?Behavior During Therapy: Flat affect ?Overall Cognitive Status: Difficult to assess ?  ?  ?  ?  ?  ?  ?  ?  ?  ?  ?  ?  ?  ?  ?  ?  ?General Comments: alert followed commands for washing face and reaching tasks with 1 visual cue ?  ?  ? ?  ?Exercises Other Exercises ?Other Exercises: AA/PROM to bil LE's ? ?  ?General Comments   ?  ?  ? ?Pertinent Vitals/Pain Pain Assessment ?Pain Assessment: Faces ?Faces Pain Scale: No hurt  ? ? ?Home Living   ?  ?  ?  ?  ?  ?  ?  ?  ?  ?   ?  ?Prior Function    ?  ?  ?   ? ?PT Goals (current goals can now be found in the care plan section) Acute Rehab PT Goals ?PT Goal Formulation: With family ?Time For Goal Achievement: 06/20/21 ?Potential to Achieve Goals: Fair ?Progress towards PT goals: Progressing toward goals ? ?  ?Frequency ? ? ? Min 4X/week ? ? ? ?  ?PT Plan Current plan remains appropriate  ? ? ?Co-evaluation PT/OT/SLP Co-Evaluation/Treatment: Yes ?Reason for Co-Treatment: For patient/therapist safety ?PT goals addressed during session: Mobility/safety with mobility ?OT goals addressed during session: ADL's and self-care ?  ? ?  ?AM-PAC PT "6 Clicks" Mobility   ?Outcome Measure ? Help needed turning from your back to your side while in a flat bed without using bedrails?: Total ?Help needed moving from lying on your back to sitting on the side of a flat bed without using bedrails?: Total ?Help needed moving to and from a bed to a chair (including a wheelchair)?: Total ?Help needed standing up from a chair using your arms (e.g., wheelchair or bedside chair)?: Total ?Help needed to walk in hospital room?: Total ?Help needed climbing 3-5 steps with a railing? : Total ?6 Click Score: 6 ? ?  ?End of Session Equipment Utilized During Treatment: Gait belt ?Activity Tolerance: Patient tolerated treatment well;Patient limited by fatigue ?Patient left: in bed;with call bell/phone within  reach;with bed alarm set ?Nurse Communication: Mobility status ?PT Visit Diagnosis: Other abnormalities of gait and mobility (R26.89);Hemiplegia and hemiparesis;Other symptoms and signs involving the nervous system (R29.898) ?Hemiplegia - Right/Left: Right ?Hemiplegia - dominant/non-dominant: Dominant ?Hemiplegia - caused by: Nontraumatic intracerebral hemorrhage ?  ? ? ?Time: [id number] ?PT Time Calculation (min) (ACUTE ONLY): 30 min ? ?Charges:  $Neuromuscular Re-education: 8-22 mins          ?          ? ?06/15/2021 ? ?Ginger Carne., PT ?Acute Rehabilitation Services ?[phone]  (pager) ?[phone]  (office) ? ? ?Tessie Fass Nabeel Gladson ?06/15/2021, 3:03 PM ? ?

## 2021-06-15 NOTE — Progress Notes (Signed)
Pharmacy Antibiotic Note ? ?Louis Moore is a 71 y.o. male admitted on 06/04/2021 with  aspiration pneumonia .  Pharmacy consulted on 06/09/21 for Zosyn dosing for aspiration pneumonia. ? ?06/15/21: Afebrile and leukocytosis has trended down at 13.2 from 13.1< 14.8 <16.4.  Zosyn was initiated 06/09/2021. Dr. Louanne Belton noted plan for total of 7-day course, which will end on 06/16/21 after 6 AM dose given (total of 21 doses). ? ?Plan: ?- Continue piperacillin/tazobactam 3.375 g Q8H (extended-infusion) for total 7 days,  ends tomorrow  06/16/21 @ 13:59,  stop date entered.  ?- Monitor clinical status, renal function, cultures. ? ?Weight: 60.3 kg (132 lb 15 oz) ? ?Temp (24hrs), Avg:97.9 ?F (36.6 ?C), Min:97.4 ?F (36.3 ?C), Max:98.5 ?F (36.9 ?C) ? ?Recent Labs  ?Lab 06/09/21 ?0409 06/10/21 ?1437 06/11/21 ?0427 06/11/21 ?1711 06/12/21 ?[id number] 06/13/21 ?[id number] 06/14/21 ?0430  ?WBC 16.5*  --  16.4*  --  14.8* 13.1* 13.2*  ?CREATININE 1.51* 1.27* 1.19 1.00  --  0.94 0.97  ? ?  ?CrCl cannot be calculated (Unknown ideal weight.).   ? ?No Known Allergies ? ?Antimicrobials this admission: ?Piperacillin/tazobactam 4/5 >> ? ?Microbiology results: ?4/1 MRSA PCR: negative ? ?Thank you for allowing pharmacy to be a part of this patient?s care. ? ?Shauna Hugh, PharmD, RPh  ?PGY-2 Pharmacy Resident ?06/15/2021 3:57 PM ? ?Please check [email or web address] for unit-specific pharmacy phone numbers. ? ?

## 2021-06-15 NOTE — Progress Notes (Deleted)
Attempted to call family members multiple times today but I was unable to reach anyone. Cannot proceed with G tube placement until I can discuss procedure with family. Will hold tube feedings after midnight and reach out to family again in the morning.  ? ?Franne Forts, PA-C ?[facility] ?06/15/2021, 4:12 PM ?Please see Amion for pager number during day hours 7:00am-4:30pm ? ?

## 2021-06-15 NOTE — Progress Notes (Signed)
? ?  Request made for percutaneous gastric tube placement in IR ?I have had Dr Kathlene Cote review CT images ?Bowel overlies stomach completely: Unfavorable anatomy for IR placement ?No safe window at all for percutaneous gastric tube placement ? ?Consider surgical referral ? ?I will message MD ?

## 2021-06-15 NOTE — Progress Notes (Signed)
Occupational Therapy Treatment ?Patient Details ?Name: Louis Moore ?MRN: [id number] ?DOB: 15-Jun-1950 ?Today's Date: 06/15/2021 ? ? 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 Stated on hypertonic saline, cleviprex.  No PMH as has not seen PCP in decades. ?  ?OT comments ? Patient instructed on rolling to left and required max assist and assistance of 2 to get to EOB.  While on EOB patient presented with pushing using LUE and posterior, right lateral leaning. Patient was able to sit perform reaching tasks with LUE to address trunk control and posture. Patient was able to follow commands for reaching and face washing. Patient stood from EOB with max assist +2 and attempted transfer with stedy to recliner but patient was unsafe with use due to poor trunk control and pushing with LLE.  Patient to continue to be followed by acute OT.   ? ?Recommendations for follow up therapy are one component of a multi-disciplinary discharge planning process, led by the attending physician.  Recommendations may be updated based on patient status, additional functional criteria and insurance authorization. ?   ?Follow Up Recommendations ? Skilled nursing-short term rehab (<3 hours/day)  ?  ?Assistance Recommended at Discharge Frequent or constant Supervision/Assistance  ?Patient can return home with the following ? Two people to help with walking and/or transfers;A lot of help with bathing/dressing/bathroom;Assistance with feeding;Direct supervision/assist for medications management;Direct supervision/assist for financial management;Assist for transportation;Help with stairs or ramp for entrance ?  ?Equipment Recommendations ? BSC/3in1;Wheelchair (measurements OT);Wheelchair cushion (measurements OT);Hospital bed  ?  ?Recommendations for Other Services   ? ?  ?Precautions / Restrictions  Precautions ?Precautions: Fall ?Precaution Comments: R neglect, hemiparesis, NG tube ?Restrictions ?Weight Bearing Restrictions: No  ? ? ?  ? ?Mobility Bed Mobility ?Overal bed mobility: Needs Assistance ?Bed Mobility: Rolling, Sidelying to Sit, Sit to Supine ?Rolling: Max assist, Total assist ?Sidelying to sit: Mod assist, +2 for physical assistance ?  ?Sit to supine: Max assist, +2 for physical assistance ?  ?General bed mobility comments: difficulty following directions for rolling. Participated in sidelying to sitting with using rail and attempting to push up with LUE ?  ? ?Transfers ?Overall transfer level: Needs assistance ?Equipment used: 2 person hand held assist, Ambulation equipment used ?Transfers: Sit to/from Stand ?Sit to Stand: Max assist, +2 physical assistance ?  ?  ?  ?  ?  ?General transfer comment: sit to stand performed from EOB with max assist +2.  Sit to stand performed with [facility] and patient was unsafe for transfer to recliner ?  ?  ?Balance Overall balance assessment: Needs assistance ?Sitting-balance support: Feet supported ?Sitting balance-Leahy Scale: Zero ?Sitting balance - Comments: sat EOB with mod to max assist of 2.  Patient demonstrated pushing with LUE and was able to prevent on occasion with reaching tasks ?Postural control: Posterior lean, Right lateral lean ?Standing balance support: Bilateral upper extremity supported ?Standing balance-Leahy Scale: Zero ?Standing balance comment: max assist x2 to stand from EOB and maintain balance ?  ?  ?  ?  ?  ?  ?  ?  ?  ?  ?  ?   ? ?ADL either performed or assessed with clinical judgement  ? ?ADL Overall ADL's : Needs assistance/impaired ?  ?  ?Grooming: Wash/dry face;Minimal assistance;Sitting ?Grooming Details (indicate cue type and reason): washed face sitting on EOB with assistance  for balance ?  ?  ?  ?  ?  ?  ?  ?  ?  ?  ?  ?  ?  ?  ?  ?General ADL Comments: able to follow command to wash face ?  ? ?Extremity/Trunk Assessment Upper  Extremity Assessment ?RUE Deficits / Details: PROM WFL, did not withdraw to pain/deep nailbed pressure ?RUE Sensation: decreased light touch ?RUE Coordination: decreased fine motor;decreased gross motor ?  ?  ?  ?  ?  ? ?Vision   ?  ?  ?Perception   ?  ?Praxis   ?  ? ?Cognition Arousal/Alertness: Awake/alert ?Behavior During Therapy: Flat affect ?Overall Cognitive Status: Difficult to assess ?  ?  ?  ?  ?  ?  ?  ?  ?  ?  ?  ?  ?  ?  ?  ?  ?General Comments: alert followed commands for wsshing face and reaching tasks ?  ?  ?   ?Exercises   ? ?  ?Shoulder Instructions   ? ? ?  ?General Comments    ? ? ?Pertinent Vitals/ Pain       Pain Assessment ?Pain Assessment: Faces ?Faces Pain Scale: No hurt ?Pain Intervention(s): Monitored during session ? ?Home Living   ?  ?  ?  ?  ?  ?  ?  ?  ?  ?  ?  ?  ?  ?  ?  ?  ?  ?  ? ?  ?Prior Functioning/Environment    ?  ?  ?  ?   ? ?Frequency ? Min 2X/week  ? ? ? ? ?  ?Progress Toward Goals ? ?OT Goals(current goals can now be found in the care plan section) ? Progress towards OT goals: Progressing toward goals ? ?Acute Rehab OT Goals ?OT Goal Formulation: Patient unable to participate in goal setting ?Time For Goal Achievement: 06/21/21 ?Potential to Achieve Goals: Fair ?ADL Goals ?Pt Will Perform Eating: with set-up;with adaptive utensils;sitting ?Pt Will Perform Grooming: with supervision;sitting ?Pt Will Perform Upper Body Dressing: sitting;with min guard assist ?Pt Will Perform Lower Body Dressing: with mod assist;with caregiver independent in assisting;sitting/lateral leans ?Pt Will Transfer to Toilet: with min assist;stand pivot transfer;bedside commode ?Pt Will Perform Toileting - Clothing Manipulation and hygiene: with mod assist;sit to/from stand ?Additional ADL Goal #1: Pt will attend to his right side of his body for ADL 50% of the time  ?Plan Discharge plan remains appropriate   ? ?Co-evaluation ? ? ? PT/OT/SLP Co-Evaluation/Treatment: Yes ?Reason for Co-Treatment: For  patient/therapist safety;To address functional/ADL transfers ?  ?OT goals addressed during session: ADL's and self-care ?  ? ?  ?AM-PAC OT "6 Clicks" Daily Activity     ?Outcome Measure ? ? Help from another person eating meals?: Total ?Help from another person taking care of personal grooming?: A Lot ?Help from another person toileting, which includes using toliet, bedpan, or urinal?: Total ?Help from another person bathing (including washing, rinsing, drying)?: A Lot ?Help from another person to put on and taking off regular upper body clothing?: Total ?Help from another person to put on and taking off regular lower body clothing?: Total ?6 Click Score: 8 ? ?  ?End of Session Equipment Utilized During Treatment: Gait belt ? ?OT Visit Diagnosis: Unsteadiness on feet (R26.81);Other abnormalities of gait and mobility (R26.89);Muscle weakness (generalized) (M62.81);Other symptoms and signs involving the nervous system (R29.898);Other symptoms and signs involving cognitive function;Hemiplegia and hemiparesis ?Hemiplegia - Right/Left: Right ?Hemiplegia -  dominant/non-dominant: Dominant ?Hemiplegia - caused by: Nontraumatic SAH ?  ?Activity Tolerance Patient limited by fatigue ?  ?Patient Left in bed;with call bell/phone within reach;with bed alarm set ?  ?Nurse Communication Mobility status ?  ? ?   ? ?Time: [id number] ?OT Time Calculation (min): 33 min ? ?Charges: OT General Charges ?$OT Visit: 1 Visit ?OT Treatments ?$Therapeutic Activity: 8-22 mins ? ?Alfonse Flavorsick Bijou Easler, OTA ?Acute Rehabilitation Services  ?Pager [phone] ?Office [phone] ? ? ?Demmi Sindt Jeannett SeniorL Jeanetta Alonzo ?06/15/2021, 2:52 PM ?

## 2021-06-15 NOTE — Progress Notes (Signed)
Nutrition Follow-up ? ?DOCUMENTATION CODES:  ?Severe malnutrition in context of chronic illness ? ?INTERVENTION:  ?Continue TF via Cortrak: ?-Osmolite 1.5 @ 19m/hr (12032mday) ?-9064mrosource TF BID ?-200m85mee water Q4H  ? ?Provides 1960 kcal, 119 grams protein, 912 ml free water (2112ml97mal free water w/ flushes) ? ?NUTRITION DIAGNOSIS:  ?Severe Malnutrition related to chronic illness (uncontrolled HTN) as evidenced by severe muscle depletion, severe fat depletion. -- ongoing ? ?GOAL:  ?Patient will meet greater than or equal to 90% of their needs -- met with TF at goal ? ?MONITOR:  ?TF tolerance, PO intake, Diet advancement ? ?REASON FOR ASSESSMENT:  ?Consult ?Enteral/tube feeding initiation and management ? 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 New onset Afib, HTN, HLD. ? ?4/3 cortrak placement unsuccessful ?4/4 NG tube placement, tip gastric  ?4/7 NGT exchanged for cortrak (tip gastric)  ? ?Pt is intermittently lethargic with no improvement in swallowing efforts per MD. SLP following. Note pt was evaluated by IR for PEG placement but is noted to have unfavorable anatomy to proceed. IR recommending surgical referral.  ? ?Tolerating the following via Cortrak: Osmolite 1.5 @ 50ml/22mProsource TF 90ml B72m200ml fr61mater Q4H  ? ?UOP: 1600ml x2474mrs ?I/O: +2003ml sinc53mmit ? ?Medications: ? pantoprazole sodium  40 mg Per Tube QHS  ? thiamine  100 mg Per Tube Daily  ?IV abx ? ?Labs: ?Recent Labs  ?Lab 06/08/21 ?1606 06/08/21 ?2220 06/11/21 ?0427 06/11/21 ?1711 06/12/21 ?0156 04/091937?0609 04/109024?0430  ?NA  --    < > 147* 146*  --  141 138  ?K  --    < > 4.3 3.9  --  4.0 4.2  ?CL  --    < > 119* 118*  --  113* 110  ?CO2  --    < > 23 24  --  23 21*  ?BUN  --    < > 35* 31*  --  28* 27*  ?CREATININE  --    < > 1.19 1.00  --  0.94 0.97  ?CALCIUM  --    < > 8.1* 8.3*  --  8.2* 8.1*  ?MG 2.9*   --  2.3  --  2.2 2.4 2.5*  ?PHOS 3.6  --  3.0  --   --   --   --   ?GLUCOSE  --    < > 208* 190*  --  167* 177*  ? < > = values in this interval not displayed.  ?CBGs: 145-216 x24 hours ? ?Diet Order:   ?Diet Order   ? ?       ?  Diet NPO time specified Except for: Ice Chips  Diet effective now       ?  ? ?  ?  ? ?  ? ?EDUCATION NEEDS:  ?Not appropriate for education at this time ? ?Skin:  Skin Assessment: Reviewed RN Assessment ? ?Last BM:  4/10 type 7 ? ?Height:  ?Ht Readings from Last 1 Encounters:  ?No data found for Ht  ? ?Weight:  ?Wt Readings from Last 1 Encounters:  ?06/15/21 60.3 kg  ? ?BMI:  There is no height or weight on file to calculate BMI. ? ?Estimated Nutritional Needs:  ?Kcal:  1900-2100 ?Protein:  105-115 grams ?Fluid:  2 L/day ? ? ? ?Louis Moore A.,Theone Stanley LDN (she/her/hers) ?RD pager number and  weekend/on-call pager number located in [place]. ?

## 2021-06-16 ENCOUNTER — Inpatient Hospital Stay (HOSPITAL_COMMUNITY): Payer: Medicare Other | Admitting: Anesthesiology

## 2021-06-16 ENCOUNTER — Encounter (HOSPITAL_COMMUNITY): Payer: Self-pay | Admitting: Neurology

## 2021-06-16 ENCOUNTER — Encounter (HOSPITAL_COMMUNITY)
Admission: EM | Disposition: A | Discharge status: Skilled Nursing Facility | Payer: Self-pay | Source: Home / Self Care | Attending: Neurology

## 2021-06-16 DIAGNOSIS — Z7189 Other specified counseling: Secondary | ICD-10-CM

## 2021-06-16 DIAGNOSIS — G8191 Hemiplegia, unspecified affecting right dominant side: Secondary | ICD-10-CM | POA: Diagnosis not present

## 2021-06-16 DIAGNOSIS — R1310 Dysphagia, unspecified: Secondary | ICD-10-CM | POA: Diagnosis not present

## 2021-06-16 DIAGNOSIS — R627 Adult failure to thrive: Secondary | ICD-10-CM | POA: Diagnosis not present

## 2021-06-16 DIAGNOSIS — E870 Hyperosmolality and hypernatremia: Secondary | ICD-10-CM | POA: Diagnosis not present

## 2021-06-16 DIAGNOSIS — I480 Paroxysmal atrial fibrillation: Secondary | ICD-10-CM | POA: Diagnosis not present

## 2021-06-16 DIAGNOSIS — E43 Unspecified severe protein-calorie malnutrition: Secondary | ICD-10-CM | POA: Diagnosis not present

## 2021-06-16 DIAGNOSIS — Z515 Encounter for palliative care: Secondary | ICD-10-CM

## 2021-06-16 DIAGNOSIS — I610 Nontraumatic intracerebral hemorrhage in hemisphere, subcortical: Secondary | ICD-10-CM | POA: Diagnosis not present

## 2021-06-16 DIAGNOSIS — I69391 Dysphagia following cerebral infarction: Secondary | ICD-10-CM

## 2021-06-16 HISTORY — PX: LAPAROSCOPY: SHX197

## 2021-06-16 HISTORY — PX: PEG PLACEMENT: SHX5437

## 2021-06-16 LAB — GLUCOSE, CAPILLARY
Glucose-Capillary: 92 mg/dL (ref 70–99)
Glucose-Capillary: 97 mg/dL (ref 70–99)
Glucose-Capillary: 98 mg/dL (ref 70–99)
Glucose-Capillary: 122 mg/dL — ABNORMAL HIGH (ref 70–99)
Glucose-Capillary: 142 mg/dL — ABNORMAL HIGH (ref 70–99)

## 2021-06-16 LAB — SURGICAL PCR SCREEN
MRSA, PCR: NEGATIVE
Staphylococcus aureus: POSITIVE — AB

## 2021-06-16 LAB — CBC
HCT: 34.8 % — ABNORMAL LOW (ref 39.0–52.0)
Hemoglobin: 12 g/dL — ABNORMAL LOW (ref 13.0–17.0)
MCH: 30.8 pg (ref 26.0–34.0)
MCHC: 34.5 g/dL (ref 30.0–36.0)
MCV: 89.5 fL (ref 80.0–100.0)
Platelets: 301 10*3/uL (ref 150–400)
RBC: 3.89 MIL/uL — ABNORMAL LOW (ref 4.22–5.81)
RDW: 13 % (ref 11.5–15.5)
WBC: 13.9 10*3/uL — ABNORMAL HIGH (ref 4.0–10.5)
nRBC: 0 % (ref 0.0–0.2)

## 2021-06-16 LAB — BASIC METABOLIC PANEL
Anion gap: 6 (ref 5–15)
BUN: 24 mg/dL — ABNORMAL HIGH (ref 8–23)
CO2: 23 mmol/L (ref 22–32)
Calcium: 8.2 mg/dL — ABNORMAL LOW (ref 8.9–10.3)
Chloride: 107 mmol/L (ref 98–111)
Creatinine, Ser: 0.89 mg/dL (ref 0.61–1.24)
GFR, Estimated: >60 mL/min (ref >60)
Glucose, Bld: 104 mg/dL — ABNORMAL HIGH (ref 70–99)
Potassium: 4.2 mmol/L (ref 3.5–5.1)
Sodium: 136 mmol/L (ref 135–145)

## 2021-06-16 LAB — MAGNESIUM: Magnesium: 2.5 mg/dL — ABNORMAL HIGH (ref 1.7–2.4)

## 2021-06-16 SURGERY — LAPAROSCOPY DIAGNOSTIC
Anesthesia: General | Site: Abdomen

## 2021-06-16 MED ORDER — LIDOCAINE 2% (20 MG/ML) 5 ML SYRINGE
INTRAMUSCULAR | Status: DC | PRN
  Administered 2021-06-16: 60 mg via INTRAVENOUS

## 2021-06-16 MED ORDER — ONDANSETRON HCL 4 MG/2ML IJ SOLN
INTRAMUSCULAR | Status: AC
  Filled 2021-06-16: qty 6

## 2021-06-16 MED ORDER — 0.9 % SODIUM CHLORIDE (POUR BTL) OPTIME
TOPICAL | Status: DC | PRN
  Administered 2021-06-16: 1000 mL

## 2021-06-16 MED ORDER — CHLORHEXIDINE GLUCONATE 0.12 % MT SOLN
15.0000 mL | Freq: Once | OROMUCOSAL | Status: AC
  Administered 2021-06-16: 15 mL via OROMUCOSAL

## 2021-06-16 MED ORDER — PROMETHAZINE HCL 25 MG/ML IJ SOLN: 6.2500 mg | INTRAMUSCULAR | Status: DC | PRN

## 2021-06-16 MED ORDER — SUGAMMADEX SODIUM 200 MG/2ML IV SOLN
INTRAVENOUS | Status: DC | PRN
  Administered 2021-06-16: 200 mg via INTRAVENOUS

## 2021-06-16 MED ORDER — SUCCINYLCHOLINE CHLORIDE 200 MG/10ML IV SOSY
PREFILLED_SYRINGE | INTRAVENOUS | Status: AC
  Filled 2021-06-16: qty 20

## 2021-06-16 MED ORDER — ONDANSETRON HCL 4 MG/2ML IJ SOLN
INTRAMUSCULAR | Status: DC | PRN
  Administered 2021-06-16: 4 mg via INTRAVENOUS

## 2021-06-16 MED ORDER — AMISULPRIDE (ANTIEMETIC) 5 MG/2ML IV SOLN: 10.0000 mg | Freq: Once | INTRAVENOUS | Status: DC | PRN

## 2021-06-16 MED ORDER — ROCURONIUM BROMIDE 10 MG/ML (PF) SYRINGE
PREFILLED_SYRINGE | INTRAVENOUS | Status: DC | PRN
  Administered 2021-06-16: 40 mg via INTRAVENOUS
  Administered 2021-06-16: 50 mg via INTRAVENOUS

## 2021-06-16 MED ORDER — OXYCODONE HCL 5 MG PO TABS: 5.0000 mg | ORAL_TABLET | Freq: Once | ORAL | Status: DC | PRN

## 2021-06-16 MED ORDER — SODIUM CHLORIDE (PF) 0.9 % IJ SOLN
INTRAMUSCULAR | Status: AC
  Filled 2021-06-16: qty 10

## 2021-06-16 MED ORDER — ORAL CARE MOUTH RINSE: 15.0000 mL | Freq: Once | OROMUCOSAL | Status: AC

## 2021-06-16 MED ORDER — ROCURONIUM BROMIDE 10 MG/ML (PF) SYRINGE
PREFILLED_SYRINGE | INTRAVENOUS | Status: AC
  Filled 2021-06-16: qty 30

## 2021-06-16 MED ORDER — PHENYLEPHRINE 40 MCG/ML (10ML) SYRINGE FOR IV PUSH (FOR BLOOD PRESSURE SUPPORT)
PREFILLED_SYRINGE | INTRAVENOUS | Status: DC | PRN
Start: 2021-06-16 — End: 2021-06-16
  Administered 2021-06-16: 80 ug via INTRAVENOUS

## 2021-06-16 MED ORDER — CHLORHEXIDINE GLUCONATE 0.12 % MT SOLN
OROMUCOSAL | Status: AC
  Filled 2021-06-16: qty 15

## 2021-06-16 MED ORDER — FENTANYL CITRATE (PF) 250 MCG/5ML IJ SOLN
INTRAMUSCULAR | Status: AC
  Filled 2021-06-16: qty 5

## 2021-06-16 MED ORDER — FENTANYL CITRATE (PF) 250 MCG/5ML IJ SOLN
INTRAMUSCULAR | Status: DC | PRN
  Administered 2021-06-16: 75 ug via INTRAVENOUS

## 2021-06-16 MED ORDER — DILTIAZEM HCL 30 MG PO TABS
30.0000 mg | ORAL_TABLET | Freq: Four times a day (QID) | ORAL | Status: DC
  Administered 2021-06-16 – 2021-06-18 (×7): 30 mg via ORAL
  Filled 2021-06-16 (×7): qty 1

## 2021-06-16 MED ORDER — STERILE WATER FOR IRRIGATION IR SOLN
Status: DC | PRN
  Administered 2021-06-16: 1000 mL

## 2021-06-16 MED ORDER — BUPIVACAINE HCL (PF) 0.25 % IJ SOLN
INTRAMUSCULAR | Status: AC
  Filled 2021-06-16: qty 30

## 2021-06-16 MED ORDER — DEXAMETHASONE SODIUM PHOSPHATE 10 MG/ML IJ SOLN
INTRAMUSCULAR | Status: DC | PRN
  Administered 2021-06-16: 5 mg via INTRAVENOUS

## 2021-06-16 MED ORDER — OXYCODONE HCL 5 MG/5ML PO SOLN: 5.0000 mg | Freq: Once | ORAL | Status: DC | PRN

## 2021-06-16 MED ORDER — LIDOCAINE 2% (20 MG/ML) 5 ML SYRINGE
INTRAMUSCULAR | Status: AC
  Filled 2021-06-16: qty 15

## 2021-06-16 MED ORDER — PHENYLEPHRINE 40 MCG/ML (10ML) SYRINGE FOR IV PUSH (FOR BLOOD PRESSURE SUPPORT)
PREFILLED_SYRINGE | INTRAVENOUS | Status: AC
  Filled 2021-06-16: qty 20

## 2021-06-16 MED ORDER — LACTATED RINGERS IV SOLN: INTRAVENOUS | Status: DC

## 2021-06-16 MED ORDER — PROPOFOL 10 MG/ML IV BOLUS
INTRAVENOUS | Status: DC | PRN
Start: 2021-06-16 — End: 2021-06-16
  Administered 2021-06-16: 100 mg via INTRAVENOUS

## 2021-06-16 MED ORDER — HYDROMORPHONE HCL 1 MG/ML IJ SOLN: 0.2500 mg | INTRAMUSCULAR | Status: DC | PRN

## 2021-06-16 MED ORDER — DEXAMETHASONE SODIUM PHOSPHATE 10 MG/ML IJ SOLN
INTRAMUSCULAR | Status: AC
  Filled 2021-06-16: qty 3

## 2021-06-16 MED ORDER — BUPIVACAINE HCL 0.25 % IJ SOLN
INTRAMUSCULAR | Status: DC | PRN
  Administered 2021-06-16: 20 mL

## 2021-06-16 MED ORDER — MUPIROCIN 2 % EX OINT
1.0000 "application " | TOPICAL_OINTMENT | Freq: Two times a day (BID) | CUTANEOUS | Status: DC
  Administered 2021-06-16 – 2021-06-18 (×4): 1 via NASAL
  Filled 2021-06-16: qty 22

## 2021-06-16 MED ORDER — PROPOFOL 10 MG/ML IV BOLUS
INTRAVENOUS | Status: AC
  Filled 2021-06-16: qty 20

## 2021-06-16 SURGICAL SUPPLY — 39 items
BAG COUNTER SPONGE SURGICOUNT (BAG) ×2
BAG URINE DRAIN 2000ML AR STRL (UROLOGICAL SUPPLIES) ×2
BINDER ABDOMINAL 12 ML 46-62 (SOFTGOODS) ×2
BLADE CLIPPER SURG (BLADE)
BLOCK BITE 60FR ADLT L/F BLUE (MISCELLANEOUS) ×2
BUTTON OLYMPUS DEFENDO 5 PIECE (MISCELLANEOUS) ×2
CANISTER SUCT 3000ML PPV (MISCELLANEOUS) ×2
CHLORAPREP W/TINT 26 (MISCELLANEOUS) ×2
COVER SURGICAL LIGHT HANDLE (MISCELLANEOUS) ×2
DERMABOND ADVANCED (GAUZE/BANDAGES/DRESSINGS) ×1
DERMABOND ADVANCED .7 DNX12 (GAUZE/BANDAGES/DRESSINGS) ×1
ELECT REM PT RETURN 9FT ADLT (ELECTROSURGICAL) ×2
GLOVE SURG POLY MICRO LF SZ5.5 (GLOVE) ×2
GLOVE SURG UNDER POLY LF SZ6 (GLOVE) ×2
GOWN STRL REUS W/ TWL LRG LVL3 (GOWN DISPOSABLE) ×2
GOWN STRL REUS W/TWL LRG LVL3 (GOWN DISPOSABLE) ×2
KIT BASIN OR (CUSTOM PROCEDURE TRAY) ×2
KIT CLEAN ENDO COMPLIANCE (KITS) ×2
KIT TURNOVER KIT B (KITS) ×2
NDL INSUFFLATION 14GA 120MM (NEEDLE) ×1 IMPLANT
NEEDLE INSUFFLATION 14GA 120MM (NEEDLE) ×2
NS IRRIG 1000ML POUR BTL (IV SOLUTION) ×2
PAD ARMBOARD 7.5X6 YLW CONV (MISCELLANEOUS) ×4
SCISSORS LAP 5X35 DISP (ENDOMECHANICALS)
SET IRRIG TUBING LAPAROSCOPIC (IRRIGATION / IRRIGATOR)
SET TUBE SMOKE EVAC HIGH FLOW (TUBING) ×2
SLEEVE ENDOPATH XCEL 5M (ENDOMECHANICALS) ×2
SUT ETHILON 2 0 FS 18 (SUTURE) ×2
SUT MNCRL AB 4-0 PS2 18 (SUTURE) ×2
TOWEL GREEN STERILE (TOWEL DISPOSABLE) ×2
TOWEL GREEN STERILE FF (TOWEL DISPOSABLE) ×2
TRAY LAPAROSCOPIC MC (CUSTOM PROCEDURE TRAY) ×2
TROCAR XCEL BLUNT TIP 100MML (ENDOMECHANICALS)
TROCAR XCEL NON-BLD 5MMX100MML (ENDOMECHANICALS) ×2
TUBE CONNECTING 12X1/4 (SUCTIONS) ×2
TUBE ENDOVIVE SAFETY PEG 24 (TUBING) ×2
TUBING ENDO SMARTCAP (MISCELLANEOUS) ×2
WARMER LAPAROSCOPE (MISCELLANEOUS) ×2
WATER STERILE IRR 1000ML POUR (IV SOLUTION) ×2

## 2021-06-16 NOTE — Anesthesia Procedure Notes (Signed)
Procedure Name: Intubation ?Date/Time: 06/16/2021 2:14 PM ?Performed by: Imagene Riches, CRNA ?Pre-anesthesia Checklist: Patient identified, Emergency Drugs available, Suction available and Patient being monitored ?Patient Re-evaluated:Patient Re-evaluated prior to induction ?Oxygen Delivery Method: Circle System Utilized ?Preoxygenation: Pre-oxygenation with 100% oxygen ?Induction Type: IV induction ?Ventilation: Mask ventilation without difficulty ?Laryngoscope Size: Sabra Heck and 2 ?Grade View: Grade I ?Tube type: Oral ?Tube size: 7.5 mm ?Number of attempts: 1 ?Airway Equipment and Method: Stylet and Oral airway ?Placement Confirmation: ETT inserted through vocal cords under direct vision, positive ETCO2 and breath sounds checked- equal and bilateral ?Secured at: 21 cm ?Tube secured with: Tape ?Dental Injury: Teeth and Oropharynx as per pre-operative assessment  ? ? ? ? ?

## 2021-06-16 NOTE — Progress Notes (Signed)
SLP Cancellation Note ? ?Patient Details ?Name: Abem Shaddix ?MRN: [id number] ?DOB: 07/01/1950 ? ? ?Cancelled treatment:       Reason Eval/Treat Not Completed: Patient not medically ready (Pt NPO for PEG placement. SLP will follow up on subsequent date.)  ? ?Jatavian Calica I. Vear Clock, MS, CCC-SLP ?Acute Rehabilitation Services ?Office number [phone] ?Pager [phone] ? ?Scheryl Marten ?06/16/2021, 12:03 PM ? ? ?

## 2021-06-16 NOTE — Consult Note (Addendum)
? ?Palliative Care Consult Note  ?                                ?Date: 06/16/2021  ? ?Patient Name: Louis Moore  ?DOB: May 26, 1950  MRN: [id number]  Age / Sex: 71 y.o., male  ?PCP: Pcp, No ?Referring Physician: Florencia Reasons, MD ? ?Reason for Consultation: Establishing goals of care ? ?HPI/Patient Profile: 71 y.o. male  with no significant past medical history but has not seen a PCP in decades.  He presented to the emergency department on 06/04/2021 as a code stroke with right-sided weakness and numbness in his fingers.  He woke up with numbness and weakness on the right side and slipped out of bed but did not fall.  Per EMS, SBP initially was in the 190s.  Imaging showed acute intraparenchymal hemorrhage in the left posterior frontal lobe with associated cerebral edema, mass effect, midline shift, and brain compression.  He was admitted to ICU under neurology service.  His hospital course has been complicated by possible aspiration pneumonia (treated and resolved), new onset A-fib with RVR, and AKI (resolved). ?Transferred to hospitalist service on 06/10/21.  ? ? ?Subjective:  ? ?I have reviewed medical records including EPIC notes, labs and imaging, and assessed the patient at bedside. He has significant right-sided weakness. He is alert.  ? ?I met with patient's brother Reita Cliche and sisters Juliann Pulse and Hassan Rowan in the 3W waiting room to discuss diagnosis, prognosis, GOC, EOL wishes, disposition, and options. ? ?I introduced Palliative Medicine as specialized medical care for people living with serious illness. It focuses on providing relief from the symptoms and stress of a serious illness.  ? ?Created space and opportunity for patient and family to explore thoughts and feelings regarding current medical situation. ? ?Values and goals of care important to patient and family were attempted to be elicited. ? ?Questions and concerns addressed. Patient/family encouraged to call  with questions or concerns.  Provided family with a copy of "hard choices" book.  ? ? ?Life Review: ?Louis Moore was born in [place] but has spent most of his life in the [place] area.  He retired from work in a Copywriter, advertising.  He did not marry and has no children.  He is 1 of 4 siblings and they have remained very close.  Family describes him as "easy-going" and a "jokester".  ? ?Functional Status: ?Prior to admission, patient was ambulatory and independent.  ? ?Patient/Family Understanding of Illness: ?Family verbalizes understanding that his illness is "pretty serious" and states that "initially it was not looking too good".  Family remains very hopeful for improvement - they are "holding out hope" that [name] can recover as much as possible. ? ?Goals: ?To go to acute inpatient rehab (CIR) with the goal of improving functional status as much as possible.  ? ?Additional Discussion: ?We reviewed patient's current clinical condition in detail. Discussed the natural disease trajectory of a sudden, severe neurological injury such as ICH. Discussed that patient will likely not return to his previous baseline. Provided education that prognostication can be challenging due to the uncertainty predicting neurologic outcome as well as the time required to allow for optimal recovery (weeks to sometimes months). ? ?The difference between full scope medical intervention and comfort care was considered. I introduced the concept of a comfort path to family, emphasizing that this path involves de-escalating and stopping full scope medical interventions,  allowing a natural course to occur. Discussed that the goal is comfort and dignity rather than cure/prolonging life. Encouraged family to consider at what point they would want to stop full scope medical interventions, keeping in mind the concept of quality of life.  ? ?At this time, family is interested in all aggressive medical interventions  offered. We did discuss code status. Encouraged family to consider DNR/DNI status understanding evidenced based poor outcomes in similar hospitalized patients, as the cause of the arrest is likely associated with chronic/terminal disease rather than a reversible acute cardio-pulmonary event. I explained that DNR/DNI does not change the medical plan and it only comes into effect after a person has arrested (died).  It is a protective measure to keep [place] from harming the patient in their last moments of life.  ?Family verbalizes understanding and will consider but wish to continue full code status at this time.   ? ? ?Review of Systems  ?Unable to perform ROS: Other  ? ?Objective:  ? ?Primary Diagnoses: ?Present on Admission: ? ICH (intracerebral hemorrhage) ([place]) ? ? ?Physical Exam ?Vitals reviewed.  ?Constitutional:   ?   General: He is not in acute distress. ?   Appearance: He is ill-appearing.  ?Neurological:  ?   Mental Status: He is alert.  ?   Motor: Weakness present.  ?   Comments: Right sided weakness/deficit ?Speech is garbled  ?Psychiatric:  ?   Comments: Restless at times  ? ? ?Vital Signs:  ?BP (!) 147/66   Pulse 67   Temp 97.9 ?F (36.6 ?C)   Resp 16   Wt 60.3 kg   SpO2 95%  ? ?Palliative Assessment/Data: PPS 30-40% ? ? ? ? ?Assessment & Plan:  ? ?SUMMARY OF RECOMMENDATIONS   ?Full code full scope ?Goal is CIR to improve functional status as much as possible ?Family is hopeful for continued improvement ?PMT will continue to follow ? ?Primary Decision Maker: ?Patient's 3 siblings make decisions together ? ?Prognosis:  ?Unable to determine ? ?Discharge Planning:  ?Acute inpatient rehab (CIR)   ? ? ? ?Thank you for allowing [place] to participate in the care of Louis Moore ? ? ?Time Total: 90 minutes ? ?Greater than 50%  of this time was spent counseling and coordinating care related to the above assessment and plan. ? ?Signed by: ?Elie Confer, NP ?Palliative Medicine Team ? ?Team Phone # [phone]   ?For individual providers, please see AMION ? ? ? ? ? ? ? ? ? ? ? ? ? ? ? ?

## 2021-06-16 NOTE — Transfer of Care (Signed)
Immediate Anesthesia Transfer of Care Note ? ?Patient: Louis Moore ? ?Procedure(s) Performed: LAPAROSCOPIC - ASSISTED PEG (Abdomen) ?PERCUTANEOUS ENDOSCOPIC GASTROSTOMY (PEG) PLACEMENT (Abdomen) ? ?Patient Location: PACU ? ?Anesthesia Type:General ? ?Level of Consciousness: drowsy ? ?Airway & Oxygen Therapy: Patient Spontanous Breathing ? ?Post-op Assessment: Report given to RN and Post -op Vital signs reviewed and stable ? ?Post vital signs: Reviewed and stable ? ?Last Vitals:  ?Vitals Value Taken Time  ?BP 178/71 06/16/21 1515  ?Temp 36.1 ?C 06/16/21 1515  ?Pulse 58 06/16/21 1515  ?Resp 18 06/16/21 1515  ?SpO2 95 % 06/16/21 1515  ?Vitals shown include unvalidated device data. ? ?Last Pain:  ?Vitals:  ? 06/16/21 0737  ?TempSrc: Axillary  ?PainSc:   ?   ? ?  ? ?Complications: No notable events documented. ?

## 2021-06-16 NOTE — Progress Notes (Signed)
? ?Progress Note ? ?   ?Subjective: ?NAEON. Patient resting comfortably and without complaints. Confirmed with RN that tube feeds were held at midnight ? ? ?Objective: ?Vital signs in last 24 hours: ?Temp:  [97.4 ?F (36.3 ?C)-98.5 ?F (36.9 ?C)] 98.4 ?F (36.9 ?C) (04/12 2094) ?Pulse Rate:  [49-65] 49 (04/12 0737) ?Resp:  [14-19] 14 (04/12 0737) ?BP: (124-165)/(44-65) 124/44 (04/12 0737) ?SpO2:  [95 %-100 %] 97 % (04/12 0737) ?Last BM Date : 06/15/21 ? ?Intake/Output from previous day: ?04/11 0701 - 04/12 0700 ?In: -  ?Out: 650 [Urine:650] ?Intake/Output this shift: ?Total I/O ?In: -  ?Out: 400 [Urine:400] ? ?PE: ?General: pleasant, WD, male who is laying in bed in NAD ?Lungs:Respiratory effort nonlabored ?Abd: soft, NT, ND, +BS, no masses, hernias, or organomegaly. Cortrack clamped ?MSK: all 4 extremities are symmetrical with no cyanosis, clubbing, or edema. ?Skin: warm and dry with no masses, lesions, or rashes ?Psych: alert, oriented to self ? ? ?Lab Results:  ?Recent Labs  ?  06/14/21 ?0430 06/16/21 ?0410  ?WBC 13.2* 13.9*  ?HGB 11.7* 12.0*  ?HCT 36.0* 34.8*  ?PLT 258 301  ? ?BMET ?Recent Labs  ?  06/14/21 ?0430 06/16/21 ?0410  ?NA 138 136  ?K 4.2 4.2  ?CL 110 107  ?CO2 21* 23  ?GLUCOSE 177* 104*  ?BUN 27* 24*  ?CREATININE 0.97 0.89  ?CALCIUM 8.1* 8.2*  ? ?PT/INR ?No results for input(s): LABPROT, INR in the last 72 hours. ?CMP  ?   ?Component Value Date/Time  ? NA 136 06/16/2021 0410  ? K 4.2 06/16/2021 0410  ? CL 107 06/16/2021 0410  ? CO2 23 06/16/2021 0410  ? GLUCOSE 104 (H) 06/16/2021 0410  ? BUN 24 (H) 06/16/2021 0410  ? CREATININE 0.89 06/16/2021 0410  ? CALCIUM 8.2 (L) 06/16/2021 0410  ? PROT 6.4 (L) 06/04/2021 0028  ? ALBUMIN 3.6 06/04/2021 0028  ? AST 19 06/04/2021 0028  ? ALT 13 06/04/2021 0028  ? ALKPHOS 56 06/04/2021 0028  ? BILITOT 0.5 06/04/2021 0028  ? GFRNONAA >60 06/16/2021 0410  ? ?Lipase  ?No results found for: LIPASE ? ? ? ? ?Studies/Results: ?CT ABDOMEN PELVIS WO CONTRAST ? ?Addendum Date:  06/15/2021   ?ADDENDUM REPORT: 06/15/2021 02:58 ADDENDUM: Results were discussed with the ordering physician at 1:45 a.m. [place] on December 15, 2021. Electronically Signed   By: Aram Candela M.D.   On: 06/15/2021 02:58  ? ?Result Date: 06/15/2021 ?CLINICAL DATA:  Evaluate for left groin hernia. EXAM: CT ABDOMEN AND PELVIS WITHOUT CONTRAST TECHNIQUE: Multidetector CT imaging of the abdomen and pelvis was performed following the standard protocol without IV contrast. RADIATION DOSE REDUCTION: This exam was performed according to the departmental dose-optimization program which includes automated exposure control, adjustment of the mA and/or kV according to patient size and/or use of iterative reconstruction technique. COMPARISON:  None. FINDINGS: Lower chest: Mild to moderate severity atelectasis is seen within the bilateral lung bases. There is a small right pleural effusion. Hepatobiliary: No focal liver abnormality is seen. No gallstones, gallbladder wall thickening, or biliary dilatation. Pancreas: Unremarkable. No pancreatic ductal dilatation or surrounding inflammatory changes. Spleen: Normal in size without focal abnormality. Adrenals/Urinary Tract: Adrenal glands are unremarkable. Kidneys are normal, without renal calculi, focal lesion, or hydronephrosis. Mild, bilateral, nonspecific perinephric inflammatory fat stranding is seen. 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 Stomach/Bowel: A nasogastric tube is seen. Stomach is within  normal limits. Appendix appears normal. No evidence of bowel wall thickening, distention, or inflammatory changes. Noninflamed diverticula are seen throughout the sigmoid colon. Vascular/Lymphatic: Aortic atherosclerosis. No enlarged abdominal or pelvic lymph nodes. Reproductive: Prostate is unremarkable. Other: There is a 7.0 cm x 5.2 cm left inguinal hernia. This contains fat and  a large segment of proximal to mid sigmoid colon. No abdominopelvic ascites. Musculoskeletal: A small amount of soft tissue air seen within the subcutaneous fat of the anterior pelvic wall on the right. Multilevel degenerative changes seen throughout the lumbar spine. IMPRESSION: 1. Large left inguinal hernia containing fat and a large segment of proximal to mid sigmoid colon. 2. Mild to moderate severity bibasilar atelectasis with a small right pleural effusion. 3. 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 4. Sigmoid diverticulosis. 5. Aortic atherosclerosis. Aortic Atherosclerosis (ICD10-I70.0). Electronically Signed: By: Aram Candela M.D. On: 06/15/2021 00:43   ? ?Anti-infectives: ?Anti-infectives (From admission, onward)  ? ? Start     Dose/Rate Route Frequency Ordered Stop  ? 06/09/21 1400  piperacillin-tazobactam (ZOSYN) IVPB 3.375 g       ? 3.375 g ?12.5 mL/hr over 240 Minutes Intravenous Every 8 hours 06/09/21 1324 06/16/21 1359  ? ?  ? ? ? ?Assessment/Plan ?CVA with dysphagia ?Lap assisted PEG placement ?- surgery including risks and benefits has been discussed with family members at bedside yesterday afternoon and they wish to proceed with tube placement. Palliative care has been consulted for GOC discussion as well ?- continue to hold Tfs for possible placement today ? ?FEN - TF's held. NPO ?VTE - SCDs, lovenox ?ID - Zosyn for possible asp pna ? ?I reviewed last 24 h vitals and pain scores, last 48 h intake and output, and last 24 h labs and trends. ? ? LOS: 12 days  ? ?Eric Form, PA-C ?[facility] ?06/16/2021, 7:56 AM ?Please see Amion for pager number during day hours 7:00am-4:30pm ? ?

## 2021-06-16 NOTE — Anesthesia Postprocedure Evaluation (Signed)
Anesthesia Post Note ? ?Patient: Francisca Harbuck ? ?Procedure(s) Performed: LAPAROSCOPIC - ASSISTED PEG (Abdomen) ?PERCUTANEOUS ENDOSCOPIC GASTROSTOMY (PEG) PLACEMENT (Abdomen) ? ?  ? ?Patient location during evaluation: PACU ?Anesthesia Type: General ?Level of consciousness: awake and alert, oriented and patient cooperative ?Pain management: pain level controlled ?Vital Signs Assessment: post-procedure vital signs reviewed and stable ?Respiratory status: spontaneous breathing, nonlabored ventilation and respiratory function stable ?Cardiovascular status: blood pressure returned to baseline and stable ?Postop Assessment: no apparent nausea or vomiting ?Anesthetic complications: no ? ? ?No notable events documented. ? ?Last Vitals:  ?Vitals:  ? 06/16/21 1152 06/16/21 1515  ?BP: (!) 147/66 (!) 178/71  ?Pulse: 67 66  ?Resp: 16 15  ?Temp: 36.6 ?C (!) 36.1 ?C  ?SpO2: 95% 96%  ?  ?Last Pain:  ?Vitals:  ? 06/16/21 0737  ?TempSrc: Axillary  ?PainSc:   ? ? ?  ?  ?  ?  ?  ?  ? ?Tennis Must Tamie Minteer ? ? ? ? ?

## 2021-06-16 NOTE — Progress Notes (Signed)
?PROGRESS NOTE ? ? ? ?Louis Moore  [id number] DOB: 12/05/1950 DOA: 06/04/2021 ?PCP: Pcp, No  ? ? ?Brief Narrative:  ?Patient is a 71 years old male with no significant medical history but no follow-up with PCP, presented to hospital with numbness in his fingers on the right and had a slipped from his bed but did not fall.  His brother called EMS and patient was brought to the hospital for code stroke.  Patient had mild aphasia right upper extremity plegia and right lower extremity weakness and visual neglect.  CT head scan showed acute intraparenchymal hemorrhage around the posterior left frontal lobe with some subarachnoid hemorrhage and intraventricular extension.  There was some mild 3 mm midline shift.  Initial blood pressure was elevated in the 190s.  Patient was then initially admitted by the neurology team in the hospital in the ICU setting.  Patient was subsequently considered stable for transfer out of the ICU after stabilization. ? ?Assessment and plan. ?Principal Problem: ?  ICH (intracerebral hemorrhage) ([place]) ?Active Problems: ?  Protein-calorie malnutrition, severe ?  Paroxysmal atrial fibrillation (H[facility]) ?  Hypernatremia ?  Aspiration pneumonia ([place]) ?  ?Left high frontal parenchymal intracranial hemorrhage likely from uncontrolled hypertension with intraventricular extension and subarachnoid hemorrhage. With right hemiplegia ?Patient was not on any anticoagulation prior to this admission.   ?CT scan repeated showed a stable ICH with no hydrocephalus.   ?2D echocardiogram with LV ejection fraction of 60 to 65%.   ?LDL at 118, hemoglobin A1c of 5.6.   ?Patient has been started on  Lovenox for DVT prophylaxis.  ? Physical therapy has seen the patient and recommended CIR at this time. ? ?Fever with mild leukocytosis.    ? Chest x-ray with bibasilar airspace opacities and mild pleural effusion.     ?Patient received IV Zosyn per concerns of possible aspiration pneumonia.   Zosyn was initiated  06/09/2021.  Plan for total of 7-day course. ?Fever resolved , persistent leukocytosis ? ?Hypernatremia.    Received IV fluids.  Sodium normalized ? ?New-onset atrial fibrillation with RVR.   ?Cardiology consulted ?   On oral Cardizem  ( change from long acting to short acting as long acting can not be given per tube) ?continue amiodarone. Per tube   ?Currently controlled.  Heart rate of 55 ? ?Essential hypertension.   ?Elevated blood pressure on presentation.  Not on medications at home.   ?Bp stable on current regimen ? ? ?Hyperlipidemia.  LDL of 118.  Continue Crestor. ? ?Acute kidney injury.  Resolved.   ? ?Deconditioning, debility ?Will need rehabilitation. ? ?Agitation, delirium. ?Needed restraints and Haldol.   ? ?Nutrition.  Speech therapy reassessment on 06/14/2021 without any improvement.  On Cortrak tube feeding.  family have decided to proceed with PEG tube placement.  Consulted IR who could not perform PEG tube due to bowels overlying the stomach.  ?surgical PEG tube placement on 4/12 ? ?Large left inguinal hernia.  CT scan was performed overnight without any evidence of obstruction.   ?Seen by General surgery "not incarcerated. Able to be reduced & tolerating tf's w/o emesis + having bm's. No need for urgent intervention" ? ?Goals of care ?Patient will likely have a prolonged course and might benefit from palliative care discussion.   ?palliative care consulted placed on 4/11 ?  ? ?  DVT prophylaxis: enoxaparin (LOVENOX) injection 40 mg Start: 06/08/21 1200 ?SCD's Start: 06/04/21 0052 ? ?Code Status:   ?  Code Status: Full Code ? ?Disposition: CIR  as per PT recommendation. ? ?Status is: Inpatient ? ?Remains inpatient appropriate because: Intracranial hemorrhage, need for rehabilitation,  ? ? Family Communication: ?Spoke with the patient's sister/brother at bedside on 4/12. ? ?Consultants:  ?Neurology ?Cardiology ?Palliative care  ?Gen surg ? ?Procedures:  ?NG tube placement and removal ?Cortrak tube tube  placement and removal ?Peg placement on 4/12 ? ?Antimicrobials:  ?Zosyn IV 4/5>4/12 ? ?Anti-infectives (From admission, onward)  ? ? Start     Dose/Rate Route Frequency Ordered Stop  ? 06/09/21 1400  piperacillin-tazobactam (ZOSYN) IVPB 3.375 g       ? 3.375 g ?12.5 mL/hr over 240 Minutes Intravenous Every 8 hours 06/09/21 1324 06/16/21 1041  ? ?  ? ?Subjective: ?Today, patient was seen after returned from peg placement, denies pain, follow commands on left side, does not talk, family at bedside  ? ?Objective: ?Vitals:  ? 06/16/21 1515 06/16/21 1530 06/16/21 1545 06/16/21 1639  ?BP: (!) 178/71 (!) 179/66 (!) 156/53 (!) 139/55  ?Pulse: 66 61 (!) 56   ?Resp: 15 16 15 16   ?Temp: (!) 97 ?F (36.1 ?C)  (!) 97 ?F (36.1 ?C) 97.6 ?F (36.4 ?C)  ?TempSrc:    Axillary  ?SpO2: 96% 95% 93% 95%  ?Weight:      ? ? ?Intake/Output Summary (Last 24 hours) at 06/16/2021 1945 ?Last data filed at 06/16/2021 1505 ?Gross per 24 hour  ?Intake 800 ml  ?Output 410 ml  ?Net 390 ml  ? ?Filed Weights  ? 06/13/21 0400 06/14/21 0443 06/15/21 0439  ?Weight: 64.2 kg 65.2 kg 60.3 kg  ? ? ?Physical Examination: ? ?General:  Average built, not in obvious distress, , mumbling speech ?HENT:   No scleral pallor or icterus noted. Oral mucosa is moist.  ?Chest:    Diminished breath sounds bilaterally. No crackles or wheezes.  ?CVS: S1 &S2 heard. No murmur.  Regular rate and rhythm. ?Abdomen: s/p peg placement, abdominal binding in place ?Extremities: Right hemiplegia, moving left side  ?Psych: currently calm ,  no agitation ?CNS:  dysarthria, dysphagia ,  Right hemiplegia.  moving left side. ?Skin: Warm and dry.  No rashes noted. ? ?Data Reviewed:  ? ?CBC: ?Recent Labs  ?Lab 06/11/21 ?0427 06/12/21 ?0156 06/13/21 ?[id number] 06/14/21 ?0430 06/16/21 ?0410  ?WBC 16.4* 14.8* 13.1* 13.2* 13.9*  ?HGB 12.0* 11.7* 12.0* 11.7* 12.0*  ?HCT 37.5* 36.0* 36.3* 36.0* 34.8*  ?MCV 94.0 94.0 92.1 93.0 89.5  ?PLT 195 208 229 258 301  ? ? ?Basic Metabolic Panel: ?Recent Labs  ?Lab  06/11/21 ?[id number] 06/11/21 ?1711 06/12/21 ?0156 06/13/21 ?[id number] 06/14/21 ?0430 06/16/21 ?0410  ?NA 147* 146*  --  141 138 136  ?K 4.3 3.9  --  4.0 4.2 4.2  ?CL 119* 118*  --  113* 110 107  ?CO2 23 24  --  23 21* 23  ?GLUCOSE 208* 190*  --  167* 177* 104*  ?BUN 35* 31*  --  28* 27* 24*  ?CREATININE 1.19 1.00  --  0.94 0.97 0.89  ?CALCIUM 8.1* 8.3*  --  8.2* 8.1* 8.2*  ?MG 2.3  --  2.2 2.4 2.5* 2.5*  ?PHOS 3.0  --   --   --   --   --   ? ? ?Liver Function Tests: ?No results for input(s): AST, ALT, ALKPHOS, BILITOT, PROT, ALBUMIN in the last 168 hours. ? ? ? ?Radiology Studies: ?CT ABDOMEN PELVIS WO CONTRAST ? ?Addendum Date: 06/15/2021   ?ADDENDUM REPORT: 06/15/2021 02:58 ADDENDUM: Results were discussed with the  ordering physician at 1:45 a.m. [place] on December 15, 2021. Electronically Signed   By: Virgina Norfolk M.D.   On: 06/15/2021 02:58  ? ?Result Date: 06/15/2021 ?CLINICAL DATA:  Evaluate for left groin hernia. EXAM: CT ABDOMEN AND PELVIS WITHOUT CONTRAST TECHNIQUE: Multidetector CT imaging of the abdomen and pelvis was performed following the standard protocol without IV contrast. RADIATION DOSE REDUCTION: This exam was performed according to the departmental dose-optimization program which includes automated exposure control, adjustment of the mA and/or kV according to patient size and/or use of iterative reconstruction technique. COMPARISON:  None. FINDINGS: Lower chest: Mild to moderate severity atelectasis is seen within the bilateral lung bases. There is a small right pleural effusion. Hepatobiliary: No focal liver abnormality is seen. No gallstones, gallbladder wall thickening, or biliary dilatation. Pancreas: Unremarkable. No pancreatic ductal dilatation or surrounding inflammatory changes. Spleen: Normal in size without focal abnormality. Adrenals/Urinary Tract: Adrenal glands are unremarkable. Kidneys are normal, without renal calculi, focal lesion, or hydronephrosis. Mild, bilateral, nonspecific  perinephric inflammatory fat stranding is seen. 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 Stomach/Bowel: A nasogastri

## 2021-06-16 NOTE — Anesthesia Preprocedure Evaluation (Signed)
Anesthesia Evaluation  ?Patient identified by MRN, date of birth, ID band ?Patient awake ? ? ? ?Reviewed: ?Allergy & Precautions, NPO status , Patient's Chart, lab work & pertinent test results ? ?Airway ?Mallampati: II ? ?TM Distance: >3 FB ?Neck ROM: Full ? ? ? Dental ?no notable dental hx. ? ?  ?Pulmonary ?neg pulmonary ROS,  ?  ?Pulmonary exam normal ?breath sounds clear to auscultation ? ? ? ? ? ? Cardiovascular ?negative cardio ROS ?Normal cardiovascular exam ?Rhythm:Regular Rate:Normal ? ? ?  ?Neuro/Psych ?negative neurological ROS ? negative psych ROS  ? GI/Hepatic ?negative GI ROS, Neg liver ROS,   ?Endo/Other  ?negative endocrine ROS ? Renal/GU ?negative Renal ROS  ?negative genitourinary ?  ?Musculoskeletal ?negative musculoskeletal ROS ?(+)  ? Abdominal ?  ?Peds ?negative pediatric ROS ?(+)  Hematology ?negative hematology ROS ?(+)   ?Anesthesia Other Findings ?Dysphagia ? Reproductive/Obstetrics ?negative OB ROS ? ?  ? ? ? ? ? ? ? ? ? ? ? ? ? ?  ?  ? ? ? ? ? ? ? ? ?Anesthesia Physical ?Anesthesia Plan ? ?ASA: 3 ? ?Anesthesia Plan: General  ? ?Post-op Pain Management:   ? ?Induction: Intravenous ? ?PONV Risk Score and Plan: 2 and Ondansetron, Midazolam and Treatment may vary due to age or medical condition ? ?Airway Management Planned: Oral ETT ? ?Additional Equipment:  ? ?Intra-op Plan:  ? ?Post-operative Plan: Extubation in OR ? ?Informed Consent: I have reviewed the patients History and Physical, chart, labs and discussed the procedure including the risks, benefits and alternatives for the proposed anesthesia with the patient or authorized representative who has indicated his/her understanding and acceptance.  ? ? ? ?Dental advisory given ? ?Plan Discussed with: CRNA ? ?Anesthesia Plan Comments:   ? ? ? ? ? ? ?Anesthesia Quick Evaluation ? ?

## 2021-06-16 NOTE — Progress Notes (Signed)
PT Cancellation Note ? ?Patient Details ?Name: Louis Moore ?MRN: [id number] ?DOB: 06-18-1950 ? ? ?Cancelled Treatment:    Reason Eval/Treat Not Completed: Patient at procedure or test/unavailable.  Pt has be gone most of the afternoon for PEG placement.  Will hold today. ?06/16/2021 ? ?Jacinto Halim., PT ?[facility] ?[phone]  (pager) ?[phone]  (office) ? ? ?Eliseo Gum Ilyas Lipsitz ?06/16/2021, 3:32 PM ? ? ?

## 2021-06-16 NOTE — Op Note (Signed)
Date: 06/16/21 ? ?Patient: Louis Moore ?MRN: [id number] ? ?Preoperative Diagnosis: Dysphagia secondary to CVA ?Postoperative Diagnosis: Same ? ?Procedure: Laparoscopic-assisted percutaneous endoscopic gastrostomy tube placement ? ?Surgeon: Sophronia Simas, MD ?Assistant: Benna Dunks, MD (Resident); Carl Best, PA-C ? ?EBL: Minimal ? ?Anesthesia: General endotracheal ? ?Specimens: None ? ?Indications: [name] is a 71 yo male was admitted after a CVA, and now has aphasia and dysphagia. He has been receiving enteral nutrition via an NG feeding tube, and has had persistent dysphagia. General surgery was consulted for gastrostomy tube placement. ? ?Findings: 24-Fr G tube placed percutaneously into the body of the stomach using endoscopic and laparoscopic guidance. ? ?Procedure details: Informed consent was obtained in the preoperative area prior to the procedure. The patient was brought to the operating room and placed on the table in the supine position. General anesthesia was induced and appropriate lines and drains were placed for intraoperative monitoring. Perioperative antibiotics were administered per SCIP guidelines. The abdomen was prepped and draped in the usual sterile fashion. A pre-procedure timeout was taken verifying patient identity, surgical site and procedure to be performed. ? ?A small infraumbilical skin incision was made and the umbilical stalk was grasped and elevated. A Veress needle was inserted through the fascia and intraperitoneal placement was confirmed with the saline drop test. The abdomen was insufflated and a 67mm port was placed. The abdomen was visually inspected with no evidence of visceral or vascular injury. An EGD scope was then advanced through the oropharynx and into the esophagus. The esophagus was grossly normal in appearance, and the scope was advanced into the stomach. The stomach was fully insufflated with air. The pneumoperitoneal pressure was then decreased to to  allow the stomach to be directly apposed with the abdominal wall. A point on the abdominal wall in the LUQ was selected, which was directly over the body of the stomach. There was no bowel between the stomach and the abdominal wall. A small skin incision was made at this point, and a large bore needle with catheter was advanced through the abdominal wall into the stomach. The needle was removed, and a guidewire was advanced through the catheter into the stomach. The end of the guidewire was grasped with the endoscopic snare, and the the scope and guidewire were retracted and pulled out through the mouth. A 24-Fr feeding tube was then attached to the end of the guidewire, and the wire was then used to pull the feeding tube back down into the stomach. The feeding tube was pulled so that the bumper was flush with the wall of the stomach. The EGD was advanced into the stomach behind the feeding tube, and it was confirmed endoscopically that the bumper was against the gastric mucosa but loose enough to spin easily. On laparoscopic visualization, the stomach was against the abdominal wall. The tube was at 3cm at the skin. The air was evacuated from the stomach and the scope was withdrawn. The pneumoperitoneum was evacuated and the port was removed. The G tube was sutured to the skin with 2-0 Nylon suture. The skin at the port site was closed with subcuticular 4-0 monocryl suture. Dermabond was applied. ? ?The patient tolerated the procedure with no apparent complications. All counts were correct x2 at the end of the procedure. The patient was extubated and taken to PACU in stable condition. ? ?Sophronia Simas, MD ?06/16/21 ?3:03 PM ? ? ?

## 2021-06-17 ENCOUNTER — Encounter (HOSPITAL_COMMUNITY): Payer: Self-pay | Admitting: Surgery

## 2021-06-17 DIAGNOSIS — G8191 Hemiplegia, unspecified affecting right dominant side: Secondary | ICD-10-CM | POA: Diagnosis not present

## 2021-06-17 DIAGNOSIS — R1310 Dysphagia, unspecified: Secondary | ICD-10-CM | POA: Diagnosis not present

## 2021-06-17 DIAGNOSIS — R627 Adult failure to thrive: Secondary | ICD-10-CM | POA: Diagnosis not present

## 2021-06-17 DIAGNOSIS — I480 Paroxysmal atrial fibrillation: Secondary | ICD-10-CM | POA: Diagnosis not present

## 2021-06-17 DIAGNOSIS — Z515 Encounter for palliative care: Secondary | ICD-10-CM

## 2021-06-17 DIAGNOSIS — Z7189 Other specified counseling: Secondary | ICD-10-CM

## 2021-06-17 LAB — GLUCOSE, CAPILLARY
Glucose-Capillary: 104 mg/dL — ABNORMAL HIGH (ref 70–99)
Glucose-Capillary: 110 mg/dL — ABNORMAL HIGH (ref 70–99)
Glucose-Capillary: 110 mg/dL — ABNORMAL HIGH (ref 70–99)
Glucose-Capillary: 127 mg/dL — ABNORMAL HIGH (ref 70–99)
Glucose-Capillary: 128 mg/dL — ABNORMAL HIGH (ref 70–99)
Glucose-Capillary: 134 mg/dL — ABNORMAL HIGH (ref 70–99)
Glucose-Capillary: 170 mg/dL — ABNORMAL HIGH (ref 70–99)

## 2021-06-17 NOTE — Progress Notes (Signed)
? ?  Progress Note ? ?1 Day Post-Op  ?Subjective: ?NAEON. Alert and interactive this morning. Wants to get out of bed and sit in the chair. Shakes his head no when I ask if he is having any pain ? ?Objective: ?Vital signs in last 24 hours: ?Temp:  [97 ?F (36.1 ?C)-98.6 ?F (37 ?C)] 98.6 ?F (37 ?C) (04/13 0802) ?Pulse Rate:  [56-77] 77 (04/13 0802) ?Resp:  [15-19] 17 (04/13 0802) ?BP: (139-179)/(53-76) 158/76 (04/13 0802) ?SpO2:  [93 %-100 %] 100 % (04/13 0802) ?Weight:  [61.2 kg] 61.2 kg (04/13 0500) ?Last BM Date : 06/15/21 ? ?Intake/Output from previous day: ?04/12 0701 - 04/13 0700 ?In: 800 [I.V.:800] ?Out: 910 [Urine:900; Blood:10] ?Intake/Output this shift: ?No intake/output data recorded. ? ?PE: ?General: pleasant, WD, male who is laying in bed in NAD ?Lungs:Respiratory effort nonlabored ?Abd: soft, NT, ND, +BS, no masses, hernias, or organomegaly. G tube in place without erythema or bleeding at site - currently clamped but connected to gravity drainage with clear fluid in bag. Umbilical incision c/d/I with surgical glue. Left inguinal hernia noted and appears to be fully reducible ?MSK: all 4 extremities are symmetrical with no cyanosis, clubbing, or edema. ?Skin: warm and dry with no masses, lesions, or rashes ?Psych: alert, oriented to self ? ? ?Lab Results:  ?Recent Labs  ?  06/16/21 ?0410  ?WBC 13.9*  ?HGB 12.0*  ?HCT 34.8*  ?PLT 301  ? ? ?BMET ?Recent Labs  ?  06/16/21 ?0410  ?NA 136  ?K 4.2  ?CL 107  ?CO2 23  ?GLUCOSE 104*  ?BUN 24*  ?CREATININE 0.89  ?CALCIUM 8.2*  ? ? ?PT/INR ?No results for input(s): LABPROT, INR in the last 72 hours. ?CMP  ?   ?Component Value Date/Time  ? NA 136 06/16/2021 0410  ? K 4.2 06/16/2021 0410  ? CL 107 06/16/2021 0410  ? CO2 23 06/16/2021 0410  ? GLUCOSE 104 (H) 06/16/2021 0410  ? BUN 24 (H) 06/16/2021 0410  ? CREATININE 0.89 06/16/2021 0410  ? CALCIUM 8.2 (L) 06/16/2021 0410  ? PROT 6.4 (L) 06/04/2021 0028  ? ALBUMIN 3.6 06/04/2021 0028  ? AST 19 06/04/2021 0028  ? ALT 13  06/04/2021 0028  ? ALKPHOS 56 06/04/2021 0028  ? BILITOT 0.5 06/04/2021 0028  ? GFRNONAA >60 06/16/2021 0410  ? ?Lipase  ?No results found for: LIPASE ? ? ? ? ?Studies/Results: ?No results found. ? ?Anti-infectives: ?Anti-infectives (From admission, onward)  ? ? Start     Dose/Rate Route Frequency Ordered Stop  ? 06/09/21 1400  piperacillin-tazobactam (ZOSYN) IVPB 3.375 g       ? 3.375 g ?12.5 mL/hr over 240 Minutes Intravenous Every 8 hours 06/09/21 1324 06/16/21 1112  ? ?  ? ? ? ?Assessment/Plan ?CVA with dysphagia ?POD1 s/p Lap assisted PEG placement Dr. Zenia Resides 4/12 ?- continue abdominal binder over site. Continue flushes ?- can start tube feeds today ? ?FEN - resume TFs ?VTE - SCDs, lovenox ?ID - Zosyn for asp pna ? ?I reviewed last 24 h vitals and pain scores, last 48 h intake and output, and last 24 h labs and trends. ? ? LOS: 13 days  ? ?Winferd Humphrey, PA-C ?[facility] ?06/17/2021, 9:16 AM ?Please see Amion for pager number during day hours 7:00am-4:30pm ? ?

## 2021-06-17 NOTE — [place] FL2 (Addendum)
?[place] MEDICAID FL2 LEVEL OF CARE SCREENING TOOL  ?  ? ?IDENTIFICATION  ?Patient Name: ?Louis Moore Birthdate: 1950/04/07 Sex: male Admission Date (Current Location): ?06/04/2021  ?[place] Number: ? [facility] ?  Facility and Address:  ?[facility], [address], [place], [place] [address] ?     Provider Number: ?[id number]  ?Attending Physician Name and Address:  ?Albertine Grates, MD ? Relative Name and Phone Number:  ?  ?   ?Current Level of Care: ?Hospital Recommended Level of Care: ?Skilled Nursing Facility Prior Approval Number: ?  ? ?Date Approved/Denied: ?  PASRR Number: ? [id number] A ? ?Discharge Plan: ?SNF ?  ? ?Current Diagnoses: ?Patient Active Problem List  ? Diagnosis Date Noted  ? Hypernatremia 06/10/2021  ? Aspiration pneumonia (HCC) 06/10/2021  ? Paroxysmal atrial fibrillation (HCC)   ? Protein-calorie malnutrition, severe 06/08/2021  ? ICH (intracerebral hemorrhage) (HCC) 06/04/2021  ? ? ?Orientation RESPIRATION BLADDER Height & Weight   ?  ?Self ? Normal Incontinent Weight: 134 lb 14.7 oz (61.2 kg) ?Height:     ?BEHAVIORAL SYMPTOMS/MOOD NEUROLOGICAL BOWEL NUTRITION STATUS  ?    Incontinent Feeding tube (Osmolite 1.5 @ 50 mL/hr)  ?AMBULATORY STATUS COMMUNICATION OF NEEDS Skin   ?Extensive Assist Verbally Surgical wounds ?  ?  ?  ?    ?     ?     ? ? ?Personal Care Assistance Level of Assistance  ?Bathing, Feeding, Dressing Bathing Assistance: Maximum assistance ?Feeding assistance: Maximum assistance ?Dressing Assistance: Maximum assistance ?   ? ?Functional Limitations Info  ?Speech   ?  ?Speech Info: Impaired (incomprehensible)  ? ? ?SPECIAL CARE FACTORS FREQUENCY  ?PT (By licensed PT), OT (By licensed OT), Speech therapy   ?  ?PT Frequency: 5x/week ?OT Frequency: 5x/week ?  ?  ?Speech Therapy Frequency: 5x/wk ?   ? ? ?Contractures Contractures Info: Not present  ? ? ?Additional Factors Info  ?Code Status, Allergies Code Status Info: Full ?Allergies Info: NKA ?   ?  ?  ?   ? ?Current Medications (06/17/2021):  This is the current hospital active medication list ?Current Facility-Administered Medications  ?Medication Dose Route Frequency Provider Last Rate Last Admin  ? acetaminophen (TYLENOL) 160 MG/5ML solution 650 mg  650 mg Per Tube Q4H PRN Jacinto Halim, PA-C   650 mg at 06/13/21 2144  ? Or  ? acetaminophen (TYLENOL) suppository 650 mg  650 mg Rectal Q4H PRN Maczis, Elmer Sow, PA-C      ? amiodarone (PACERONE) tablet 400 mg  400 mg Per Tube BID Jacinto Halim, PA-C   400 mg at 06/17/21 1000  ? Followed by  ? [START ON 06/19/2021] amiodarone (PACERONE) tablet 200 mg  200 mg Per Tube Daily Maczis, Elmer Sow, PA-C      ? bethanechol (URECHOLINE) tablet 10 mg  10 mg Per Tube TID Jacinto Halim, PA-C   10 mg at 06/17/21 1000  ? Chlorhexidine Gluconate Cloth 2 % PADS 6 each  6 each Topical Daily Jacinto Halim, PA-C   6 each at 06/17/21 1000  ? diltiazem (CARDIZEM) tablet 30 mg  30 mg Oral Q6H Albertine Grates, MD   30 mg at 06/17/21 1137  ? enoxaparin (LOVENOX) injection 40 mg  40 mg Subcutaneous Q24H Jacinto Halim, PA-C   40 mg at 06/17/21 1137  ? feeding supplement (OSMOLITE 1.5 CAL) liquid 1,000 mL  1,000 mL Per Tube Continuous Jacinto Halim, PA-C 50 mL/hr  at 06/17/21 1138 1,000 mL at 06/17/21 1138  ? feeding supplement (PROSource TF) liquid 90 mL  90 mL Per Tube BID Jacinto Halim, PA-C   90 mL at 06/17/21 1001  ? food thickener (SIMPLYTHICK (NECTAR/LEVEL 2/MILDLY THICK)) 10 packet  10 packet Oral PRN Jacinto Halim, PA-C      ? free water 200 mL  200 mL Per Tube Q4H Jacinto Halim, PA-C   200 mL at 06/17/21 1137  ? haloperidol lactate (HALDOL) injection 2 mg  2 mg Intravenous Q6H PRN Jacinto Halim, PA-C      ? hydrALAZINE (APRESOLINE) injection 10 mg  10 mg Intravenous Q4H PRN Jacinto Halim, PA-C   10 mg at 06/08/21 1103  ? labetalol (NORMODYNE) injection 10 mg  10 mg Intravenous Q2H PRN Jacinto Halim, PA-C   10 mg at 06/11/21 0049  ? lip  balm (CARMEX) ointment   Topical PRN Maczis, Elmer Sow, PA-C      ? mupirocin ointment (BACTROBAN) 2 % 1 application.  1 application. Nasal BID Jacinto Halim, PA-C   1 application. at 06/17/21 1001  ? pantoprazole sodium (PROTONIX) 40 mg/20 mL oral suspension 40 mg  40 mg Per Tube QHS Jacinto Halim, PA-C   40 mg at 06/16/21 2109  ? thiamine tablet 100 mg  100 mg Per Tube Daily Jacinto Halim, PA-C   100 mg at 06/17/21 1000  ? ? ? ?Discharge Medications: ?Please see discharge summary for a list of discharge medications. ? ?Relevant Imaging Results: ? ?Relevant Lab Results: ? ? ?Additional Information ? SS#: [id number] ? ?Baldemar Lenis, LCSW ? ? ? ? ?

## 2021-06-17 NOTE — Progress Notes (Signed)
Physical Therapy Treatment ?Patient Details ?Name: Louis Moore ?MRN: [id number] ?DOB: Jun 03, 1950 ?Today's Date: 06/17/2021 ? ? ?History of Present Illness Patient is a 71 y/o male admitted with R hemiplegia and numbness found to have high L frontal ICH w/ mild cytotoxic edema, trace IV extension and 50mm midline shift. Stated on hypertonic saline, cleviprex.  No PMH as has not seen PCP in decades. ? ?  ?PT Comments  ? ? Pt more alert today, still with expressive difficulties and with noticeable inattention to the R.  Emphasis on transitions to EOB, scooting, sitting balance with additional challenge of completing a basic ADL, sit to stand and transfer bed chair.  Pt unable to remain focused on task needing frequent redirecting cues. ?   ?Recommendations for follow up therapy are one component of a multi-disciplinary discharge planning process, led by the attending physician.  Recommendations may be updated based on patient status, additional functional criteria and insurance authorization. ? ?Follow Up Recommendations ? Skilled nursing-short term rehab (<3 hours/day) ?  ?  ?Assistance Recommended at Discharge Frequent or constant Supervision/Assistance  ?Patient can return home with the following Two people to help with walking and/or transfers;Two people to help with bathing/dressing/bathroom;Assistance with cooking/housework;Direct supervision/assist for medications management;Assist for transportation;Help with stairs or ramp for entrance;Direct supervision/assist for financial management;Assistance with feeding ?  ?Equipment Recommendations ? Other (comment) (TBA)  ?  ?Recommendations for Other Services   ? ? ?  ?Precautions / Restrictions Precautions ?Precautions: Fall ?Precaution Comments: R neglect, hemiparesis, new PEG  ?  ? ?Mobility ? Bed Mobility ?Overal bed mobility: Needs Assistance ?Bed Mobility: Supine to Sit ?  ?  ?Supine to sit: Mod assist, +2 for physical assistance ?  ?  ?General bed mobility  comments: pt needed initiation assist and cues to redirect to task.  Assisted LE's off side of the bed and assisted trunk ap and forward helping hip pivot to square up EOB ?  ? ?Transfers ?Overall transfer level: Needs assistance ?  ?Transfers: Sit to/from Stand, Bed to chair/wheelchair/BSC ?Sit to Stand: Max assist, +2 physical assistance ?Stand pivot transfers: Max assist, +2 physical assistance ?  ?  ?  ?  ?General transfer comment: face to face assist to stand and pivot. ?  ? ?Ambulation/Gait ?  ?  ?  ?  ?  ?  ?  ?  ? ? ?Stairs ?  ?  ?  ?  ?  ? ? ?Wheelchair Mobility ?  ? ?Modified Rankin (Stroke Patients Only) ?  ? ? ?  ?Balance   ?Sitting-balance support: Feet supported ?Sitting balance-Leahy Scale: Poor ?Sitting balance - Comments: Still pushing L to R most of his time at EOB, but with episodes of upright posture head in midline. ?  ?Standing balance support: Bilateral upper extremity supported ?Standing balance-Leahy Scale: Zero ?  ?  ?  ?  ?  ?  ?  ?  ?  ?  ?  ?  ?  ? ?  ?Cognition Arousal/Alertness: Awake/alert ?Behavior During Therapy: Restless ?Overall Cognitive Status: Difficult to assess ?  ?  ?  ?  ?  ?  ?  ?  ?  ?  ?  ?  ?  ?  ?  ?  ?  ?  ?  ? ?  ?Exercises   ? ?  ?General Comments General comments (skin integrity, edema, etc.): While sitting EOB or in the chair, pt used a mouth swab and wash cloth appropriately after command and a  few cues. ?  ?  ? ?Pertinent Vitals/Pain Pain Assessment ?Pain Assessment: Faces ?Faces Pain Scale: No hurt ?Pain Intervention(s): Monitored during session  ? ? ?Home Living   ?  ?  ?  ?  ?  ?  ?  ?  ?  ?   ?  ?Prior Function    ?  ?  ?   ? ?PT Goals (current goals can now be found in the care plan section) Acute Rehab PT Goals ?PT Goal Formulation: With family ?Time For Goal Achievement: 06/20/21 ?Potential to Achieve Goals: Fair ?Progress towards PT goals: Progressing toward goals ? ?  ?Frequency ? ? ? Min 3X/week ? ? ? ?  ?PT Plan Discharge plan needs to be  updated;Frequency needs to be updated  ? ? ?Co-evaluation PT/OT/SLP Co-Evaluation/Treatment: Yes ?Reason for Co-Treatment: For patient/therapist safety ?PT goals addressed during session: Mobility/safety with mobility ?  ?  ? ?  ?AM-PAC PT "6 Clicks" Mobility   ?Outcome Measure ? Help needed turning from your back to your side while in a flat bed without using bedrails?: Total ?Help needed moving from lying on your back to sitting on the side of a flat bed without using bedrails?: Total ?Help needed moving to and from a bed to a chair (including a wheelchair)?: Total ?Help needed standing up from a chair using your arms (e.g., wheelchair or bedside chair)?: Total ?Help needed to walk in hospital room?: Total ?Help needed climbing 3-5 steps with a railing? : Total ?6 Click Score: 6 ? ?  ?End of Session   ?Activity Tolerance: Patient tolerated treatment well;Patient limited by fatigue ?Patient left: in chair;with call bell/phone within reach;with chair alarm set ?Nurse Communication: Mobility status ?PT Visit Diagnosis: Other abnormalities of gait and mobility (R26.89);Hemiplegia and hemiparesis;Other symptoms and signs involving the nervous system (R29.898) ?Hemiplegia - Right/Left: Right ?Hemiplegia - dominant/non-dominant: Dominant ?Hemiplegia - caused by: Nontraumatic intracerebral hemorrhage ?  ? ? ?Time: [id number] ?PT Time Calculation (min) (ACUTE ONLY): 32 min ? ?Charges:  $Neuromuscular Re-education: 8-22 mins          ?          ? ?06/17/2021 ? ?Ginger Carne., PT ?Acute Rehabilitation Services ?[phone]  (pager) ?[phone]  (office) ? ? ?[name] ?06/17/2021, 5:07 PM ? ?

## 2021-06-17 NOTE — Progress Notes (Signed)
?PROGRESS NOTE ? ? ? ?Louis Moore  [id number] DOB: [birth year] DOA: 06/04/2021 ?PCP: Pcp, No  ? ? ?Brief Narrative:  ?Patient is a 71 years old male with no significant medical history but no follow-up with PCP, presented to hospital with numbness in his fingers on the right and had a slipped from his bed but did not fall.  His brother called EMS and patient was brought to the hospital for code stroke.  Patient had mild aphasia right upper extremity plegia and right lower extremity weakness and visual neglect.  CT head scan showed acute intraparenchymal hemorrhage around the posterior left frontal lobe with some subarachnoid hemorrhage and intraventricular extension.  There was some mild 3 mm midline shift.  Initial blood pressure was elevated in the 190s.  Patient was then initially admitted by the neurology team in the hospital in the ICU setting.  Patient was subsequently considered stable for transfer out of the ICU after stabilization. ? ?Assessment and plan. ?Principal Problem: ?  ICH (intracerebral hemorrhage) ([place]) ?Active Problems: ?  Protein-calorie malnutrition, severe ?  Paroxysmal atrial fibrillation (HCC) ?  Hypernatremia ?  Aspiration pneumonia ([place]) ?  ?Left high frontal parenchymal intracranial hemorrhage likely from uncontrolled hypertension with intraventricular extension and subarachnoid hemorrhage. With right hemiplegia ?Patient was not on any anticoagulation prior to this admission.   ?CT scan repeated showed a stable ICH with no hydrocephalus.   ?2D echocardiogram with LV ejection fraction of 60 to 65%.   ?LDL at 118, hemoglobin A1c of 5.6.   ?Patient has been started on  Lovenox for DVT prophylaxis.  ? Physical therapy has seen the patient and recommended CIR at this time. ? ?Fever with mild leukocytosis.    ? Chest x-ray with bibasilar airspace opacities and mild pleural effusion.     ?Patient received IV Zosyn per concerns of possible aspiration pneumonia.   Zosyn was initiated  06/09/2021.  Plan for total of 7-day course. ?Fever resolved , persistent leukocytosis ? ?Hypernatremia.    Received IV fluids.  Sodium normalized ? ?New-onset atrial fibrillation with RVR.   ?Cardiology consulted ?   On oral Cardizem  ( change from long acting to short acting as long acting can not be given per tube) ?continue amiodarone. Per tube   ?Currently controlled.  Heart rate of 55 ? ?Essential hypertension.   ?Elevated blood pressure on presentation.  Not on medications at home.   ?Bp stable on current regimen ? ? ?Hyperlipidemia.  LDL of 118.  Continue Crestor. ? ?Acute kidney injury.  Resolved.   ? ?Deconditioning, debility ?Will need rehabilitation. ? ?Agitation, delirium. ?Needed restraints and Haldol.   ? ?Nutrition.  Speech therapy reassessment on 06/14/2021 without any improvement.  On Cortrak tube feeding.  family have decided to proceed with PEG tube placement.  Consulted IR who could not perform PEG tube due to bowels overlying the stomach.  ?surgical PEG tube placement on 4/12 ? ?Large left inguinal hernia.  CT scan was performed overnight without any evidence of obstruction.   ?Seen by General surgery "not incarcerated. Able to be reduced & tolerating tf's w/o emesis + having bm's. No need for urgent intervention" ? ?Goals of care ?Patient will likely have a prolonged course and might benefit from palliative care discussion.   ?palliative care consulted placed on 4/11 ? ?Gen surg oked to start tube feeds , nothing by mouth ?Diper rash could contribute to mild leukocytosis, getting topical antifungal and being turned ?For head lesion , appear itchy, topical steroids,  dermatology referral if family desires ?  ? ?  DVT prophylaxis: enoxaparin (LOVENOX) injection 40 mg Start: 06/08/21 1200 ?SCD's Start: 06/04/21 0052 ? ?Code Status:   ?  Code Status: Full Code ? ?Disposition: CIR as per PT recommendation. ? ?Status is: Inpatient ? ?Remains inpatient appropriate because: Intracranial hemorrhage, need  for rehabilitation,  ? ? Family Communication: ?Spoke with the patient's sister/brother at bedside on 4/12. ? ?Consultants:  ?Neurology ?Cardiology ?Palliative care  ?Gen surg ? ?Procedures:  ?NG tube placement and removal ?Cortrak tube tube placement and removal ?Peg placement on 4/12 ? ?Antimicrobials:  ?Zosyn IV 4/5>4/12 ? ?Anti-infectives (From admission, onward)  ? ? Start     Dose/Rate Route Frequency Ordered Stop  ? 06/09/21 1400  piperacillin-tazobactam (ZOSYN) IVPB 3.375 g       ? 3.375 g ?12.5 mL/hr over 240 Minutes Intravenous Every 8 hours 06/09/21 1324 06/16/21 1112  ? ?  ? ?Subjective: ? He follows commands, has dysarthria, there is no agitation ?He denies pain ?Continue to have dense right hemiplegia  ?  ? ?Objective: ?Vitals:  ? 06/16/21 2224 06/17/21 0410 06/17/21 0500 06/17/21 0802  ?BP: (!) 148/71 (!) 154/75  (!) 158/76  ?Pulse: 73 74  77  ?Resp: 19 18  17   ?Temp: 98.3 ?F (36.8 ?C)   98.6 ?F (37 ?C)  ?TempSrc: Oral   Axillary  ?SpO2: 97% 97%  100%  ?Weight:   61.2 kg   ? ? ?Intake/Output Summary (Last 24 hours) at 06/17/2021 1126 ?Last data filed at 06/17/2021 1008 ?Gross per 24 hour  ?Intake 870 ml  ?Output 1085 ml  ?Net -215 ml  ? ?Filed Weights  ? 06/14/21 0443 06/15/21 0439 06/17/21 0500  ?Weight: 65.2 kg 60.3 kg 61.2 kg  ? ? ?Physical Examination: ? ?General:  Average built, not in obvious distress, , mumbling speech ?HENT:   No scleral pallor or icterus noted. Oral mucosa is moist.  ?Chest:    Diminished breath sounds bilaterally. No crackles or wheezes.  ?CVS: S1 &S2 heard. No murmur.  Regular rate and rhythm. ?Abdomen: s/p peg placement, abdominal binding in place ?Extremities: Right hemiplegia, moving left side  ?Psych: currently calm ,  no agitation ?CNS:  dysarthria, dysphagia ,  Right hemiplegia.  moving left side. ?Skin: Warm and dry.  No rashes noted. ? ?Data Reviewed:  ? ?CBC: ?Recent Labs  ?Lab 06/11/21 ?0427 06/12/21 ?0156 06/13/21 ?[id number] 06/14/21 ?0430 06/16/21 ?0410  ?WBC 16.4*  14.8* 13.1* 13.2* 13.9*  ?HGB 12.0* 11.7* 12.0* 11.7* 12.0*  ?HCT 37.5* 36.0* 36.3* 36.0* 34.8*  ?MCV 94.0 94.0 92.1 93.0 89.5  ?PLT 195 208 229 258 301  ? ? ?Basic Metabolic Panel: ?Recent Labs  ?Lab 06/11/21 ?[id number] 06/11/21 ?1711 06/12/21 ?0156 06/13/21 ?[id number] 06/14/21 ?0430 06/16/21 ?0410  ?NA 147* 146*  --  141 138 136  ?K 4.3 3.9  --  4.0 4.2 4.2  ?CL 119* 118*  --  113* 110 107  ?CO2 23 24  --  23 21* 23  ?GLUCOSE 208* 190*  --  167* 177* 104*  ?BUN 35* 31*  --  28* 27* 24*  ?CREATININE 1.19 1.00  --  0.94 0.97 0.89  ?CALCIUM 8.1* 8.3*  --  8.2* 8.1* 8.2*  ?MG 2.3  --  2.2 2.4 2.5* 2.5*  ?PHOS 3.0  --   --   --   --   --   ? ? ?Liver Function Tests: ?No results for input(s): AST, ALT, ALKPHOS, BILITOT, PROT, ALBUMIN  in the last 168 hours. ? ? ? ?Radiology Studies: ?No results found. ? ? ? LOS: 13 days  ? ? ?Florencia Reasons, MD PhD FACP ?[facility] ?06/17/2021, 11:26 AM  ? ? ?

## 2021-06-17 NOTE — Progress Notes (Signed)
Occupational Therapy Treatment ?Patient Details ?Name: Louis Moore ?MRN: [id number] ?DOB: 11/29/1950 ?Today's Date: 06/17/2021 ? ? ?History of present illness Patient is a 71 y/o male admitted with R hemiplegia and numbness found to have high L frontal ICH w/ mild cytotoxic edema, trace IV extension and 24mm midline shift. Stated on hypertonic saline, cleviprex.  No PMH as has not seen PCP in decades. ?  ?OT comments ? Pt more alert this session and able to follow 75% of simple commands. He sat EOB for 10 mins, working on sitting balance and oral hygiene. Pt having difficulty ending tasks, however initiating well. Continues to need Max A +2 to transfer, as pt is unable to maintain balance on his own. Continuing to recommend SNF to address concerns listed below. OT will continue to follow.   ? ?Recommendations for follow up therapy are one component of a multi-disciplinary discharge planning process, led by the attending physician.  Recommendations may be updated based on patient status, additional functional criteria and insurance authorization. ?   ?Follow Up Recommendations ? Skilled nursing-short term rehab (<3 hours/day)  ?  ?Assistance Recommended at Discharge Frequent or constant Supervision/Assistance  ?Patient can return home with the following ? Two people to help with walking and/or transfers;A lot of help with bathing/dressing/bathroom;Assistance with feeding;Direct supervision/assist for medications management;Direct supervision/assist for financial management;Assist for transportation;Help with stairs or ramp for entrance ?  ?Equipment Recommendations ? BSC/3in1;Wheelchair (measurements OT);Wheelchair cushion (measurements OT);Hospital bed  ?  ?Recommendations for Other Services   ? ?  ?Precautions / Restrictions Precautions ?Precautions: Fall ?Precaution Comments: R neglect, hemiparesis, new PEG ?Restrictions ?Weight Bearing Restrictions: No  ? ? ?  ? ?Mobility Bed Mobility ?Overal bed mobility: Needs  Assistance ?Bed Mobility: Supine to Sit ?  ?  ?Supine to sit: Mod assist, +2 for physical assistance ?  ?  ?General bed mobility comments: pt needed initiation assist and cues to redirect to task.  Assisted LE's off side of the bed and assisted trunk ap and forward helping hip pivot to square up EOB ?  ? ?Transfers ?Overall transfer level: Needs assistance ?  ?Transfers: Sit to/from Stand, Bed to chair/wheelchair/BSC ?Sit to Stand: Max assist, +2 physical assistance ?Stand pivot transfers: Max assist, +2 physical assistance ?  ?  ?  ?  ?General transfer comment: face to face assist to stand and pivot. ?  ?  ?Balance Overall balance assessment: Needs assistance ?Sitting-balance support: Feet supported ?Sitting balance-Leahy Scale: Poor ?Sitting balance - Comments: Still pushing L to R most of his time at EOB, but with episodes of upright posture head in midline. ?  ?Standing balance support: Bilateral upper extremity supported ?Standing balance-Leahy Scale: Zero ?  ?  ?  ?  ?  ?  ?  ?  ?  ?  ?  ?  ?   ? ?ADL either performed or assessed with clinical judgement  ? ?ADL Overall ADL's : Needs assistance/impaired ?  ?  ?Grooming: Oral care;Wash/dry hands;Wash/dry face;Minimal assistance;Sitting ?Grooming Details (indicate cue type and reason): Pt has difficulty ending task, requires objects being taken out of his hand to stop task ?  ?  ?  ?  ?  ?  ?  ?  ?Toilet Transfer: Maximal assistance;+2 for physical assistance;+2 for safety/equipment ?Toilet Transfer Details (indicate cue type and reason): simulated to recliner ?  ?  ?  ?  ?  ?General ADL Comments: Pt following increased commands and attempting to participate in conversations, continuing to require assist ?  ? ?  Extremity/Trunk Assessment   ?  ?  ?  ?  ?  ? ?Vision   ?  ?  ?Perception   ?  ?Praxis   ?  ? ?Cognition Arousal/Alertness: Awake/alert ?Behavior During Therapy: Restless ?Overall Cognitive Status: Difficult to assess ?  ?  ?  ?  ?  ?  ?  ?  ?  ?  ?  ?  ?   ?  ?  ?  ?  ?  ?  ?   ?Exercises   ? ?  ?Shoulder Instructions   ? ? ?  ?General Comments VSS on RA  ? ? ?Pertinent Vitals/ Pain       Pain Assessment ?Pain Assessment: Faces ?Faces Pain Scale: No hurt ?Pain Intervention(s): Monitored during session ? ?Home Living   ?  ?  ?  ?  ?  ?  ?  ?  ?  ?  ?  ?  ?  ?  ?  ?  ?  ?  ? ?  ?Prior Functioning/Environment    ?  ?  ?  ?   ? ?Frequency ? Min 2X/week  ? ? ? ? ?  ?Progress Toward Goals ? ?OT Goals(current goals can now be found in the care plan section) ? Progress towards OT goals: Progressing toward goals ? ?Acute Rehab OT Goals ?Patient Stated Goal: none stated ?OT Goal Formulation: Patient unable to participate in goal setting ?Time For Goal Achievement: 06/21/21 ?Potential to Achieve Goals: Fair ?ADL Goals ?Pt Will Perform Eating: with set-up;with adaptive utensils;sitting ?Pt Will Perform Grooming: with supervision;sitting ?Pt Will Perform Upper Body Dressing: sitting;with min guard assist ?Pt Will Perform Lower Body Dressing: with mod assist;with caregiver independent in assisting;sitting/lateral leans ?Pt Will Transfer to Toilet: with min assist;stand pivot transfer;bedside commode ?Pt Will Perform Toileting - Clothing Manipulation and hygiene: with mod assist;sit to/from stand ?Additional ADL Goal #1: Pt will attend to his right side of his body for ADL 50% of the time  ?Plan Discharge plan remains appropriate   ? ?Co-evaluation ? ? ? PT/OT/SLP Co-Evaluation/Treatment: Yes ?Reason for Co-Treatment: For patient/therapist safety;To address functional/ADL transfers ?PT goals addressed during session: Mobility/safety with mobility ?OT goals addressed during session: ADL's and self-care;Strengthening/ROM ?  ? ?  ?AM-PAC OT "6 Clicks" Daily Activity     ?Outcome Measure ? ? Help from another person eating meals?: Total ?Help from another person taking care of personal grooming?: A Lot ?Help from another person toileting, which includes using toliet, bedpan, or  urinal?: Total ?Help from another person bathing (including washing, rinsing, drying)?: A Lot ?Help from another person to put on and taking off regular upper body clothing?: A Lot ?Help from another person to put on and taking off regular lower body clothing?: Total ?6 Click Score: 9 ? ?  ?End of Session Equipment Utilized During Treatment: Gait belt ? ?OT Visit Diagnosis: Unsteadiness on feet (R26.81);Other abnormalities of gait and mobility (R26.89);Muscle weakness (generalized) (M62.81);Other symptoms and signs involving the nervous system (R29.898);Other symptoms and signs involving cognitive function;Hemiplegia and hemiparesis ?Hemiplegia - Right/Left: Right ?Hemiplegia - dominant/non-dominant: Dominant ?Hemiplegia - caused by: Nontraumatic SAH ?  ?Activity Tolerance Patient tolerated treatment well ?  ?Patient Left in chair;with call bell/phone within reach;with chair alarm set ?  ?Nurse Communication Mobility status ?  ? ?   ? ?Time: 4496-7591 ?OT Time Calculation (min): 32 min ? ?Charges: OT General Charges ?$OT Visit: 1 Visit ?OT Treatments ?$Self Care/Home Management : 8-22 mins ? ?  Shuna Tabor H., OTR/L ?Acute Rehabilitation ? ?Tarrence Enck Elane Bing [facility] ?06/17/2021, 5:56 PM ?

## 2021-06-17 NOTE — TOC Initial Note (Addendum)
Transition of Care (TOC) - Initial/Assessment Note  ? ? ?Patient Details  ?Name: Louis Moore ?MRN: [id number] ?Date of Birth: 02/06/1951 ? ?[facility]) CM/SW Contact:    ?Geralynn Ochs, LCSW ?Phone Number: ?06/17/2021, 3:31 PM ? ?Clinical Narrative:         CSW attempting to complete SNF workup, but patient has no social security number on file. CSW attempted to reach sister, left a voicemail and awaiting call back. CSW spoke with patient's brother, Louis Moore, and explained need for SNF placement. Darrell in agreement. Darrell doesn't know the patient's social security number but will search for his card and try to bring it today when he comes to the hospital. [place] faxed out referral, but unable to obtain [facility] without the patient's social security number. CSW to follow.      ? ?UPDATE 4:30 PM: CSW met with patient's family at bedside to discuss SNF placement and need for social security number. Family provided SS#, and discussed preference for [facility] or [facility]. CSW to follow up on referrals for bed offers.   ? ? ?Expected Discharge Plan: [place] ?Barriers to Discharge: Continued Medical Work up ? ? ?Patient Goals and CMS Choice ?Patient states their goals for this hospitalization and ongoing recovery are:: patient unable to participate in goal setting, not oriented ?CMS Medicare.gov Compare Post Acute Care list provided to:: Patient Represenative (must comment) ?Choice offered to / list presented to : Sibling ? ?Expected Discharge Plan and Services ?Expected Discharge Plan: [place] ?  ?  ?Post Acute Care Choice: [place] ?Living arrangements for the past 2 months: [place] ?                ?  ?  ?  ?  ?  ?  ?  ?  ?  ?  ? ?Prior Living Arrangements/Services ?Living arrangements for the past 2 months: [place] ?Lives with:: Siblings ?Patient language and need for interpreter reviewed:: No ?Do you feel safe  going back to the place where you live?: Yes      ?Need for Family Participation in Patient Care: Yes (Comment) ?Care giver support system in place?: No (comment) ?  ?Criminal Activity/Legal Involvement Pertinent to Current Situation/Hospitalization: No - Comment as needed ? ?Activities of Daily Living ?  ?  ? ?Permission Sought/Granted ?Permission sought to share information with : Facility Sport and exercise psychologist, Family Supports ?Permission granted to share information with : Yes, Verbal Permission Granted ? Share Information with NAME: Hurs[place] Party ? Permission granted to share info w AGENCY: SNF ? Permission granted to share info w Relationship: Siblings ?   ? ?Emotional Assessment ?Appearance:: Appears stated age ?Attitude/Demeanor/Rapport: Unable to Assess ?Affect (typically observed): Unable to Assess ?Orientation: : Oriented to Self, Oriented to Place ?Alcohol / Substance Use: Not Applicable ?Psych Involvement: No (comment) ? ?Admission diagnosis:  Hemorrhagic stroke ([place]) [I61.9] ?ICH (intracerebral hemorrhage) ([place]) [I61.9] ?Patient Active Problem List  ? Diagnosis Date Noted  ? Hypernatremia 06/10/2021  ? Aspiration pneumonia ([place]) 06/10/2021  ? Paroxysmal atrial fibrillation (HCC)   ? Protein-calorie malnutrition, severe 06/08/2021  ? ICH (intracerebral hemorrhage) ([place]) 06/04/2021  ? ?PCP:  Pcp, No ?Pharmacy:   ?[place] [place] ?[facility] [place] [address] ?Phone: [phone] Fax: [phone] ? ?[facility] ?[address] ?[place] [address] ?Phone: [phone] Fax: [phone] ? ? ? ? ?  Social Determinants of Health (SDOH) Interventions ?  ? ?Readmission Risk Interventions ?   ? View : No data to display.  ?  ?  ?  ? ? ? ?

## 2021-06-17 NOTE — Progress Notes (Signed)
Speech Language Pathology Treatment: Cognitive-Linquistic  ?Patient Details ?Name: Louis Moore ?MRN: [id number] ?DOB: [birth year] ?Today's Date: 06/17/2021 ?Time: 1400-1420 ?SLP Time Calculation (min) (ACUTE ONLY): 20 min ? ?Assessment / Plan / Recommendation ?Clinical Impression ? Pt seen at bedside for aphasia treatment. Pt was awake and alert. Pt had PEG placement 06/16/21. ? ?RECEPTIVE LANGUAGE - pt was able to follow 1-step verbal commands with 90% accuracy. Simple Yes/No responses were 75% accurate. ? ?EXPRESSIVE LANGUAGE - pt was able to repeat single words with fair intelligibility. He was able to count to 5 with visual cues, but was unable to count higher or provide other automatic sequences despite cueing. Pt was unable to name common objects. His responses were inconsistent, and he eventually turned his head away and did not respond any further.  ? ?Continued ST intervention is recommended to maximize functional and effective communication.  ?  ?HPI HPI: 71 y.o. male with no significant PMH but has not seen a PCP in decades admitted with aphasia and right sided weakness. Dx acute Intraparenchymal hemorrhage in the high L posterior frontal lobe with associated cerebral edema, mass effect, midline shift and brain compression. Cortrak placement unsuccessful 4/3. ?  ?   ?SLP Plan ? Continue with current plan of care ? ?  ?  ?Recommendations for follow up therapy are one component of a multi-disciplinary discharge planning process, led by the attending physician.  Recommendations may be updated based on patient status, additional functional criteria and insurance authorization. ?  ? ?Recommendations  ?Continued ST intervention acutely ?Ongoing ST intervention after discharge, at next level of care.  ?   ?    ?   ? ? ? ? Oral Care Recommendations: Oral care QID ?Follow Up Recommendations: Acute inpatient rehab (3hours/day) ?Assistance recommended at discharge: Frequent or constant Supervision/Assistance ?SLP  Visit Diagnosis: Aphasia (R47.01) ?Plan: Continue with current plan of care ? ? ? ? ?  ?  ? ?Carman Essick B. Junetta Hearn, MSP, CCC-SLP ?Speech Language Pathologist ?Office: [phone] ? ?[facility] ?06/17/2021, 2:26 PM ?

## 2021-06-18 DIAGNOSIS — E43 Unspecified severe protein-calorie malnutrition: Secondary | ICD-10-CM | POA: Diagnosis not present

## 2021-06-18 DIAGNOSIS — R627 Adult failure to thrive: Secondary | ICD-10-CM | POA: Diagnosis not present

## 2021-06-18 DIAGNOSIS — I480 Paroxysmal atrial fibrillation: Secondary | ICD-10-CM | POA: Diagnosis not present

## 2021-06-18 DIAGNOSIS — E870 Hyperosmolality and hypernatremia: Secondary | ICD-10-CM | POA: Diagnosis not present

## 2021-06-18 LAB — GLUCOSE, CAPILLARY
Glucose-Capillary: 192 mg/dL — ABNORMAL HIGH (ref 70–99)
Glucose-Capillary: 164 mg/dL — ABNORMAL HIGH (ref 70–99)
Glucose-Capillary: 141 mg/dL — ABNORMAL HIGH (ref 70–99)

## 2021-06-18 MED ORDER — AMIODARONE HCL 400 MG PO TABS: 400.0000 mg | ORAL_TABLET | Freq: Two times a day (BID) | ORAL | Status: DC

## 2021-06-18 MED ORDER — THIAMINE HCL 100 MG PO TABS: 100.0000 mg | ORAL_TABLET | Freq: Every day | ORAL | Status: DC

## 2021-06-18 MED ORDER — PROSOURCE TF PO LIQD: 90.0000 mL | Freq: Two times a day (BID) | ORAL | Status: DC

## 2021-06-18 MED ORDER — NYSTATIN 100000 UNIT/ML MT SUSP: 5.0000 mL | Freq: Four times a day (QID) | OROMUCOSAL | 0 refills | Status: DC

## 2021-06-18 MED ORDER — LIP MEDEX EX OINT: TOPICAL_OINTMENT | CUTANEOUS | 0 refills | Status: DC | PRN

## 2021-06-18 MED ORDER — FREE WATER: 200.0000 mL | Status: DC

## 2021-06-18 MED ORDER — DILTIAZEM HCL 30 MG PO TABS
30.0000 mg | ORAL_TABLET | Freq: Four times a day (QID) | ORAL | Status: DC
Start: 2021-06-18 — End: 2022-07-17

## 2021-06-18 MED ORDER — PANTOPRAZOLE SODIUM 40 MG PO PACK: 40.0000 mg | PACK | Freq: Every day | ORAL | Status: DC

## 2021-06-18 MED ORDER — NYSTATIN 100000 UNIT/ML MT SUSP
5.0000 mL | Freq: Four times a day (QID) | OROMUCOSAL | Status: DC
  Administered 2021-06-18: 500000 [IU] via ORAL
  Filled 2021-06-18: qty 5

## 2021-06-18 MED ORDER — OSMOLITE 1.5 CAL PO LIQD
1000.0000 mL | ORAL | 0 refills | Status: DC
Start: 2021-06-18 — End: 2022-07-14

## 2021-06-18 MED ORDER — AMIODARONE HCL 200 MG PO TABS: 200.0000 mg | ORAL_TABLET | Freq: Every day | ORAL | Status: DC

## 2021-06-18 MED ORDER — MUPIROCIN 2 % EX OINT: 1.0000 "application " | TOPICAL_OINTMENT | Freq: Two times a day (BID) | CUTANEOUS | 0 refills | Status: DC

## 2021-06-18 NOTE — TOC Transition Note (Signed)
Transition of Care (TOC) - CM/SW Discharge Note ? ? ?Patient Details  ?Name: Louis Moore ?MRN: [id number] ?Date of Birth: 01/17/1951 ? ?Transition of Care [facility]) CM/SW Contact:  ?Baldemar Lenis, LCSW ?Phone Number: ?06/18/2021, 12:27 PM ? ? ?Clinical Narrative:   CSW confirmed bed offer at [facility], and discussed with Steward Drone via phone. Steward Drone agreeable to transfer to [facility]. CSW sent discharge information and confirmed bed availability for today. Transport scheduled with [facility] for next available. ? ?Nurse to call report to [phone], Room 201.  ? ? ? ?Final next level of care: Skilled Nursing Facility ?Barriers to Discharge: Barriers Resolved ? ? ?Patient Goals and CMS Choice ?Patient states their goals for this hospitalization and ongoing recovery are:: patient unable to participate in goal setting, not oriented ?CMS Medicare.gov Compare Post Acute Care list provided to:: Patient Represenative (must comment) ?Choice offered to / list presented to : Sibling ? ?Discharge Placement ?  ?           ?Patient chooses bed at:  [facility]) ?Patient to be transferred to facility by: [facility] ?Name of family member notified: Steward Drone ?Patient and family notified of of transfer: 06/18/21 ? ?Discharge Plan and Services ?  ?  ?Post Acute Care Choice: Skilled Nursing Facility          ?  ?  ?  ?  ?  ?  ?  ?  ?  ?  ? ?Social Determinants of Health (SDOH) Interventions ?  ? ? ?Readmission Risk Interventions ?   ? View : No data to display.  ?  ?  ?  ? ? ? ? ? ?

## 2021-06-18 NOTE — Discharge Summary (Signed)
?Discharge Summary ? ?Louis Moore [id number] DOB: Sep 18, 1950 ? ?PCP: Pcp, No ? ?Admit date: 06/04/2021 ?Discharge date: 06/18/2021 ? ?Time spent: 34mins, more than 50% time spent on coordination of care.  ? ?Recommendations for Outpatient Follow-up:  ?F/u with PCP within a week  for hospital discharge follow up, repeat cbc/bmp at follow up ?F/u with cardiology for afib ?F/u with neurology for ICH ?Palliative care to follow patient at snf ?Skin care qshift ?Oral care QID ? ?Discharge Diagnoses:  ?Active Hospital Problems  ? Diagnosis Date Noted  ? ICH (intracerebral hemorrhage) ([place]) 06/04/2021  ? Hypernatremia 06/10/2021  ? Aspiration pneumonia ([place]) 06/10/2021  ? Paroxysmal atrial fibrillation (HCC)   ? Protein-calorie malnutrition, severe 06/08/2021  ?  ?Resolved Hospital Problems  ?No resolved problems to display.  ? ? ?Discharge Condition: stable, improved  ? ?Diet recommendation:  ?NPO except ice chips when fully alert, meds per tube ?Oral care QID ?Aspiration precaution ?Continue tube feeds and free water flushes ?Speech to continue follow patient ? ?Keep abdominal binder in place to prevent G tube dislodgement ?- Leave sutures in place until at least POD7 ?- Please call general surgery if patient has increase in abdominal pain or distension ? ?Filed Weights  ? 06/15/21 0439 06/17/21 0500 06/18/21 0306  ?Weight: 60.3 kg 61.2 kg 62.6 kg  ? ? ?History of present illness:  ?Patient is a 71 years old male with no significant medical history , no PCP, presented to hospital with numbness in his fingers on the right and had a slipped from his bed but did not fall.  His brother called EMS and patient was brought to the hospital on 3/31 for code stroke.  Patient had mild aphasia right upper extremity plegia and right lower extremity weakness and visual neglect.  CT head scan showed acute intraparenchymal hemorrhage around the posterior left frontal lobe with some subarachnoid hemorrhage and intraventricular  extension.  There was some mild 3 mm midline shift.  Initial blood pressure was elevated in the 190s.  Patient was then initially admitted by the neurology team in the hospital in the ICU setting.  Patient was subsequently considered stable for transfer out of the ICU after stabilization and transferred to hospitalist service on 4/6, hospital course complicated by new onset of afib, aspiration pneumonia, he is seen by cardiology and palliative care in the hospital, he is now stable, s/p peg placement by general surgery, he is to discharge to SNF. ? ?Hospital Course:  ?Principal Problem: ?  ICH (intracerebral hemorrhage) ([place]) ?Active Problems: ?  Protein-calorie malnutrition, severe ?  Paroxysmal atrial fibrillation (HCC) ?  Hypernatremia ?  Aspiration pneumonia ([place]) ? ? ?Left high frontal parenchymal intracranial hemorrhage likely from uncontrolled hypertension with intraventricular extension and subarachnoid hemorrhage. With right hemiplegia ?Patient was not on any anticoagulation prior to this admission.   ?CT scan repeated showed a stable ICH with no hydrocephalus.   ?2D echocardiogram with LV ejection fraction of 60 to 65%.   ?LDL at 118, hemoglobin A1c of 5.6.   ?Improved, he is alert, awake, following commands on left side, he is oriented to place and person, confused about the time, continue to have right hemiplegia, dysphagia for which he is npo, nutrition per tube. ? ?Dysphagia ?Npo ?S/p peg tube placement on 4/12 ?Nutrition /free water/meds per tube ?- Keep abdominal binder in place to prevent G tube dislodgement ?- Leave sutures in place until at least POD7 ?- Please call surgery if patient has increase in abdominal pain or distension ?  ?  Fever with mild leukocytosis, possible aspiration pneumonia ? Chest x-ray with bibasilar airspace opacities and mild pleural effusion.     ?Patient received IV Zosyn x7days per concerns of possible aspiration pneumonia.    ?Fever resolved , no cough, no hypoxia,  continue aspiration precaution  ?  ?Hypernatremia.    Received IV fluids.  Sodium normalized ?  ?New-onset atrial fibrillation with RVR.   ?Seen by Cardiology  ? rate controlled on cardizem , amiodarone,  ?no a candidate for anticoagulation due to recent ICH ?F/u with cardiology  ?  ?Essential hypertension.   ?Elevated blood pressure on presentation.  Not on medications at home.   ?Bp stable on current regimen ?  ?  ?Hyperlipidemia.  LDL of 118.  goal of ldl is less  than 70, Continue Crestor. ?  ?Acute kidney injury.  Resolved.   ?  ?Deconditioning, debility ?Snf placement ?  ?Agitation, delirium. ?Improved Vonzell Schlatter, he is calm and following commands on left side ?  ?  ?Large left inguinal hernia.  CT scan was performed overnight without any evidence of obstruction.   ?Seen by General surgery "not incarcerated. Able to be reduced & tolerating tf's w/o emesis + having bm's. No need for urgent intervention" ? ? Diper rash could contribute to mild leukocytosis, getting topical antifungal and being turned ?Forehead lesion , appear itchy, topical steroids, dermatology referral if family desires ?Oral thrush , oral care and nystatin solution /swab QID ? ?Goals of care ?Seen by palliative care  in the hospital on 4/12, continue full scope of treatment, palliative care to continue follow patient at snf ?  ?  ?   ?Code Status:   ?  Code Status: Full Code ?  ?Disposition: SNF ?  ?  ? Family Communication: ?Spoke with the patient's sister/brother at bedside on 4/12. ?  ?Consultants:  ?Neurology ?Cardiology ?Palliative care  ?Gen surg ?  ?Procedures:  ?NG tube placement and removal ?Cortrak tube tube placement and removal ?Peg placement on 4/12 ?  ?Antimicrobials:  ?Zosyn IV 4/5>4/12 ? ? ?Discharge Exam: ?BP (!) 156/67 (BP Location: Left Arm)   Pulse 69   Temp 98.4 ?F (36.9 ?C) (Oral)   Resp 14   Wt 62.6 kg   SpO2 95%  ? ?General: alert, calm and following commands on left side, right hemiplegia, + peg tube/abdominal  binder  ?Cardiovascular: IRRR, rate controlled ?Respiratory: normal respiratory effort  ? ? ? ?Discharge Instructions   ? ? Discharge wound care:   Complete by: As directed ?  ? Pressure offloading, skin care qshift  ? Increase activity slowly   Complete by: As directed ?  ? ?  ? ?Allergies as of 06/18/2021   ?No Known Allergies ?  ? ?  ?Medication List  ?  ? ?TAKE these medications   ? ?amiodarone 400 MG tablet ?Commonly known as: PACERONE ?Place 1 tablet (400 mg total) into feeding tube 2 (two) times daily for 1 day. ?  ?amiodarone 200 MG tablet ?Commonly known as: PACERONE ?Place 1 tablet (200 mg total) into feeding tube daily. ?Start taking on: June 19, 2021 ?  ?diltiazem 30 MG tablet ?Commonly known as: CARDIZEM ?Place 1 tablet (30 mg total) into feeding tube every 6 (six) hours. ?  ?feeding supplement (PROSource TF) liquid ?Place 90 mLs into feeding tube 2 (two) times daily. ?  ?feeding supplement (OSMOLITE 1.5 CAL) Liqd ?Place 1,000 mLs into feeding tube continuous. ?  ?free water Soln ?Place 200 mLs into feeding tube every  4 (four) hours. ?  ?lip balm ointment ?Apply topically as needed for lip care. ?  ?mupirocin ointment 2 % ?Commonly known as: BACTROBAN ?Place 1 application. into the nose 2 (two) times daily. ?  ?nystatin 100000 UNIT/ML suspension ?Commonly known as: MYCOSTATIN ?Take 5 mLs (500,000 Units total) by mouth 4 (four) times daily. ?  ?pantoprazole sodium 40 mg ?Commonly known as: PROTONIX ?Place 40 mg into feeding tube at bedtime. ?  ?thiamine 100 MG tablet ?Place 1 tablet (100 mg total) into feeding tube daily. ?Start taking on: June 19, 2021 ?  ? ?  ? ?  ?  ? ? ?  ?Discharge Care Instructions  ?(From admission, onward)  ?  ? ? ?  ? ?  Start     Ordered  ? 06/18/21 0000  Discharge wound care:       ?Comments: Pressure offloading, skin care qshift  ? 06/18/21 1106  ? ?  ?  ? ?  ? ?No Known Allergies ? Contact information for after-discharge care   ? ? Destination   ? ? [facility] .   ?Service: Skilled Nursing ?Contact information: ?[address] ?[place] ?[phone] ? ?  ?  ? ?  ?  ? ?  ?  ? ?  ? ? ? ?The results of significant diagnostics from this hospita

## 2021-06-18 NOTE — Progress Notes (Signed)
General Surgery ? ?Patient is POD2 s/p lap-assisted PEG placement. Umbilical incision is clean and dry. G tube is in place at 3cm at the skin with feeds running at 50 ml/hr. Abdomen is soft and nontender. ?- Keep abdominal binder in place to prevent G tube dislodgement ?- Leave sutures in place until at least POD7 ?- Please call surgery if patient has increase in abdominal pain or distension ?- We will sign off at this time. Please call with any further questions or concerns. ? ?Sophronia Simas, MD ?[facility] Surgery ?General, Hepatobiliary and Pancreatic Surgery ?06/18/21 7:55 AM ? ?

## 2021-06-18 NOTE — Progress Notes (Signed)
Nurse attempted to call report to [facility]. Phone number left with Diplomatic Services operational officer for nurse to call. Pt IV removed and telemetry discontinued. Belongings sent with patient. Discharge paper sent with [facility] for receiving facility  ? ?Melony Overly, RN  ?

## 2021-06-18 NOTE — Progress Notes (Signed)
Report given to Shanda Bumps, Charity fundraiser. ? ?Melony Overly, RN  ?

## 2021-06-21 NOTE — Progress Notes (Signed)
?                                                                                                                                                     ?                                                   ?Palliative Medicine Progress Note  ? ?Patient Name: Louis Moore       Date: 06/21/2021 ?DOB: 1951/02/25  Age: 71 y.o. MRN#: [id number] ?Attending Physician: No att. providers found ?Primary Care Physician: Pcp, No ?Admit Date: 06/04/2021 ? ? ?HPI/Patient Profile: ?71 y.o. male  with no significant past medical history but has not seen a PCP in decades.  He presented to the emergency department on 06/04/2021 as a code stroke with right-sided weakness and numbness in his fingers.  He woke up with numbness and weakness on the right side and slipped out of bed but did not fall.  Per EMS, SBP initially was in the 190s.  Imaging showed acute intraparenchymal hemorrhage in the left posterior frontal lobe with associated cerebral edema, mass effect, midline shift, and brain compression.  He was admitted to ICU under neurology service.  His hospital course has been complicated by possible aspiration pneumonia (treated and resolved), new onset A-fib with RVR, and AKI (resolved). ?Transferred to hospitalist service on 06/10/21.  ? ? ?Subjective: ?Chart reviewed.  Patient is status post PEG placement yesterday 06/16/2021. ? ?I went to assess patient at bedside.  He is out of bed to the recliner.  He is alert and calm.  He is able to follow commands on the left side.  Speech is aphasic; but he is able to communicate basic needs. ? ?Patient's brother and 2 sisters are at bedside.  Briefly reviewed our goals of care discussion from yesterday.  Family does not have any questions or concerns at this time.  Confirmed that they wish patient to remain full code status at this time. ? ?Discussed the importance of continued conversation with family and the medical team regarding overall plan of care and treatment options, ensuring decisions  are within the context of the patients values and GOCs. ? ? ?Objective: ? ?Physical Exam ?Vitals reviewed.  ?Constitutional:   ?   General: He is not in acute distress. ?Pulmonary:  ?   Effort: Pulmonary effort is normal.  ?Neurological:  ?   Mental Status: He is alert.  ?   Motor: Weakness present.  ?   Comments: Right sided weakness ?Follows commands ?aphasia  ?         ? ?Vital Signs: BP (!) 156/67 (BP Location: Left Arm)   Pulse 69  Temp 98.4 ?F (36.9 ?C) (Oral)   Resp 14   Wt 62.6 kg   SpO2 95%  ?SpO2: SpO2: 95 % ?O2 Device: O2 Device: Room Air ? ? ?LBM: Last BM Date : 06/17/21 ? ?   ?Palliative Assessment/Data: PPS 40% ? ? ? ? ?Palliative Medicine Assessment & Plan  ? ?Assessment: ?Principal Problem: ?  ICH (intracerebral hemorrhage) ([place]) ?Active Problems: ?  Protein-calorie malnutrition, severe ?  Paroxysmal atrial fibrillation (HCC) ?  Hypernatremia ?  Aspiration pneumonia ([place]) ?  ? ?Recommendations/Plan: ?Full code full scope ?Goal is rehab to improve functional status is much as possible ?Family is hopeful for continued improvement ? ? ?Prognosis: ? Unable to determine ? ?Discharge Planning: ?rehab ? ? ? ?Thank you for allowing the Palliative Medicine Team to assist in the care of this patient. ? ? ?MDM - moderate ? ? ?Lavena Bullion, NP ? ? ?Please contact Palliative Medicine Team phone at [phone] for questions and concerns.  ?For individual providers, please see AMION. ? ? ? ? ? ?

## 2022-07-14 ENCOUNTER — Observation Stay (HOSPITAL_COMMUNITY): Payer: Medicare Other

## 2022-07-14 ENCOUNTER — Other Ambulatory Visit: Payer: Self-pay

## 2022-07-14 ENCOUNTER — Emergency Department (HOSPITAL_COMMUNITY): Payer: Medicare Other

## 2022-07-14 ENCOUNTER — Inpatient Hospital Stay (HOSPITAL_COMMUNITY)
Admission: EM | Admit: 2022-07-14 | Discharge: 2022-07-17 | DRG: 101 | Disposition: A | Discharge status: Home Health | Payer: Medicare Other | Attending: Internal Medicine | Admitting: Internal Medicine

## 2022-07-14 ENCOUNTER — Encounter (HOSPITAL_COMMUNITY): Payer: Self-pay | Admitting: Internal Medicine

## 2022-07-14 DIAGNOSIS — Z79899 Other long term (current) drug therapy: Secondary | ICD-10-CM

## 2022-07-14 DIAGNOSIS — G40919 Epilepsy, unspecified, intractable, without status epilepticus: Secondary | ICD-10-CM | POA: Diagnosis present

## 2022-07-14 DIAGNOSIS — K4090 Unilateral inguinal hernia, without obstruction or gangrene, not specified as recurrent: Secondary | ICD-10-CM | POA: Diagnosis present

## 2022-07-14 DIAGNOSIS — I69198 Other sequelae of nontraumatic intracerebral hemorrhage: Secondary | ICD-10-CM

## 2022-07-14 DIAGNOSIS — I69151 Hemiplegia and hemiparesis following nontraumatic intracerebral hemorrhage affecting right dominant side: Secondary | ICD-10-CM

## 2022-07-14 DIAGNOSIS — R239 Unspecified skin changes: Secondary | ICD-10-CM | POA: Diagnosis present

## 2022-07-14 DIAGNOSIS — R569 Unspecified convulsions: Principal | ICD-10-CM

## 2022-07-14 DIAGNOSIS — G40119 Localization-related (focal) (partial) symptomatic epilepsy and epileptic syndromes with simple partial seizures, intractable, without status epilepticus: Secondary | ICD-10-CM | POA: Diagnosis not present

## 2022-07-14 DIAGNOSIS — I480 Paroxysmal atrial fibrillation: Secondary | ICD-10-CM | POA: Diagnosis present

## 2022-07-14 DIAGNOSIS — R059 Cough, unspecified: Secondary | ICD-10-CM | POA: Diagnosis present

## 2022-07-14 DIAGNOSIS — Z87891 Personal history of nicotine dependence: Secondary | ICD-10-CM

## 2022-07-14 DIAGNOSIS — I10 Essential (primary) hypertension: Secondary | ICD-10-CM | POA: Diagnosis present

## 2022-07-14 DIAGNOSIS — H02401 Unspecified ptosis of right eyelid: Secondary | ICD-10-CM | POA: Diagnosis present

## 2022-07-14 DIAGNOSIS — N400 Enlarged prostate without lower urinary tract symptoms: Secondary | ICD-10-CM | POA: Diagnosis present

## 2022-07-14 DIAGNOSIS — G40901 Epilepsy, unspecified, not intractable, with status epilepticus: Secondary | ICD-10-CM

## 2022-07-14 DIAGNOSIS — N5089 Other specified disorders of the male genital organs: Secondary | ICD-10-CM | POA: Diagnosis present

## 2022-07-14 DIAGNOSIS — E871 Hypo-osmolality and hyponatremia: Secondary | ICD-10-CM | POA: Diagnosis present

## 2022-07-14 DIAGNOSIS — Z8679 Personal history of other diseases of the circulatory system: Secondary | ICD-10-CM

## 2022-07-14 DIAGNOSIS — D72829 Elevated white blood cell count, unspecified: Secondary | ICD-10-CM | POA: Diagnosis present

## 2022-07-14 DIAGNOSIS — E785 Hyperlipidemia, unspecified: Secondary | ICD-10-CM | POA: Diagnosis present

## 2022-07-14 DIAGNOSIS — I69192 Facial weakness following nontraumatic intracerebral hemorrhage: Secondary | ICD-10-CM

## 2022-07-14 HISTORY — DX: Other sequelae of cerebral infarction: I69.398

## 2022-07-14 HISTORY — DX: Essential (primary) hypertension: I10

## 2022-07-14 HISTORY — DX: Hemiplegia, unspecified affecting right dominant side: G81.91

## 2022-07-14 HISTORY — DX: Nontraumatic intracerebral hemorrhage, unspecified: I61.9

## 2022-07-14 LAB — BRAIN NATRIURETIC PEPTIDE: B Natriuretic Peptide: 23.7 pg/mL (ref 0.0–100.0)

## 2022-07-14 LAB — COMPREHENSIVE METABOLIC PANEL
ALT: 42 U/L (ref 0–44)
AST: 29 U/L (ref 15–41)
Albumin: 3.6 g/dL (ref 3.5–5.0)
Alkaline Phosphatase: 79 U/L (ref 38–126)
Anion gap: 11 (ref 5–15)
BUN: 10 mg/dL (ref 8–23)
CO2: 24 mmol/L (ref 22–32)
Calcium: 8.8 mg/dL — ABNORMAL LOW (ref 8.9–10.3)
Chloride: 91 mmol/L — ABNORMAL LOW (ref 98–111)
Creatinine, Ser: 1.25 mg/dL — ABNORMAL HIGH (ref 0.61–1.24)
GFR, Estimated: >60 mL/min (ref >60)
Glucose, Bld: 105 mg/dL — ABNORMAL HIGH (ref 70–99)
Potassium: 3.6 mmol/L (ref 3.5–5.1)
Sodium: 126 mmol/L — ABNORMAL LOW (ref 135–145)
Total Bilirubin: 0.7 mg/dL (ref 0.3–1.2)
Total Protein: 6.7 g/dL (ref 6.5–8.1)

## 2022-07-14 LAB — DIFFERENTIAL
Abs Immature Granulocytes: 0.13 10*3/uL — ABNORMAL HIGH (ref 0.00–0.07)
Basophils Absolute: 0.1 10*3/uL (ref 0.0–0.1)
Basophils Relative: 1 %
Eosinophils Absolute: 0.7 10*3/uL — ABNORMAL HIGH (ref 0.0–0.5)
Eosinophils Relative: 6 %
Immature Granulocytes: 1 %
Lymphocytes Relative: 13 %
Lymphs Abs: 1.4 10*3/uL (ref 0.7–4.0)
Monocytes Absolute: 1 10*3/uL (ref 0.1–1.0)
Monocytes Relative: 9 %
Neutro Abs: 7.7 10*3/uL (ref 1.7–7.7)
Neutrophils Relative %: 70 %

## 2022-07-14 LAB — I-STAT CHEM 8, ED
BUN: 11 mg/dL (ref 8–23)
Calcium, Ion: 1.16 mmol/L (ref 1.15–1.40)
Chloride: 91 mmol/L — ABNORMAL LOW (ref 98–111)
Creatinine, Ser: 1.1 mg/dL (ref 0.61–1.24)
Glucose, Bld: 101 mg/dL — ABNORMAL HIGH (ref 70–99)
HCT: 38 % — ABNORMAL LOW (ref 39.0–52.0)
Hemoglobin: 12.9 g/dL — ABNORMAL LOW (ref 13.0–17.0)
Potassium: 3.7 mmol/L (ref 3.5–5.1)
Sodium: 127 mmol/L — ABNORMAL LOW (ref 135–145)
TCO2: 26 mmol/L (ref 22–32)

## 2022-07-14 LAB — ETHANOL: Alcohol, Ethyl (B): <10 mg/dL (ref <10)

## 2022-07-14 LAB — PROTIME-INR
INR: 1.1 (ref 0.8–1.2)
Prothrombin Time: 14.2 seconds (ref 11.4–15.2)

## 2022-07-14 LAB — CBC
HCT: 35.6 % — ABNORMAL LOW (ref 39.0–52.0)
Hemoglobin: 12.6 g/dL — ABNORMAL LOW (ref 13.0–17.0)
MCH: 31.2 pg (ref 26.0–34.0)
MCHC: 35.4 g/dL (ref 30.0–36.0)
MCV: 88.1 fL (ref 80.0–100.0)
Platelets: 292 10*3/uL (ref 150–400)
RBC: 4.04 MIL/uL — ABNORMAL LOW (ref 4.22–5.81)
RDW: 14.3 % (ref 11.5–15.5)
WBC: 11 10*3/uL — ABNORMAL HIGH (ref 4.0–10.5)
nRBC: 0 % (ref 0.0–0.2)

## 2022-07-14 LAB — APTT: aPTT: 29 seconds (ref 24–36)

## 2022-07-14 LAB — CBG MONITORING, ED: Glucose-Capillary: 102 mg/dL — ABNORMAL HIGH (ref 70–99)

## 2022-07-14 MED ORDER — LORAZEPAM 2 MG/ML IJ SOLN
2.0000 mg | Freq: Once | INTRAMUSCULAR | Status: AC
  Administered 2022-07-14: 2 mg via INTRAVENOUS
  Filled 2022-07-14: qty 1

## 2022-07-14 MED ORDER — DILTIAZEM HCL 30 MG PO TABS
30.0000 mg | ORAL_TABLET | Freq: Four times a day (QID) | ORAL | Status: DC
  Administered 2022-07-14 – 2022-07-17 (×11): 30 mg
  Filled 2022-07-14 (×11): qty 1

## 2022-07-14 MED ORDER — VALPROATE SODIUM 100 MG/ML IV SOLN
1500.0000 mg | Freq: Once | INTRAVENOUS | Status: DC
  Filled 2022-07-14: qty 15

## 2022-07-14 MED ORDER — ENOXAPARIN SODIUM 40 MG/0.4ML IJ SOSY
40.0000 mg | PREFILLED_SYRINGE | Freq: Every day | INTRAMUSCULAR | Status: DC
  Administered 2022-07-14 – 2022-07-17 (×4): 40 mg via SUBCUTANEOUS
  Filled 2022-07-14 (×4): qty 0.4

## 2022-07-14 MED ORDER — ACETAMINOPHEN 650 MG RE SUPP: 650.0000 mg | RECTAL | Status: DC | PRN

## 2022-07-14 MED ORDER — AMIODARONE HCL 200 MG PO TABS
200.0000 mg | ORAL_TABLET | Freq: Every day | ORAL | Status: DC
  Administered 2022-07-14 – 2022-07-17 (×4): 200 mg via ORAL
  Filled 2022-07-14 (×4): qty 1

## 2022-07-14 MED ORDER — ATORVASTATIN CALCIUM 10 MG PO TABS
10.0000 mg | ORAL_TABLET | Freq: Every day | ORAL | Status: DC
  Administered 2022-07-14 – 2022-07-17 (×4): 10 mg via ORAL
  Filled 2022-07-14 (×4): qty 1

## 2022-07-14 MED ORDER — VALPROATE SODIUM 100 MG/ML IV SOLN
1500.0000 mg | Freq: Once | INTRAVENOUS | Status: AC
  Administered 2022-07-14: 1500 mg via INTRAVENOUS
  Filled 2022-07-14: qty 15

## 2022-07-14 MED ORDER — LORAZEPAM 2 MG/ML IJ SOLN: 4.0000 mg | INTRAMUSCULAR | Status: DC | PRN

## 2022-07-14 MED ORDER — ACETAMINOPHEN 325 MG PO TABS
650.0000 mg | ORAL_TABLET | ORAL | Status: DC | PRN
  Administered 2022-07-14: 650 mg via ORAL
  Filled 2022-07-14: qty 2

## 2022-07-14 MED ORDER — ONDANSETRON HCL 4 MG/2ML IJ SOLN: 4.0000 mg | Freq: Four times a day (QID) | INTRAMUSCULAR | Status: DC | PRN

## 2022-07-14 MED ORDER — PANTOPRAZOLE SODIUM 40 MG PO TBEC
40.0000 mg | DELAYED_RELEASE_TABLET | Freq: Every day | ORAL | Status: DC
  Administered 2022-07-14 – 2022-07-16 (×3): 40 mg via ORAL
  Filled 2022-07-14 (×3): qty 1

## 2022-07-14 MED ORDER — IOHEXOL 350 MG/ML SOLN
75.0000 mL | Freq: Once | INTRAVENOUS | Status: AC | PRN
  Administered 2022-07-14: 75 mL via INTRAVENOUS

## 2022-07-14 MED ORDER — LACTATED RINGERS IV SOLN: INTRAVENOUS | Status: DC

## 2022-07-14 MED ORDER — LEVETIRACETAM IN NACL 1000 MG/100ML IV SOLN
1000.0000 mg | INTRAVENOUS | Status: AC
  Administered 2022-07-14 (×3): 1000 mg via INTRAVENOUS
  Filled 2022-07-14 (×3): qty 100

## 2022-07-14 MED ORDER — HYDRALAZINE HCL 20 MG/ML IJ SOLN: 5.0000 mg | INTRAMUSCULAR | Status: DC | PRN

## 2022-07-14 MED ORDER — THIAMINE HCL 100 MG PO TABS
100.0000 mg | ORAL_TABLET | Freq: Every day | ORAL | Status: DC
  Administered 2022-07-14 – 2022-07-17 (×4): 100 mg via ORAL
  Filled 2022-07-14 (×8): qty 1

## 2022-07-14 MED ORDER — ALBUTEROL SULFATE (2.5 MG/3ML) 0.083% IN NEBU
2.5000 mg | INHALATION_SOLUTION | RESPIRATORY_TRACT | Status: DC | PRN
  Administered 2022-07-14: 2.5 mg via RESPIRATORY_TRACT
  Filled 2022-07-14: qty 3

## 2022-07-14 MED ORDER — ORAL CARE MOUTH RINSE
15.0000 mL | OROMUCOSAL | Status: DC
  Administered 2022-07-14 – 2022-07-15 (×5): 15 mL via OROMUCOSAL

## 2022-07-14 MED ORDER — ONDANSETRON HCL 4 MG PO TABS: 4.0000 mg | ORAL_TABLET | Freq: Four times a day (QID) | ORAL | Status: DC | PRN

## 2022-07-14 MED ORDER — LEVETIRACETAM IN NACL 1000 MG/100ML IV SOLN
1000.0000 mg | Freq: Two times a day (BID) | INTRAVENOUS | Status: DC
  Administered 2022-07-14 – 2022-07-17 (×6): 1000 mg via INTRAVENOUS
  Filled 2022-07-14 (×7): qty 100

## 2022-07-14 MED ORDER — CHLORHEXIDINE GLUCONATE 0.12% ORAL RINSE (MEDLINE KIT)
15.0000 mL | Freq: Two times a day (BID) | OROMUCOSAL | Status: DC
  Administered 2022-07-15 – 2022-07-17 (×4): 15 mL via OROMUCOSAL

## 2022-07-14 MED ORDER — SODIUM CHLORIDE 0.9% FLUSH
3.0000 mL | Freq: Two times a day (BID) | INTRAVENOUS | Status: DC
  Administered 2022-07-14 – 2022-07-17 (×4): 3 mL via INTRAVENOUS

## 2022-07-14 NOTE — ED Notes (Signed)
Seizure precautions taken and pads placed on bed for safety

## 2022-07-14 NOTE — Evaluation (Signed)
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 male with PMH: HTN, prior ICH with resultant seizures, right hemiparesis, h/o dysphagia with h/o PEG placement (has since been removed at a different facility per family). He presented to the hospital on 07/14/22 with seizure at home. (lives with family) In ER, he had a second seizure. He arrived to ER as code stroke and was loaded with Keppra by neuro without improvement; resolved with Ativan but then reoccured. CT head negative for acute finding but did show remote hemorrhagic insult in left frontal lobe with encephalomalacia.    Assessment / Plan / Recommendation  Clinical Impression  Patient presenting with clinical s/s of dysphagia as per this bedside swallow evaluation, however suspect that he is at or near baseline dysphagia from CVA last year. Per brother and other family members, patient had PEG removed (was placed April 2023) at a different facility but they were not sure the date. Brother reports that patient eats and drinks  well but does have occasional coughing. Patient reports baseline cough even without PO's. SLP assessed patient's PO intake with thin liquids, puree solids, mechanical soft solids. He presented with suspected swallow initiation delay but only one instance of brief cough out of 6-7 sips of thin liquids via straw. No other overt s/s aspiration or penetration and patient able to masticate solids and consume puree solids without significant difficulty. SLP recommending Dys 3 (mechanical soft) solids and thin liquids and will follow briefly to ensure toleration. SLP Visit Diagnosis: Dysphagia, unspecified (R13.10)    Aspiration Risk  Mild aspiration risk    Diet Recommendation Dysphagia 3 (Mech soft);Thin liquid   Liquid Administration via: Cup;Straw Medication Administration: Whole meds with puree Supervision: Patient able to self feed;Staff to assist with self feeding;Full supervision/cueing for compensatory strategies Compensations: Minimize environmental distractions;Slow rate;Small sips/bites Postural Changes: Seated upright at 90 degrees    Other  Recommendations Oral Care Recommendations: Oral care BID    Recommendations for follow up therapy are one component of a multi-disciplinary discharge planning process, led by the attending physician.  Recommendations may be updated based on patient status, additional functional criteria and insurance authorization.  Follow up Recommendations Follow physician's recommendations for discharge plan and follow up therapies      Assistance Recommended at Discharge    Functional Status Assessment Patient has had a recent decline in their functional status and demonstrates the ability to make significant improvements in function in a reasonable and predictable amount of time.  Frequency and Duration min 1 x/week  1 week  Prognosis Prognosis for improved oropharyngeal function: Good      Swallow Study   General Date of Onset: 07/14/22 HPI:  Patient is a 72 y.o. male with PMH: HTN, prior ICH with resultant seizures, right hemiparesis, h/o dysphagia with h/o PEG placement (has since been removed at a different facility per family). He presented to the hospital on 07/14/22 with seizure at home. (lives with family) In ER, he had a second seizure. He arrived to ER as code stroke and was loaded with Keppra by neuro without improvement; resolved with Ativan but then reoccured. CT head negative for acute finding but did show remote hemorrhagic insult in left frontal lobe with encephalomalacia. Type of Study: Bedside Swallow Evaluation Previous Swallow Assessment: during previous admission 2023 Diet Prior to this Study: NPO Temperature Spikes Noted: No Respiratory Status: Room air History of Recent Intubation: No Behavior/Cognition: Alert;Cooperative;Pleasant mood Oral Cavity Assessment: Within Functional Limits Oral Care Completed by SLP: No Oral Cavity - Dentition: Edentulous Vision: Functional for self-feeding Self-Feeding Abilities: Able to feed self;Needs set up;Needs assist Patient Positioning: Upright in bed Baseline Vocal Quality: Low vocal intensity Volitional Cough: Strong Volitional Swallow: Able to elicit    Oral/Motor/Sensory Function Overall Oral Motor/Sensory Function: Within functional limits   Ice Chips     Thin Liquid Thin Liquid: Impaired Presentation: Straw;Self Fed Pharyngeal  Phase Impairments: Suspected delayed Swallow    Nectar Thick Nectar Thick Liquid: Not tested   Honey Thick     Puree Puree: Within functional limits Presentation: Spoon;Self Fed   Solid     Solid: Within functional limits Presentation: Self Fed Other Comments: mastication delayed secondary to edentulous but with patient reporting his gums have hardened and he is able to eat most foods.      Angela Nevin, MA, CCC-SLP Speech Therapy

## 2022-07-14 NOTE — Consult Note (Addendum)
NEUROLOGY CONSULTATION NOTE   Date of service: Jul 14, 2022 Patient Name: Louis Moore MRN:  [id number] DOB:  [birth year] Reason for consult: "R sided twitching concerning for seizure" Requesting Provider: No att. providers found _ _ _   _ __   _ __ _ _  __ __   _ __   __ _  History of Present Illness  Louis Moore is a 72 y.o. male with PMH significant for left posterior frontal intraparenchymal hemorrhage complicated by seizures on Keppra 750 twice daily.  He went to bed last night and woke up at 4 AM with his head twitching, right face twitching and right arm twitching.  This was persistent and so his brother gave him an additional 7.5 mL(750 mg) of his Keppra.  Brother reports the patient is compliant with his Keppra and has not missed any doses.  The twitching did not go away so he called EMS.  For unclear reasons, EMS brought him in as a code stroke for potential concern for a left facial droop.  LKW: 0400 mRS: 3 tNKASE: Not offered due to history of IPH and low suspicion for stroke. Thrombectomy: Not offered due to history of IPH and low suspicion for stroke and low suspicion for LVO. NIHSS components Score: Comment  1a Level of Conscious 0[x]  1[]  2[]  3[]      1b LOC Questions 0[x]  1[]  2[]       1c LOC Commands 0[x]  1[]  2[]       2 Best Gaze 0[x]  1[]  2[]       3 Visual 0[]  1[]  2[x]  3[]      4 Facial Palsy 0[]  1[x]  2[]  3[]      5a Motor Arm - left 0[x]  1[]  2[]  3[]  4[]  UN[]    5b Motor Arm - Right 0[]  1[]  2[]  3[x]  4[]  UN[]    6a Motor Leg - Left 0[x]  1[]  2[]  3[]  4[]  UN[]    6b Motor Leg - Right 0[]  1[]  2[]  3[x]  4[]  UN[]    7 Limb Ataxia 0[x]  1[]  2[]  3[]  UN[]     8 Sensory 0[]  1[]  2[x]  UN[]      9 Best Language 0[x]  1[]  2[]  3[]      10 Dysarthria 0[x]  1[]  2[]  UN[]      11 Extinct. and Inattention 0[x]  1[]  2[]       TOTAL: 11     ROS   Constitutional Denies weight loss, fever and chills.   HEENT Denies changes in vision and hearing.   Respiratory Denies SOB and cough.   CV Denies  palpitations and CP   GI Denies abdominal pain, nausea, vomiting and diarrhea.   GU Denies dysuria and urinary frequency.   MSK Denies myalgia and joint pain.   Skin Denies rash and pruritus.   Neurological Denies headache and syncope.   Psychiatric Denies recent changes in mood. Denies anxiety and depression.    Past History   Past Medical History:  Diagnosis Date   Medical history non-contributory    Past Surgical History:  Procedure Laterality Date   LAPAROSCOPY N/A 06/16/2021   Procedure: LAPAROSCOPIC - ASSISTED PEG;  Surgeon: Fritzi Mandes, MD;  Location: [facility] OR;  Service: General;  Laterality: N/A;   NO PAST SURGERIES     PEG PLACEMENT N/A 06/16/2021   Procedure: PERCUTANEOUS ENDOSCOPIC GASTROSTOMY (PEG) PLACEMENT;  Surgeon: Fritzi Mandes, MD;  Location: [facility] OR;  Service: General;  Laterality: N/A;   No family history on file. Social History   Socioeconomic History   Marital status: Single    Spouse name:  Not on file   Number of children: Not on file   Years of education: Not on file   Highest education level: Not on file  Occupational History   Not on file  Tobacco Use   Smoking status: Never   Smokeless tobacco: Never  Substance and Sexual Activity   Alcohol use: Never   Drug use: Never   Sexual activity: Not on file  Other Topics Concern   Not on file  Social History Narrative   Not on file   Social Determinants of Health   Financial Resource Strain: Not on file  Food Insecurity: Not on file  Transportation Needs: Not on file  Physical Activity: Not on file  Stress: Not on file  Social Connections: Not on file   No Known Allergies  Medications  (Not in a hospital admission)    Vitals   There were no vitals filed for this visit.   There is no height or weight on file to calculate BMI.  Physical Exam   General: Laying comfortably in bed; in no acute distress.  HENT: Normal oropharynx and mucosa. Normal external appearance of ears and nose.   Neck: Supple, no pain or tenderness  CV: No JVD. No peripheral edema.  Pulmonary: Symmetric Chest rise. Normal respiratory effort.  Abdomen: Soft to touch, non-tender.  Ext: No cyanosis, edema, or deformity  Skin: No rash. Normal palpation of skin.   Musculoskeletal: Normal digits and nails by inspection. No clubbing.   Neurologic Examination  Mental status/Cognition: Alert, oriented to self, place, month and year, good attention.  Speech/language: Fluent, comprehension intact, object naming intact, repetition intact.  Cranial nerves:   CN II Pupils equal and reactive to light, right hemianopsia.   CN III,IV,VI EOM intact, no gaze preference or deviation, no nystagmus    CN V normal sensation in V1, V2, and V3 segments bilaterally    CN VII Right facial droop with right facial twitching.   CN VIII normal hearing to speech   CN IX & X normal palatal elevation, no uvular deviation    CN XI 5/5 head turn and 5/5 shoulder shrug bilaterally    CN XII midline tongue protrusion    Motor:  Muscle bulk: poor in right upper extremity. Mvmt Root Nerve  Muscle Right Left Comments  SA C5/6 Ax Deltoid 1 5   EF C5/6 Mc Biceps 0 5   EE C6/7/8 Rad Triceps 0 5   WF C6/7 Med FCR     WE C7/8 PIN ECU     F Ab C8/T1 U ADM/FDI 2 5   HF L1/2/3 Fem Illopsoas 4 5   KE L2/3/4 Fem Quad 4 5   DF L4/5 D Peron Tib Ant 4 5   PF S1/2 Tibial Grc/Sol 4 5    Sensation:  Light touch Decreased on the right side.   Pin prick    Temperature    Vibration   Proprioception    Coordination/Complex Motor:  - Finger to Nose intact on left, unable to do with right - Gait: deferred for patient safety.  Labs   CBC:  Recent Labs  Lab 07/14/22 0631  HGB 12.9*  HCT 38.0*    Basic Metabolic Panel:  Lab Results  Component Value Date   NA 127 (L) 07/14/2022   K 3.7 07/14/2022   CO2 23 06/16/2021   GLUCOSE 101 (H) 07/14/2022   BUN 11 07/14/2022   CREATININE 1.10 07/14/2022   CALCIUM 8.2 (L) 06/16/2021  GFRNONAA >60 06/16/2021   Lipid Panel:  Lab Results  Component Value Date   LDLCALC 118 (H) 06/04/2021   HgbA1c:  Lab Results  Component Value Date   HGBA1C 5.6 06/04/2021   Urine Drug Screen:     Component Value Date/Time   LABOPIA NONE DETECTED 06/05/2021 0224   COCAINSCRNUR NONE DETECTED 06/05/2021 0224   LABBENZ NONE DETECTED 06/05/2021 0224   AMPHETMU NONE DETECTED 06/05/2021 0224   THCU NONE DETECTED 06/05/2021 0224   LABBARB NONE DETECTED 06/05/2021 0224    Alcohol Level No results found for: "ETH"  CT Head without contrast(Personally reviewed): CTH was negative for a large hypodensity concerning for a large territory infarct or hyperdensity concerning for an ICH. L frontal encephalomalacia.  rEEG:  pending  Impression   Louis Moore is a 72 y.o. male with PMH significant for left posterior frontal intraparenchymal hemorrhage complicated by seizures on Keppra 750 twice daily. He presents with focal status with R sided twitching with intact awareness.  No obvious provoking risk factors. He is compliant with his Keppra and has not missed any doses.  2mg  of Ativan stopped the seizure before concern that it briefly returned. Will give additional Keppra and a one time dose of Valproic acid.  Recommendations  - Ativan 2mg  IV once - Keppra 3000mg  Iv once - Valproic acid 1000mg  IV once - increase maintenance Keppra to 1000mg  BID from his home dose of 750mg  BID. - admit for observation overnight. - Routine EEG. No need for LTM at this time, seizure is clinically apparent. ______________________________________________________________________  Plan discussed with patient, his brother Mr. Apolinar Guirguis and with dr. Eudelia Bunch with the ED team.  This patient is critically ill and at significant risk of neurological worsening, death and care requires constant monitoring of vital signs, hemodynamics,respiratory and cardiac monitoring, neurological assessment, discussion with  family, other specialists and medical decision making of high complexity. I spent 40 minutes of neurocritical care time  in the care of  this patient. This was time spent independent of any time provided by nurse practitioner or PA.  Erick Blinks [facility] Pager Number [phone] 07/14/2022  6:58 AM   Thank you for the opportunity to take part in the care of this patient. If you have any further questions, please contact the neurology consultation attending.  Signed,  Erick Blinks [facility] Pager Number [phone] _ _ _   _ __   _ __ _ _  __ __   _ __   __ _

## 2022-07-14 NOTE — ED Notes (Signed)
ED TO INPATIENT HANDOFF REPORT  ED Nurse Name and Phone #: Vernona Rieger [phone]  S Name/Age/Gender Louis Moore 72 y.o. male Room/Bed: 044C/044C  Code Status   Code Status: Full Code  Home/SNF/Other Home Patient oriented to: self and place Is this baseline? No   Triage Complete: Triage complete  Chief Complaint Intractable seizures [facility]) [G40.919]  Triage Note Pt's brother reported to EMS that at 0400 he noticed that pt had right facial droop and twitching, as well as right arm tremor. Pt has previous history of stroke   Allergies No Known Allergies  Level of Care/Admitting Diagnosis ED Disposition     ED Disposition  Admit   Condition  --   Comment  Hospital Area: [facility] [[id number]]  Level of Care: Telemetry Medical [104]  May place patient in observation at [facility] or 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) [facility])    Past Surgical History:  Procedure Laterality Date   LAPAROSCOPY N/A 06/16/2021   Procedure: LAPAROSCOPIC - ASSISTED PEG;  Surgeon: Fritzi Mandes, MD;  Location: [facility] OR;  Service: General;  Laterality: N/A;   PEG PLACEMENT N/A 06/16/2021   Procedure: PERCUTANEOUS ENDOSCOPIC GASTROSTOMY (PEG) PLACEMENT;  Surgeon: Fritzi Mandes, MD;  Location: [facility] OR;  Service: General;  Laterality: N/A;     A IV Location/Drains/Wounds Patient Lines/Drains/Airways Status     Active Line/Drains/Airways     Name Placement date  Placement time Site Days   Peripheral IV 07/14/22 20 G Left Forearm 07/14/22  0701  Forearm  less than 1   Gastrostomy/Enterostomy Percutaneous endoscopic gastrostomy (PEG) 24 Fr. LUQ 06/16/21  1440  LUQ  393   Incision (Closed) 06/16/21 Abdomen Other (Comment) 06/16/21  1427  -- 393   Incision - 1 Port Abdomen 1: Umbilicus 06/16/21  1452  -- 393            Intake/Output Last 24 hours No intake or output data in the 24 hours ending 07/14/22 1156  Labs/Imaging Results for orders placed or performed during the hospital encounter of 07/14/22 (from the past 48 hour(s))  Protime-INR     Status: None   Collection Time: 07/14/22  6:24 AM  Result Value Ref Range   Prothrombin Time 14.2 11.4 - 15.2 seconds   INR 1.1 0.8 - 1.2    Comment: (NOTE) INR goal varies based on device and disease states. Performed at [facility], [address], [place], [place] [address]   APTT     Status: None   Collection Time: 07/14/22  6:24 AM  Result Value Ref Range   aPTT 29 24 - 36 seconds    Comment: Performed at [facility], [address], [place], [place] [address]  CBC     Status: Abnormal   Collection Time: 07/14/22  6:24 AM  Result Value Ref Range   WBC 11.0 (H) 4.0 - 10.5 K/uL   RBC 4.04 (L) 4.22 -  5.81 MIL/uL   Hemoglobin 12.6 (L) 13.0 - 17.0 g/dL   HCT 16.1 (L) 09.6 - 04.5 %   MCV 88.1 80.0 - 100.0 fL   MCH 31.2 26.0 - 34.0 pg   MCHC 35.4 30.0 - 36.0 g/dL   RDW 40.9 81.1 - 91.4 %   Platelets 292 150 - 400 K/uL   nRBC 0.0 0.0 - 0.2 %    Comment: Performed at [facility], [address], [place], [place] [address]  Differential     Status: Abnormal   Collection Time: 07/14/22  6:24 AM  Result Value Ref Range   Neutrophils Relative % 70 %   Neutro Abs 7.7 1.7 - 7.7 K/uL   Lymphocytes Relative 13 %   Lymphs Abs 1.4 0.7 - 4.0 K/uL   Monocytes Relative 9 %   Monocytes Absolute 1.0 0.1 - 1.0 K/uL   Eosinophils Relative 6 %   Eosinophils Absolute 0.7 (H) 0.0 - 0.5  K/uL   Basophils Relative 1 %   Basophils Absolute 0.1 0.0 - 0.1 K/uL   Immature Granulocytes 1 %   Abs Immature Granulocytes 0.13 (H) 0.00 - 0.07 K/uL    Comment: Performed at [facility], [address] [place], [place] [address]  Comprehensive metabolic panel     Status: Abnormal   Collection Time: 07/14/22  6:24 AM  Result Value Ref Range   Sodium 126 (L) 135 - 145 mmol/L   Potassium 3.6 3.5 - 5.1 mmol/L   Chloride 91 (L) 98 - 111 mmol/L   CO2 24 22 - 32 mmol/L   Glucose, Bld 105 (H) 70 - 99 mg/dL    Comment: Glucose reference range applies only to samples taken after fasting for at least 8 hours.   BUN 10 8 - 23 mg/dL   Creatinine, Ser 8.65 (H) 0.61 - 1.24 mg/dL   Calcium 8.8 (L) 8.9 - 10.3 mg/dL   Total Protein 6.7 6.5 - 8.1 g/dL   Albumin 3.6 3.5 - 5.0 g/dL   AST 29 15 - 41 U/L   ALT 42 0 - 44 U/L   Alkaline Phosphatase 79 38 - 126 U/L   Total Bilirubin 0.7 0.3 - 1.2 mg/dL   GFR, Estimated >78 >46 mL/min    Comment: (NOTE) Calculated using the CKD-EPI Creatinine Equation (2021)    Anion gap 11 5 - 15    Comment: Performed at [facility], [address], [place], [place] [address]  Brain natriuretic peptide     Status: None   Collection Time: 07/14/22  6:24 AM  Result Value Ref Range   B Natriuretic Peptide 23.7 0.0 - 100.0 pg/mL    Comment: Performed at [facility], [address], [place], [place] [address]  CBG monitoring, ED     Status: Abnormal   Collection Time: 07/14/22  6:25 AM  Result Value Ref Range   Glucose-Capillary 102 (H) 70 - 99 mg/dL    Comment: Glucose reference range applies only to samples taken after fasting for at least 8 hours.  Ethanol     Status: None   Collection Time: 07/14/22  6:28 AM  Result Value Ref Range   Alcohol, Ethyl (B) <10 <10 mg/dL    Comment: (NOTE) Lowest detectable limit for serum alcohol is 10 mg/dL.  For medical purposes only. Performed at [facility], [address], [place],  [place] [address]   I-stat chem 8, ED  Status: Abnormal   Collection Time: 07/14/22  6:31 AM  Result Value Ref Range   Sodium 127 (L) 135 - 145 mmol/L   Potassium 3.7 3.5 - 5.1 mmol/L   Chloride 91 (L) 98 - 111 mmol/L   BUN 11 8 - 23 mg/dL   Creatinine, Ser 4.40 0.61 - 1.24 mg/dL   Glucose, Bld 102 (H) 70 - 99 mg/dL    Comment: Glucose reference range applies only to samples taken after fasting for at least 8 hours.   Calcium, Ion 1.16 1.15 - 1.40 mmol/L   TCO2 26 22 - 32 mmol/L   Hemoglobin 12.9 (L) 13.0 - 17.0 g/dL   HCT 72.5 (L) 36.6 - 44.0 %   DG Chest Port 1 View  Result Date: 07/14/2022 CLINICAL DATA:  Cough.  Code stroke. EXAM: PORTABLE CHEST 1 VIEW COMPARISON:  06/09/2021. FINDINGS: Heart size and mediastinal contours are unremarkable. Lung volumes are low. Scar versus platelike atelectasis noted in the left base. No interstitial edema or airspace disease. IMPRESSION: 1. Low lung volumes. 2. Scar versus platelike atelectasis in the left base. Electronically Signed   By: Signa Kell M.D.   On: 07/14/2022 07:56   CT HEAD CODE STROKE WO CONTRAST  Result Date: 07/14/2022 CLINICAL DATA:  Code stroke.  Seizure with right-sided twitching EXAM: CT HEAD WITHOUT CONTRAST TECHNIQUE: Contiguous axial images were obtained from the base of the skull through the vertex without intravenous contrast. RADIATION DOSE REDUCTION: This exam was performed according to the departmental dose-optimization program which includes automated exposure control, adjustment of the mA and/or kV according to patient size and/or use of iterative reconstruction technique. COMPARISON:  12/01/2021 FINDINGS: Brain: Low density and volume loss the left frontal parietal junction with linear high-density attributed to mineralization, stable from 12/01/2021 and affecting cortex in this patient with history of seizure. Low-density attributed to wallerian degeneration descending the cortical spinal radiations on the left. No evidence  of acute infarct, acute hemorrhage, hydrocephalus, or mass. Vascular: No hyperdense vessel or unexpected calcification. Skull: Normal. Negative for fracture or focal lesion. Sinuses/Orbits: No acute finding. Other: These results were communicated to Dr. Derry Lory at 6:38 am on 07/14/2022 by text page via the [facility] messaging system. ASPECTS [facility] Stroke Program Early CT Score) - Ganglionic level infarction (caudate, lentiform nuclei, internal capsule, insula, M1-M3 cortex): 7 - Supraganglionic infarction (M4-M6 cortex): 3 when accounting for the chronic encephalomalacia Total score (0-10 with 10 being normal): 10 IMPRESSION: 1. No acute finding. 2. Remote hemorrhagic insult in the left frontal lobe with encephalomalacia and mineralization affecting cortex. Electronically Signed   By: Tiburcio Pea M.D.   On: 07/14/2022 06:40    Pending Labs Unresulted Labs (From admission, onward)     Start     Ordered   07/15/22 0500  Basic metabolic panel  Tomorrow morning,   R        07/14/22 1046   07/15/22 0500  CBC  Tomorrow morning,   R        07/14/22 1046   07/14/22 0624  Urinalysis, Routine w reflex microscopic -Urine, Clean Catch  Once,   URGENT       Question:  Specimen Source  Answer:  Urine, Clean Catch   07/14/22 0623            Vitals/Pain Today's Vitals   07/14/22 1130 07/14/22 1142 07/14/22 1144 07/14/22 1145  BP: (!) 176/77   (!) 157/77  Pulse: 62   61  Resp: 13   14  Temp:  97.7 F (36.5 C)    TempSrc:  Oral    SpO2: 99%   99%  Weight:   77.1 kg   Height:   5\' 9"  (1.753 m)   PainSc:        Isolation Precautions No active isolations  Medications Medications  chlorhexidine gluconate (MEDLINE KIT) (PERIDEX) 0.12 % solution 15 mL (has no administration in time range)  Oral care mouth rinse (has no administration in time range)  LORazepam (ATIVAN) injection 4 mg (has no administration in time range)  acetaminophen (TYLENOL) tablet 650 mg (has no administration in time  range)    Or  acetaminophen (TYLENOL) suppository 650 mg (has no administration in time range)  ondansetron (ZOFRAN) tablet 4 mg (has no administration in time range)    Or  ondansetron (ZOFRAN) injection 4 mg (has no administration in time range)  enoxaparin (LOVENOX) injection 40 mg (has no administration in time range)  sodium chloride flush (NS) 0.9 % injection 3 mL (has no administration in time range)  lactated ringers infusion (has no administration in time range)  hydrALAZINE (APRESOLINE) injection 5 mg (has no administration in time range)  albuterol (PROVENTIL) (2.5 MG/3ML) 0.083% nebulizer solution 2.5 mg (has no administration in time range)  levETIRAcetam (KEPPRA) IVPB 1000 mg/100 mL premix (0 mg Intravenous Stopped 07/14/22 0820)  LORazepam (ATIVAN) injection 2 mg (2 mg Intravenous Given 07/14/22 0653)    Mobility Walks at home, but PT/OT needs to assess     Focused Assessments Neuro Assessment Handoff:  Swallow screen pass? No    NIH Stroke Scale  Dizziness Present: No Headache Present: No Interval: Initial Level of Consciousness (1a.)   : Alert, keenly responsive LOC Questions (1b. )   : Answers one question correctly LOC Commands (1c. )   : Performs both tasks correctly Best Gaze (2. )  : Normal Visual (3. )  : No visual loss Facial Palsy (4. )    : Partial paralysis  Motor Arm, Left (5a. )   : Drift Motor Arm, Right (5b. ) : No movement Motor Leg, Left (6a. )  : No drift Motor Leg, Right (6b. ) : Some effort against gravity Limb Ataxia (7. ): Present in two limbs Sensory (8. )  : Normal, no sensory loss Best Language (9. )  : Mild-to-moderate aphasia Dysarthria (10. ): Mild-to-moderate dysarthria, patient slurs at least some words and, at worst, can be understood with some difficulty Extinction/Inattention (11.)   : No Abnormality Complete NIHSS TOTAL: 14 Last date known well: 07/14/22 Last time known well: 0400 Neuro Assessment: Exceptions to [facility] Neuro  Checks:   Initial (07/14/22 8657)  Has TPA been given? No If patient is a Neuro Trauma and patient is going to OR before floor call report to 4N Charge nurse: [phone] or [phone]   R Recommendations: See Admitting Provider Note  Report given to:   Additional Notes: See below  A&O x4 at baseline. Has exp wheeze, docs aware. Compliant at home with seizure meds

## 2022-07-14 NOTE — ED Provider Notes (Signed)
[place] EMERGENCY DEPARTMENT AT [facility] Provider Note  CSN: [id number] Arrival date & time: 07/14/22 4098  Chief Complaint(s) Code Stroke  HPI Louis Moore is a 72 y.o. male with a past medical history listed below including ICH 1 year ago resulting in right-sided deficits as well as seizures on Keppra who presents to the emergency department with right-sided facial and right upper extremity twitching/jerking.  No focal weakness or loss of vision.  No headache.  No nausea or vomiting.  No chest pain or shortness of breath.  Tremor/twitching began around 4 AM.  Patient reports that he has been compliant with his medication.  Patient brought in as a code stroke by EMS  HPI  Past Medical History Past Medical History:  Diagnosis Date   Medical history non-contributory    Patient Active Problem List   Diagnosis Date Noted   Hypernatremia 06/10/2021   Aspiration pneumonia (HCC) 06/10/2021   Paroxysmal atrial fibrillation (HCC)    Protein-calorie malnutrition, severe 06/08/2021   ICH (intracerebral hemorrhage) (HCC) 06/04/2021   Home Medication(s) Prior to Admission medications   Medication Sig Start Date End Date Taking? Authorizing Provider  amiodarone (PACERONE) 200 MG tablet Place 1 tablet (200 mg total) into feeding tube daily. 06/19/21   Albertine Grates, MD  amiodarone (PACERONE) 400 MG tablet Place 1 tablet (400 mg total) into feeding tube 2 (two) times daily for 1 day. 06/18/21 06/19/21  Albertine Grates, MD  diltiazem (CARDIZEM) 30 MG tablet Place 1 tablet (30 mg total) into feeding tube every 6 (six) hours. 06/18/21   Albertine Grates, MD  lip balm (CARMEX) ointment Apply topically as needed for lip care. 06/18/21   Albertine Grates, MD  mupirocin ointment (BACTROBAN) 2 % Place 1 application. into the nose 2 (two) times daily. 06/18/21   Albertine Grates, MD  Nutritional Supplements (FEEDING SUPPLEMENT, OSMOLITE 1.5 CAL,) LIQD Place 1,000 mLs into feeding tube continuous. 06/18/21   Albertine Grates, MD   Nutritional Supplements (FEEDING SUPPLEMENT, PROSOURCE TF,) liquid Place 90 mLs into feeding tube 2 (two) times daily. 06/18/21   Albertine Grates, MD  nystatin (MYCOSTATIN) 100000 UNIT/ML suspension Take 5 mLs (500,000 Units total) by mouth 4 (four) times daily. 06/18/21   Albertine Grates, MD  pantoprazole sodium (PROTONIX) 40 mg Place 40 mg into feeding tube at bedtime. 06/18/21   Albertine Grates, MD  thiamine 100 MG tablet Place 1 tablet (100 mg total) into feeding tube daily. 06/19/21   Albertine Grates, MD  Water For Irrigation, Sterile (FREE WATER) SOLN Place 200 mLs into feeding tube every 4 (four) hours. 06/18/21   Albertine Grates, MD                                                                                                                                    Allergies Patient has no known allergies.  Review of Systems Review of Systems As noted in HPI  Physical Exam  Vital Signs  I have reviewed the triage vital signs BP (!) 150/55   Pulse (!) 57   Temp (!) 97.5 F (36.4 C) (Oral)   Resp 12   Wt 77.1 kg   SpO2 95%   Physical Exam Vitals reviewed.  Constitutional:      General: He is not in acute distress.    Appearance: He is well-developed. He is not diaphoretic.  HENT:     Head: Normocephalic and atraumatic.     Right Ear: External ear normal.     Left Ear: External ear normal.     Nose: Nose normal.     Mouth/Throat:     Mouth: Mucous membranes are moist.  Eyes:     General: No scleral icterus.    Conjunctiva/sclera: Conjunctivae normal.  Neck:     Trachea: Phonation normal.  Cardiovascular:     Rate and Rhythm: Normal rate and regular rhythm.  Pulmonary:     Effort: Pulmonary effort is normal. No respiratory distress.     Breath sounds: No stridor.  Abdominal:     General: There is no distension.  Musculoskeletal:        General: Normal range of motion.     Cervical back: Normal range of motion.     Right lower leg: 1+ Pitting Edema present.     Left lower leg: 1+ Pitting Edema present.   Neurological:     Mental Status: He is alert and oriented to person, place, and time.     Comments: Right face and UE myoclonus  Psychiatric:        Behavior: Behavior normal.     ED Results and Treatments Labs (all labs ordered are listed, but only abnormal results are displayed) Labs Reviewed  CBC - Abnormal; Notable for the following components:      Result Value   WBC 11.0 (*)    RBC 4.04 (*)    Hemoglobin 12.6 (*)    HCT 35.6 (*)    All other components within normal limits  DIFFERENTIAL - Abnormal; Notable for the following components:   Eosinophils Absolute 0.7 (*)    Abs Immature Granulocytes 0.13 (*)    All other components within normal limits  COMPREHENSIVE METABOLIC PANEL - Abnormal; Notable for the following components:   Sodium 126 (*)    Chloride 91 (*)    Glucose, Bld 105 (*)    Creatinine, Ser 1.25 (*)    Calcium 8.8 (*)    All other components within normal limits  I-STAT CHEM 8, ED - Abnormal; Notable for the following components:   Sodium 127 (*)    Chloride 91 (*)    Glucose, Bld 101 (*)    Hemoglobin 12.9 (*)    HCT 38.0 (*)    All other components within normal limits  CBG MONITORING, ED - Abnormal; Notable for the following components:   Glucose-Capillary 102 (*)    All other components within normal limits  ETHANOL  PROTIME-INR  APTT  RAPID URINE DRUG SCREEN, HOSP PERFORMED  URINALYSIS, ROUTINE W REFLEX MICROSCOPIC  BRAIN NATRIURETIC PEPTIDE  EKG  EKG Interpretation  Date/Time:  Thursday Jul 14 2022 06:59:36 EDT Ventricular Rate:  61 PR Interval:  209 QRS Duration: 110 QT Interval:  464 QTC Calculation: 468 R Axis:   55 Text Interpretation: Sinus rhythm Confirmed by Drema Pry [phone]) on 07/14/2022 7:11:29 AM       Radiology CT HEAD CODE STROKE WO CONTRAST  Result Date: 07/14/2022 CLINICAL DATA:  Code  stroke.  Seizure with right-sided twitching EXAM: CT HEAD WITHOUT CONTRAST TECHNIQUE: Contiguous axial images were obtained from the base of the skull through the vertex without intravenous contrast. RADIATION DOSE REDUCTION: This exam was performed according to the departmental dose-optimization program which includes automated exposure control, adjustment of the mA and/or kV according to patient size and/or use of iterative reconstruction technique. COMPARISON:  12/01/2021 FINDINGS: Brain: Low density and volume loss the left frontal parietal junction with linear high-density attributed to mineralization, stable from 12/01/2021 and affecting cortex in this patient with history of seizure. Low-density attributed to wallerian degeneration descending the cortical spinal radiations on the left. No evidence of acute infarct, acute hemorrhage, hydrocephalus, or mass. Vascular: No hyperdense vessel or unexpected calcification. Skull: Normal. Negative for fracture or focal lesion. Sinuses/Orbits: No acute finding. Other: These results were communicated to Dr. Derry Lory at 6:38 am on 07/14/2022 by text page via the [facility] messaging system. ASPECTS [facility] Stroke Program Early CT Score) - Ganglionic level infarction (caudate, lentiform nuclei, internal capsule, insula, M1-M3 cortex): 7 - Supraganglionic infarction (M4-M6 cortex): 3 when accounting for the chronic encephalomalacia Total score (0-10 with 10 being normal): 10 IMPRESSION: 1. No acute finding. 2. Remote hemorrhagic insult in the left frontal lobe with encephalomalacia and mineralization affecting cortex. Electronically Signed   By: Tiburcio Pea M.D.   On: 07/14/2022 06:40    Medications Ordered in ED Medications  levETIRAcetam (KEPPRA) IVPB 1000 mg/100 mL premix (0 mg Intravenous Stopped 07/14/22 0736)  LORazepam (ATIVAN) injection 2 mg (2 mg Intravenous Given 07/14/22 0653)                                                                                                                                      Procedures .Critical Care  Performed by: Nira Conn, MD Authorized by: Nira Conn, MD   Critical care provider statement:    Critical care time (minutes):  30   Critical care was necessary to treat or prevent imminent or life-threatening deterioration of the following conditions:  CNS failure or compromise   Critical care was time spent personally by me on the following activities:  Development of treatment plan with patient or surrogate, discussions with consultants, evaluation of patient's response to treatment, examination of patient, ordering and review of laboratory studies, ordering and review of radiographic studies, ordering and performing treatments and interventions, pulse oximetry, re-evaluation of patient's condition and review of old charts   (including critical care time)  Medical Decision Making /  ED Course  Click here for ABCD2, HEART and other calculators  Medical Decision Making Amount and/or Complexity of Data Reviewed Labs: ordered. Radiology: ordered.    This patient presents to the ED for concern of right face/UE myoclonus, this involves an extensive number of treatment options, and is a complaint that carries with it a high risk of complications and morbidity. The differential diagnosis includes but not limited to:  Focal seizure Will need to assess for any electrolyte derangements. Less concern for ICH/CVA but given his history patient was taken straight to the CT scanner.  He is not a candidate for tPA/TNK at this time.  Neurology at bedside  Initial intervention:  Neurology recommended loading the patient with Keppra  Work up Interpretation and Management:  Cardiac Monitoring/EKG: EKG w/o acute ischemic changes, or dysrhythmias  Laboratory Tests ordered listed below with my independent interpretation: CBC with mild leukocytosis.  No anemia. CBG within normal limits I-STAT Chem-8  with hyponatremia.  No renal insufficiency. CMP notable for hyponatremia 126.  Mild renal insufficiency without AKI.  No other significant electrolyte derangements. Patient appears to be volume overloaded on exam.  Hyponatremia may be related to volume overload. Patient is also on HCTZ which may be contributing factor.   Imaging Studies ordered listed below with my independent interpretation: CTA notable for remote left frontal ICH.  No acute bleeding. CXR ordered to assess for pulmonary edema.  Negative for pulmonary edema, pneumonia, pneumothorax.    Neurology decided to give patient 2 mg of Ativan, and Depakote.  They recommend admitted patient given the duration of his focal seizure. Seizure stopped after ativan.  Consulting medicine for admission.        Final Clinical Impression(s) / ED Diagnoses Final diagnoses:  Focal seizure (HCC)  Hyponatremia           This chart was dictated using voice recognition software.  Despite best efforts to proofread,  errors can occur which can change the documentation meaning.    Nira Conn, MD 07/14/22 [phone]

## 2022-07-14 NOTE — ED Notes (Signed)
Pt is a little bit more alert and noted to be coughing quite a bit.  EDP is aware.

## 2022-07-14 NOTE — ED Triage Notes (Signed)
Pt's brother reported to EMS that at 0400 he noticed that pt had right facial droop and twitching, as well as right arm tremor. Pt has previous history of stroke

## 2022-07-14 NOTE — H&P (Signed)
History and Physical    Patient: Louis Moore:[id number] DOB: 1950/10/25 DOA: 07/14/2022 DOS: the patient was seen and examined on 07/14/2022 PCP: Pcp, No  Patient coming from: Home - lives with brother; Jackey Loge: Brother, [phone]   Chief Complaint: Seizure  HPI: Louis Moore is a 72 y.o. male with medical history significant of HTN and prior ICH with resultant seizures presenting with seizure.  His brother reports that around 0400 his head, R arm starting shaky and his face was drooping on his right side.  He had a previous stroke and his brother was concerned about seizure.  He was discharged in March and this is maybe his second one total.  He had a seizure at [facility] rehab previously.  In the ER he had a second seizure - brother is not aware.    ER Course:  R-sided weakness, seizures on Keppra at baseline.  Focal seizure that didn't resolve with extra Keppra.  Came in as Code Stroke.  Loaded with Keppra by neuro without improvement, resolved with Ativan but recurred.  Neuro ordered Depakote.  Will get EEG.  Na++ 126 (on HCTZ).     Review of Systems: unable to review all systems due to the inability of the patient to answer questions. 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 His smoking use included cigarettes. He has a 27.50  pack-year smoking history. He has never used smokeless tobacco. He reports that he does not drink alcohol and does not use drugs.  No Known Allergies  No family history on file.  Prior to Admission medications   Medication Sig Start Date End Date Taking? Authorizing Provider  amiodarone (PACERONE) 200 MG tablet Place 1 tablet (200 mg total) into feeding tube daily. 06/19/21   Albertine Grates, MD  amiodarone (PACERONE) 400 MG tablet Place 1 tablet (400 mg total) into feeding tube 2 (two) times daily for 1 day. 06/18/21 06/19/21  Albertine Grates, MD  diltiazem (CARDIZEM) 30 MG tablet Place 1 tablet (30 mg total) into feeding tube every 6 (six) hours. 06/18/21   Albertine Grates, MD  lip balm (CARMEX) ointment Apply topically as needed for lip care. 06/18/21   Albertine Grates, MD  mupirocin ointment (BACTROBAN) 2 % Place 1 application. into the nose 2 (two) times daily. 06/18/21   Albertine Grates, MD  Nutritional Supplements (FEEDING SUPPLEMENT, OSMOLITE 1.5 CAL,) LIQD Place 1,000 mLs into feeding tube continuous. 06/18/21   Albertine Grates, MD  Nutritional Supplements (FEEDING SUPPLEMENT, PROSOURCE TF,) liquid Place 90 mLs into feeding tube 2 (two) times daily. 06/18/21   Albertine Grates, MD  nystatin (MYCOSTATIN) 100000 UNIT/ML suspension Take 5 mLs (500,000 Units total) by mouth 4 (four) times daily. 06/18/21   Albertine Grates, MD  pantoprazole sodium (PROTONIX) 40 mg Place 40 mg into feeding tube at bedtime.  06/18/21   Albertine Grates, MD  thiamine 100 MG tablet Place 1 tablet (100 mg total) into feeding tube daily. 06/19/21   Albertine Grates, MD  Water For Irrigation, Sterile (FREE WATER) SOLN Place 200 mLs into feeding tube every 4 (four) hours. 06/18/21   Albertine Grates, MD    Physical Exam: Vitals:   07/14/22 1142 07/14/22 1144 07/14/22 1145 07/14/22 1354  BP:   (!) 157/77 (!) 174/73  Pulse:   61 67  Resp:   14 15  Temp: 97.7 F (36.5 C)     TempSrc: Oral     SpO2:   99% 100%  Weight:  77.1 kg    Height:  5\' 9"  (1.753 m)     General:  Appears calm and comfortable  and is in NAD; very somnolent s/p Ativan but able to wake up and answer questions when required Eyes:  chronic R ptosis and facial droop s/p ICH ENT:  grossly normal hearing, lips & tongue, mmm Neck:  no LAD, masses or thyromegaly Cardiovascular:  RRR, no m/r/g. 2-3+ LE edema.  Respiratory:   CTA bilaterally with no wheezes/rales/rhonchi.  Normal respiratory effort. Abdomen:  soft, NT, ND Skin:  mild RLE > LLE stasis dermatitis Musculoskeletal:  +crepitus in B thighs with significant scrotal edema; R hemiparesis chronically and developing early contractures Psychiatric:  sedated mood and affect, speech sparse but appropriate, AOx3 Neurologic:  limited by sedation but as above   Radiological Exams on Admission: Independently reviewed - see discussion in A/P where applicable  CT CHEST ABDOMEN PELVIS W CONTRAST  Result Date: 07/14/2022 CLINICAL DATA:  Persistent cough. EXAM: CT CHEST, ABDOMEN, AND PELVIS WITH CONTRAST TECHNIQUE: Multidetector CT imaging of the chest, abdomen and pelvis was performed following the standard protocol during bolus administration of intravenous contrast. RADIATION DOSE REDUCTION: This exam was performed according to the departmental dose-optimization program which includes automated exposure control, adjustment of the mA and/or kV according to patient size and/or use of iterative reconstruction technique. CONTRAST:  75mL OMNIPAQUE IOHEXOL 350 MG/ML SOLN COMPARISON:  Chest x-ray earlier 07/14/2022. CT 06/14/2021. Older exams as well. FINDINGS: CT CHEST FINDINGS Cardiovascular: Patient is tilted in the scanner. Heart is nonenlarged. No pericardial effusion. Coronary artery calcifications are seen. The thoracic aorta has a normal course and caliber. Mediastinum/Nodes: No enlarged mediastinal, hilar, or axillary lymph nodes. Thyroid gland, trachea, and esophagus demonstrate no significant findings.Patulous esophagous. Lungs/Pleura: Mild pleural thickening again seen along the  right hemithorax, similar to previous. No pneumothorax. There is breathing motion. Basilar atelectasis or scar. No dominant lung mass. No left-sided effusion. Musculoskeletal: Cervical ribs identified, congenital variant. Scattered degenerative changes of the spine. Imaging was obtained with the patient's arms at the patient's side, streak artifact. CT ABDOMEN PELVIS FINDINGS Hepatobiliary: No focal liver abnormality is seen. No gallstones, gallbladder wall thickening, or biliary dilatation. Pancreas: Unremarkable. No pancreatic ductal dilatation or surrounding inflammatory changes. Spleen: Twos 1 Adrenals/Urinary Tract: Slight thickening of the adrenal glands, unchanged from previous. No enhancing renal mass or collecting system dilatation bladder is dilated with some dependent calcifications. There is a large posterior bladder diverticulum. Nodular prostate with some focal areas extending into the base of the bladder. The course of the left ureter somewhat atypical extending into a large left inguinal hernia before joining into the area of the bladder and UVJ. Stomach/Bowel: Large and small bowel are nondilated. Scattered stool. Sigmoid colon diverticula. There is large left inguinal hernia involving significant portion of the sigmoid colon. Fat also extends into  the hernia sac. Again no signs of obstruction. The stomach is mildly distended with air and debris. Short-segment small bowel intussusception in the left midabdomen without wall thickening, inflammatory changes. Likely transient. This has not seen previously. Vascular/Lymphatic: Normal caliber aorta and IVC with atherosclerotic changes. No specific abnormal lymph node enlargement identified in the abdomen and pelvis. Reproductive: Enlarged heterogeneous prostate with mass effect along the base of the bladder. Please correlate with BPH changes in the patient's PSA. Other: No free air or free fluid. Musculoskeletal: Curvature and degenerative changes along  the spine. Degenerative changes of the pelvis. IMPRESSION: Stable right-sided pleural thickening. No consolidation, pneumothorax or effusion. Breathing motion. Large left inguinal hernia involving fat and a significant portion of the sigmoid colon without obstruction. This has a wide mouth hernia. Colonic diverticula. Enlarged prostate with mass effect along the base of the bladder. Please correlate with patient's PSA and signs of BPH. Associated large bladder diverticula. Breathing motion. Electronically Signed   By: Karen Kays M.D.   On: 07/14/2022 13:07   DG Chest Port 1 View  Result Date: 07/14/2022 CLINICAL DATA:  Cough.  Code stroke. EXAM: PORTABLE CHEST 1 VIEW COMPARISON:  06/09/2021. FINDINGS: Heart size and mediastinal contours are unremarkable. Lung volumes are low. Scar versus platelike atelectasis noted in the left base. No interstitial edema or airspace disease. IMPRESSION: 1. Low lung volumes. 2. Scar versus platelike atelectasis in the left base. Electronically Signed   By: Signa Kell M.D.   On: 07/14/2022 07:56   CT HEAD CODE STROKE WO CONTRAST  Result Date: 07/14/2022 CLINICAL DATA:  Code stroke.  Seizure with right-sided twitching EXAM: CT HEAD WITHOUT CONTRAST TECHNIQUE: Contiguous axial images were obtained from the base of the skull through the vertex without intravenous contrast. RADIATION DOSE REDUCTION: This exam was performed according to the departmental dose-optimization program which includes automated exposure control, adjustment of the mA and/or kV according to patient size and/or use of iterative reconstruction technique. COMPARISON:  12/01/2021 FINDINGS: Brain: Low density and volume loss the left frontal parietal junction with linear high-density attributed to mineralization, stable from 12/01/2021 and affecting cortex in this patient with history of seizure. Low-density attributed to wallerian degeneration descending the cortical spinal radiations on the left. No  evidence of acute infarct, acute hemorrhage, hydrocephalus, or mass. Vascular: No hyperdense vessel or unexpected calcification. Skull: Normal. Negative for fracture or focal lesion. Sinuses/Orbits: No acute finding. Other: These results were communicated to Dr. Derry Lory at 6:38 am on 07/14/2022 by text page via the [facility] messaging system. ASPECTS [facility] Stroke Program Early CT Score) - Ganglionic level infarction (caudate, lentiform nuclei, internal capsule, insula, M1-M3 cortex): 7 - Supraganglionic infarction (M4-M6 cortex): 3 when accounting for the chronic encephalomalacia Total score (0-10 with 10 being normal): 10 IMPRESSION: 1. No acute finding. 2. Remote hemorrhagic insult in the left frontal lobe with encephalomalacia and mineralization affecting cortex. Electronically Signed   By: Tiburcio Pea M.D.   On: 07/14/2022 06:40    EKG: Independently reviewed.  NSR with rate 61; nonspecific ST changes with no evidence of acute ischemia   Labs on Admission: I have personally reviewed the available labs and imaging studies at the time of the admission.  Pertinent labs:    Na++ 126 BUN 10/Creatinine 1.25/GFR >60 - stable WBC 11 Hgb 12.6 INR 1.1 ETOH <10   Assessment and Plan: Principal Problem:   Intractable seizures (HCC) Active Problems:   History of intracranial hemorrhage   Paroxysmal atrial fibrillation (HCC)  Subcutaneous crepitus   Essential hypertension   Cough    Post-stroke seizure -Patient with known h/o seizures presenting with breakthrough seizures despite Keppra at home and Keppra re-load in the ER -Patient placed in observation overnight for further evaluation -Neurology has seen the patient -Increase Keppra to 1000 mg PO BID -Given a single dose of Depakote, as well -Will order EEG  -Seizure precautions -Ativan prn   H/o CVA -Residual R hemiparesis and facial droop -Continue atorvastatin -No ASA given head bleed -PT/OT/ST consults  Cough -Patient  with coarse cough, possible concern for aspiration -Will leave NPO until seen by speech -He did require a feeding tube after his stroke but this was reversed  Crepitus of thighs with scrotal edema -CT ordered given unusual findings on exam -Large inguinal hernia as well as BPH with bladder impingement -UA is pending  HTN -Continue amiodarone, diltiazem -Hold Zestoretic due to hyponatremia, consider lisinopril without HCTZ if additional BP support is needed  Afib -Continue amiodarone, diltiazem -No AC given h/o ICH     Advance Care Planning:   Code Status: Full Code - Code status was discussed with the patient and brother at the time of admission.  The patient would want to receive full resuscitative measures at this time.   Consults: Neurology; PT/OT/ST; nutrition; TOC team  DVT Prophylaxis: Lovenox  Family Communication: Brother was present throughout evaluation  Severity of Illness: The appropriate patient status for this patient is OBSERVATION. Observation status is judged to be reasonable and necessary in order to provide the required intensity of service to ensure the patient's safety. The patient's presenting symptoms, physical exam findings, and initial radiographic and laboratory data in the context of their medical condition is felt to place them at decreased risk for further clinical deterioration. Furthermore, it is anticipated that the patient will be medically stable for discharge from the hospital within 2 midnights of admission.   Author: Jonah Blue, MD 07/14/2022 5:18 PM  For on call review [email or web address].

## 2022-07-15 DIAGNOSIS — I69192 Facial weakness following nontraumatic intracerebral hemorrhage: Secondary | ICD-10-CM | POA: Diagnosis not present

## 2022-07-15 DIAGNOSIS — E871 Hypo-osmolality and hyponatremia: Secondary | ICD-10-CM | POA: Diagnosis present

## 2022-07-15 DIAGNOSIS — R059 Cough, unspecified: Secondary | ICD-10-CM | POA: Diagnosis present

## 2022-07-15 DIAGNOSIS — I69198 Other sequelae of nontraumatic intracerebral hemorrhage: Secondary | ICD-10-CM | POA: Diagnosis not present

## 2022-07-15 DIAGNOSIS — I480 Paroxysmal atrial fibrillation: Secondary | ICD-10-CM

## 2022-07-15 DIAGNOSIS — I10 Essential (primary) hypertension: Secondary | ICD-10-CM

## 2022-07-15 DIAGNOSIS — D72829 Elevated white blood cell count, unspecified: Secondary | ICD-10-CM | POA: Diagnosis present

## 2022-07-15 DIAGNOSIS — R569 Unspecified convulsions: Secondary | ICD-10-CM

## 2022-07-15 DIAGNOSIS — Z87891 Personal history of nicotine dependence: Secondary | ICD-10-CM | POA: Diagnosis not present

## 2022-07-15 DIAGNOSIS — Z79899 Other long term (current) drug therapy: Secondary | ICD-10-CM | POA: Diagnosis not present

## 2022-07-15 DIAGNOSIS — K4090 Unilateral inguinal hernia, without obstruction or gangrene, not specified as recurrent: Secondary | ICD-10-CM | POA: Diagnosis present

## 2022-07-15 DIAGNOSIS — N400 Enlarged prostate without lower urinary tract symptoms: Secondary | ICD-10-CM | POA: Diagnosis present

## 2022-07-15 DIAGNOSIS — H02401 Unspecified ptosis of right eyelid: Secondary | ICD-10-CM | POA: Diagnosis present

## 2022-07-15 DIAGNOSIS — N5089 Other specified disorders of the male genital organs: Secondary | ICD-10-CM | POA: Diagnosis present

## 2022-07-15 DIAGNOSIS — I69151 Hemiplegia and hemiparesis following nontraumatic intracerebral hemorrhage affecting right dominant side: Secondary | ICD-10-CM | POA: Diagnosis not present

## 2022-07-15 DIAGNOSIS — E785 Hyperlipidemia, unspecified: Secondary | ICD-10-CM | POA: Diagnosis present

## 2022-07-15 DIAGNOSIS — G40119 Localization-related (focal) (partial) symptomatic epilepsy and epileptic syndromes with simple partial seizures, intractable, without status epilepticus: Secondary | ICD-10-CM | POA: Diagnosis present

## 2022-07-15 DIAGNOSIS — Z8679 Personal history of other diseases of the circulatory system: Secondary | ICD-10-CM

## 2022-07-15 DIAGNOSIS — G40919 Epilepsy, unspecified, intractable, without status epilepticus: Secondary | ICD-10-CM | POA: Diagnosis not present

## 2022-07-15 LAB — URINALYSIS, ROUTINE W REFLEX MICROSCOPIC
Bilirubin Urine: NEGATIVE
Glucose, UA: NEGATIVE mg/dL
Ketones, ur: 5 mg/dL — AB
Nitrite: NEGATIVE
Protein, ur: NEGATIVE mg/dL
Specific Gravity, Urine: 1.018 (ref 1.005–1.030)
pH: 7 (ref 5.0–8.0)

## 2022-07-15 LAB — OSMOLALITY: Osmolality: 267 mOsm/kg — ABNORMAL LOW (ref 275–295)

## 2022-07-15 LAB — BASIC METABOLIC PANEL
Anion gap: 8 (ref 5–15)
BUN: 9 mg/dL (ref 8–23)
CO2: 24 mmol/L (ref 22–32)
Calcium: 8.3 mg/dL — ABNORMAL LOW (ref 8.9–10.3)
Chloride: 94 mmol/L — ABNORMAL LOW (ref 98–111)
Creatinine, Ser: 1.05 mg/dL (ref 0.61–1.24)
GFR, Estimated: >60 mL/min (ref >60)
Glucose, Bld: 86 mg/dL (ref 70–99)
Potassium: 4.1 mmol/L (ref 3.5–5.1)
Sodium: 126 mmol/L — ABNORMAL LOW (ref 135–145)

## 2022-07-15 LAB — CBC
HCT: 32.5 % — ABNORMAL LOW (ref 39.0–52.0)
Hemoglobin: 11.6 g/dL — ABNORMAL LOW (ref 13.0–17.0)
MCH: 31.4 pg (ref 26.0–34.0)
MCHC: 35.7 g/dL (ref 30.0–36.0)
MCV: 87.8 fL (ref 80.0–100.0)
Platelets: 281 10*3/uL (ref 150–400)
RBC: 3.7 MIL/uL — ABNORMAL LOW (ref 4.22–5.81)
RDW: 14.3 % (ref 11.5–15.5)
WBC: 11.5 10*3/uL — ABNORMAL HIGH (ref 4.0–10.5)
nRBC: 0 % (ref 0.0–0.2)

## 2022-07-15 LAB — SODIUM, URINE, RANDOM: Sodium, Ur: 140 mmol/L

## 2022-07-15 LAB — MAGNESIUM: Magnesium: 2.1 mg/dL (ref 1.7–2.4)

## 2022-07-15 LAB — OSMOLALITY, URINE: Osmolality, Ur: 499 mOsm/kg (ref 300–900)

## 2022-07-15 LAB — TSH: TSH: 1.188 u[IU]/mL (ref 0.350–4.500)

## 2022-07-15 MED ORDER — SODIUM CHLORIDE 0.9 % IV SOLN: INTRAVENOUS | Status: DC

## 2022-07-15 MED ORDER — TAMSULOSIN HCL 0.4 MG PO CAPS
0.4000 mg | ORAL_CAPSULE | Freq: Every day | ORAL | Status: DC
  Administered 2022-07-15 – 2022-07-17 (×3): 0.4 mg via ORAL
  Filled 2022-07-15 (×3): qty 1

## 2022-07-15 MED ORDER — ORAL CARE MOUTH RINSE: 15.0000 mL | OROMUCOSAL | Status: DC | PRN

## 2022-07-15 NOTE — Evaluation (Signed)
Physical Therapy Evaluation Patient Details Name: Louis Moore MRN: [id number] DOB: 1950-12-15 Today's Date: 07/15/2022  History of Present Illness  72 y/o M admitted to [facility] on 5/9 for breakthrough seizure. CT of head without acute finding. CT chest/abdomen/pelvis shows stable right sided pleural thickening, large left inguinal hernia. PMHx: ICH with right residual hemiparesis, seizure disorder, HTN  Clinical Impression  Pt presents today with breakthrough seizures but appears close to his mobility baseline. Pt has residual R sided deficits from previous CVA, requiring assistance from brother at baseline for all mobility. Pt's brother reports assist for standing and able to ambulate a few feet with rollator and assistance. Pt requiring maxA to stand today and side stepping along EOB but noted significant R lateral lean requiring maxA to correct as well as maxA to facilitate L weight shift to advance RLE. Pt's brother feels comfortable continuing to care for the pt at his current level, recommend HHPT upon discharge to continue to advance mobility in the pt's home environment. Pt's brother asking about a new wheelchair as the current one is too narrow for the pt, recommendation added below. Acute PT will follow up with pt during admission to maintain and advance mobility and strength.        Recommendations for follow up therapy are one component of a multi-disciplinary discharge planning process, led by the attending physician.  Recommendations may be updated based on patient status, additional functional criteria and insurance authorization.  Follow Up Recommendations       Assistance Recommended at Discharge Frequent or constant Supervision/Assistance  Patient can return home with the following  A lot of help with walking and/or transfers;A lot of help with bathing/dressing/bathroom;Assist for transportation;Help with stairs or ramp for entrance    Equipment Recommendations Wheelchair  (measurements PT) (reports current wheelchair is too narrow for pt)  Recommendations for Other Services       Functional Status Assessment Patient has had a recent decline in their functional status and demonstrates the ability to make significant improvements in function in a reasonable and predictable amount of time.     Precautions / Restrictions Precautions Precautions: Fall;Other (comment) Precaution Comments: seizure, residual R weakness Restrictions Weight Bearing Restrictions: No Other Position/Activity Restrictions: family reports AFO but says it does not fit anymore      Mobility  Bed Mobility Overal bed mobility: Needs Assistance Bed Mobility: Supine to Sit, Sit to Supine     Supine to sit: Mod assist, HOB elevated Sit to supine: Mod assist, HOB elevated   General bed mobility comments: modA for BLE management supine<>sit and increased cueing    Transfers Overall transfer level: Needs assistance Equipment used: Rolling walker (2 wheels) Transfers: Sit to/from Stand Sit to Stand: Max assist           General transfer comment: maxA to power up and steady, initial R lateral lean with assist to find midline    Ambulation/Gait               General Gait Details: performing side stepping infront of bed, intermittent buckle of R knee noted with R lateral lean. Increased time to side step with facilitation of L lean required to weight shift and clear R foot to advance, poor coordination noted  Stairs            Wheelchair Mobility    Modified Rankin (Stroke Patients Only)       Balance Overall balance assessment: Needs assistance Sitting-balance support: Single extremity supported, Feet supported Sitting  balance-Leahy Scale: Fair   Postural control: Right lateral lean Standing balance support: Single extremity supported, No upper extremity supported, Reliant on assistive device for balance Standing balance-Leahy Scale: Poor Standing balance  comment: maxA required to correct R lateral lean                             Pertinent Vitals/Pain Pain Assessment Pain Assessment: No/denies pain    Home Living Family/patient expects to be discharged to:: Private residence Living Arrangements: Other relatives Available Help at Discharge: Family;Available 24 hours/day Type of Home: Mobile home Home Access: Ramped entrance       Home Layout: One level Home Equipment: Rollator (4 wheels);BSC/3in1;Wheelchair - manual Additional Comments: lives with brother, room air at baseline    Prior Function Prior Level of Function : Needs assist             Mobility Comments: assist for bed mobility, standing, and step-pivot transfers to wheelchair with use of rollator. Pt able to ambulate ~5-6 feet with assist from brother. Utilizes wheelchair for doctor's appointments       Hand Dominance        Extremity/Trunk Assessment   Upper Extremity Assessment Upper Extremity Assessment: Defer to OT evaluation    Lower Extremity Assessment Lower Extremity Assessment: RLE deficits/detail RLE Deficits / Details: ~3-/5 hip flexion, knee extension/flexion. No active dorsiflexion noted RLE Sensation: decreased light touch RLE Coordination: decreased gross motor    Cervical / Trunk Assessment Cervical / Trunk Assessment: Kyphotic  Communication   Communication: No difficulties  Cognition Arousal/Alertness: Awake/alert Behavior During Therapy: WFL for tasks assessed/performed Overall Cognitive Status: Impaired/Different from baseline Area of Impairment: Following commands, Safety/judgement, Problem solving                       Following Commands: Follows one step commands inconsistently, Follows one step commands with increased time Safety/Judgement: Decreased awareness of safety, Decreased awareness of deficits   Problem Solving: Slow processing, Requires verbal cues, Requires tactile cues General Comments:  family reports cognition seems at baseline, family helping with history with increased time needed for command following and able to follow one step commands intermittently and with increased cueing.        General Comments General comments (skin integrity, edema, etc.): VSS on 2L O2, on room air at baseline    Exercises     Assessment/Plan    PT Assessment Patient needs continued PT services  PT Problem List Decreased strength;Decreased activity tolerance;Decreased balance;Decreased mobility;Decreased coordination;Decreased cognition;Decreased knowledge of use of DME;Decreased knowledge of precautions;Decreased safety awareness;Impaired sensation       PT Treatment Interventions DME instruction;Gait training;Functional mobility training;Therapeutic activities;Therapeutic exercise;Balance training;Neuromuscular re-education;Cognitive remediation;Patient/family education;Wheelchair mobility training    PT Goals (Current goals can be found in the Care Plan section)  Acute Rehab PT Goals Patient Stated Goal: go home PT Goal Formulation: With patient/family Time For Goal Achievement: 07/29/22 Potential to Achieve Goals: Fair    Frequency Min 2X/week     Co-evaluation PT/OT/SLP Co-Evaluation/Treatment: Yes Reason for Co-Treatment: For patient/therapist safety;To address functional/ADL transfers;Complexity of the patient's impairments (multi-system involvement) PT goals addressed during session: Mobility/safety with mobility;Balance;Proper use of DME         AM-PAC PT "6 Clicks" Mobility  Outcome Measure Help needed turning from your back to your side while in a flat bed without using bedrails?: A Little Help needed moving from lying on your back to sitting  on the side of a flat bed without using bedrails?: A Lot Help needed moving to and from a bed to a chair (including a wheelchair)?: A Lot Help needed standing up from a chair using your arms (e.g., wheelchair or bedside  chair)?: A Lot Help needed to walk in hospital room?: Total Help needed climbing 3-5 steps with a railing? : Total 6 Click Score: 11    End of Session Equipment Utilized During Treatment: Gait belt;Oxygen Activity Tolerance: Patient tolerated treatment well Patient left: in bed;with call bell/phone within reach;with bed alarm set;with family/visitor present Nurse Communication: Mobility status PT Visit Diagnosis: Difficulty in walking, not elsewhere classified (R26.2);Ataxic gait (R26.0);Muscle weakness (generalized) (M62.81);Other abnormalities of gait and mobility (R26.89)    Time: 1610-9604 PT Time Calculation (min) (ACUTE ONLY): 29 min   Charges:   PT Evaluation $PT Eval Moderate Complexity: 1 Mod          Lindalou Hose, PT DPT Acute Rehabilitation Services Office [phone]   Leonie Man 07/15/2022, 4:41 PM

## 2022-07-15 NOTE — Progress Notes (Signed)
Patient stated that he does not wear a cpap at home and does not want to wear one here.

## 2022-07-15 NOTE — Evaluation (Signed)
Occupational Therapy Evaluation Patient Details Name: Louis Moore MRN: [id number] DOB: January 01, 1951 Today's Date: 07/15/2022   History of Present Illness 72 y/o M admitted to [facility] on 5/9 for breakthrough seizure. CT of head without acute finding. CT chest/abdomen/pelvis shows stable right sided pleural thickening, large left inguinal hernia. PMHx: ICH with right residual hemiparesis, seizure disorder, HTN   Clinical Impression   At baseline PLOF pt requires Mod assist for Set up for self feeding, Min assist with grooming, and Mod assist for UB/LB bathing/dressing, all steps of toileting task, and functional transfers with a rollator. Pt lives with brother who assists with all ADLs. Pt presents with decline in safety and independence with ADLs and functional transfers. Pt currently demonstrates the need for Mod assist with grooming and Max assist with UB/LB dressing/bathing, all steps of toileting task, and functional transfers. Pt will benefit from acute skilled OT services to increase safety and independence with ADLs and functional mobility and decrease caregiver burden. Post acute discharge, pt will benefit from continued skilled OT services in the home.   Recommendations for follow up therapy are one component of a multi-disciplinary discharge planning process, led by the attending physician.  Recommendations may be updated based on patient status, additional functional criteria and insurance authorization.   Assistance Recommended at Discharge Frequent or constant Supervision/Assistance  Patient can return home with the following Two people to help with walking and/or transfers;Two people to help with bathing/dressing/bathroom;Assistance with cooking/housework;Assistance with feeding;Direct supervision/assist for medications management;Direct supervision/assist for financial management;Assist for transportation;Help with stairs or ramp for entrance    Functional Status Assessment  Patient has  had a recent decline in their functional status and demonstrates the ability to make significant improvements in function in a reasonable and predictable amount of time.  Equipment Recommendations  Other (comment) (TBD pending progress)    Recommendations for Other Services       Precautions / Restrictions Precautions Precautions: Fall;Other (comment) Precaution Comments: seizure, residual R weakness Restrictions Weight Bearing Restrictions: No Other Position/Activity Restrictions: Family reports R UE brace with pt reporting minimal usage and AFO but says it does not fit anymore      Mobility Bed Mobility Overal bed mobility: Needs Assistance Bed Mobility: Supine to Sit, Sit to Supine     Supine to sit: Mod assist, HOB elevated Sit to supine: Mod assist, HOB elevated   General bed mobility comments: modA for BLE management supine<>sit and increased cueing    Transfers Overall transfer level: Needs assistance Equipment used: Rolling walker (2 wheels) Transfers: Sit to/from Stand Sit to Stand: Max assist           General transfer comment: maxA to power up and steady, initial R lateral lean with assist to find midline      Balance Overall balance assessment: Needs assistance Sitting-balance support: Single extremity supported, Feet supported Sitting balance-Leahy Scale: Fair   Postural control: Right lateral lean Standing balance support: Single extremity supported, No upper extremity supported, Reliant on assistive device for balance Standing balance-Leahy Scale: Poor Standing balance comment: maxA required to correct R lateral lean                           ADL either performed or assessed with clinical judgement   ADL Overall ADL's : Needs assistance/impaired Eating/Feeding: Set up   Grooming: Moderate assistance;Sitting   Upper Body Bathing: Maximal assistance;Sitting;Cueing for safety;Cueing for sequencing;Cueing for compensatory techniques    Lower Body Bathing:  Maximal assistance;Cueing for safety;Cueing for sequencing;Cueing for compensatory techniques;Sitting/lateral leans   Upper Body Dressing : Maximal assistance;Cueing for sequencing;Cueing for compensatory techniques;Sitting   Lower Body Dressing: Maximal assistance;Cueing for safety;Cueing for sequencing;Cueing for compensatory techniques;Sitting/lateral leans   Toilet Transfer: Maximal assistance;+2 for physical assistance;+2 for safety/equipment;Stand-pivot;BSC/3in1   Toileting- Clothing Manipulation and Hygiene: Maximal assistance;+2 for physical assistance;+2 for safety/equipment;Cueing for safety;Cueing for sequencing;Cueing for compensatory techniques;Sit to/from stand         General ADL Comments: Pt presents with decline from PLOF.     Vision Baseline Vision/History: 1 Wears glasses Ability to See in Adequate Light: 0 Adequate Patient Visual Report: No change from baseline;Other (comment) (Pt and family report R side visual deficits at baseline secondary to prior ICH.) Additional Comments: At baseline     Perception     Praxis Praxis Praxis tested?: Deficits Deficits: Motor Impersistence;Organization    Pertinent Vitals/Pain Pain Assessment Pain Assessment: No/denies pain     Hand Dominance Left   Extremity/Trunk Assessment Upper Extremity Assessment Upper Extremity Assessment: RUE deficits/detail;LUE deficits/detail;Generalized weakness RUE Deficits / Details: Impaired ROM in all joints of R UE secondary to prior ICH. Contracture of shoulder and elbow and impaired AAROM in wrist and all digits of hand. Pt and family report they were previously instructed in [facility] but have not been consistent with participation. RUE Sensation: decreased light touch RUE Coordination: decreased fine motor;decreased gross motor LUE Deficits / Details: Generalized weakness LUE Sensation: WNL LUE Coordination: decreased fine motor   Lower Extremity  Assessment Lower Extremity Assessment: Defer to PT evaluation RLE Deficits / Details: ~3-/5 hip flexion, knee extension/flexion. No active dorsiflexion noted RLE Sensation: decreased light touch RLE Coordination: decreased gross motor   Cervical / Trunk Assessment Cervical / Trunk Assessment: Kyphotic   Communication Communication Communication: No difficulties   Cognition Arousal/Alertness: Awake/alert Behavior During Therapy: WFL for tasks assessed/performed Overall Cognitive Status: Impaired/Different from baseline Area of Impairment: Following commands, Safety/judgement, Problem solving                       Following Commands: Follows one step commands inconsistently, Follows one step commands with increased time Safety/Judgement: Decreased awareness of safety, Decreased awareness of deficits   Problem Solving: Slow processing, Requires verbal cues, Requires tactile cues General Comments: family reports cognition seems at baseline, family helping with history with increased time needed for command following and able to follow one step commands intermittently and with increased cueing.     General Comments  VSS on 2L continuous O2 through nasal cannula. On RA at baseline.    Exercises     Shoulder Instructions      Home Living Family/patient expects to be discharged to:: Private residence Living Arrangements: Other relatives Available Help at Discharge: Family;Available 24 hours/day Type of Home: Mobile home Home Access: Ramped entrance     Home Layout: One level     Bathroom Shower/Tub: Tub/shower unit;Walk-in shower   Bathroom Toilet: Standard     Home Equipment: Rollator (4 wheels);BSC/3in1;Wheelchair - manual   Additional Comments: lives with brother, room air at baseline      Prior Functioning/Environment Prior Level of Function : Needs assist             Mobility Comments: assist for bed mobility, standing, and step-pivot transfers to  wheelchair with use of rollator. Pt able to ambulate ~5-6 feet with assist from brother. Utilizes wheelchair for doctor's appointments ADLs Comments: assist for dressing, bathing, and toileting ~50%  OT Problem List: Decreased strength;Decreased range of motion;Decreased activity tolerance;Impaired balance (sitting and/or standing);Decreased coordination;Decreased cognition;Decreased safety awareness;Decreased knowledge of use of DME or AE;Decreased knowledge of precautions;Impaired sensation;Impaired tone;Impaired UE functional use      OT Treatment/Interventions: Self-care/ADL training;Therapeutic exercise;Energy conservation;Neuromuscular education;DME and/or AE instruction;Manual therapy;Therapeutic activities;Cognitive remediation/compensation;Patient/family education;Visual/perceptual remediation/compensation;Balance training    OT Goals(Current goals can be found in the care plan section) Acute Rehab OT Goals Patient Stated Goal: To return home with assistance of family OT Goal Formulation: With patient/family Time For Goal Achievement: 07/29/22 Potential to Achieve Goals: Good ADL Goals Pt Will Perform Grooming: with min assist;with caregiver independent in assisting;sitting Pt Will Perform Upper Body Bathing: with mod assist;with caregiver independent in assisting;sitting Pt Will Perform Lower Body Bathing: with mod assist;with caregiver independent in assisting;sit to/from stand Pt Will Perform Upper Body Dressing: with mod assist;with caregiver independent in assisting;sitting Pt Will Perform Lower Body Dressing: with mod assist;with caregiver independent in assisting;sit to/from stand Pt Will Transfer to Toilet: with min assist;ambulating;regular height toilet (with least restrictive AD)  OT Frequency: Min 2X/week    Co-evaluation PT/OT/SLP Co-Evaluation/Treatment: Yes Reason for Co-Treatment: For patient/therapist safety;To address functional/ADL transfers;Complexity of  the patient's impairments (multi-system involvement) PT goals addressed during session: Mobility/safety with mobility;Balance;Proper use of DME OT goals addressed during session: ADL's and self-care      AM-PAC OT "6 Clicks" Daily Activity     Outcome Measure Help from another person eating meals?: A Little Help from another person taking care of personal grooming?: A Lot Help from another person toileting, which includes using toliet, bedpan, or urinal?: A Lot Help from another person bathing (including washing, rinsing, drying)?: A Lot Help from another person to put on and taking off regular upper body clothing?: A Lot Help from another person to put on and taking off regular lower body clothing?: A Lot 6 Click Score: 13   End of Session Equipment Utilized During Treatment: Gait belt;Rolling walker (2 wheels) Nurse Communication: Mobility status  Activity Tolerance: Patient tolerated treatment well Patient left: in bed;with call bell/phone within reach;with bed alarm set;with family/visitor present  OT Visit Diagnosis: Unsteadiness on feet (R26.81);Muscle weakness (generalized) (M62.81);Other symptoms and signs involving the nervous system (R29.898);Hemiplegia and hemiparesis                Time: 1140-1209 OT Time Calculation (min): 29 min Charges:  OT General Charges $OT Visit: 1 Visit OT Evaluation $OT Eval Moderate Complexity: 1 Mod  [address] CourtMolson Coors Brewing., OTR/L, MA Acute Rehab [phone]   Lendon Colonel 07/15/2022, 5:12 PM

## 2022-07-15 NOTE — Progress Notes (Addendum)
PROGRESS NOTE        PATIENT DETAILS Name: Louis Moore Age: 72 y.o. Sex: male Date of Birth: Oct 20, 1950 Admit Date: 07/14/2022 Admitting Physician Jonah Blue, MD PCP:Pcp, No  Brief Summary: Patient is a 72 y.o.  male with history of ICH with right residual hemiparesis, seizure disorder, HTN-who presented with breakthrough seizure.  Significant events: 5/9>> admit to [facility]  Significant studies: 5/9>> CT head: No acute finding 5/9>> CXR: No PNA 5/9>> CT chest/abdomen/pelvis: Stable right sided pleural thickening-no consolidation/pneumothorax-large left inguinal hernia-enlarged prostate.  Significant microbiology data: None  Procedures: None  Consults: Neurology  Subjective: Lying comfortably in bed-denies any chest pain or shortness of breath.  Objective: Vitals: Blood pressure (!) 127/51, pulse 60, temperature 97.8 F (36.6 C), temperature source Oral, resp. rate 13, height 5\' 9"  (1.753 m), weight 77.1 kg, SpO2 95 %.   Exam: Gen Exam:Alert awake-not in any distress HEENT:atraumatic, normocephalic Chest: B/L clear to auscultation anteriorly CVS:S1S2 regular Abdomen:soft non tender, non distended Extremities:no edema do not appreciate any thigh crepitus today-see H&P. Neurology: Right-sided hemiparesis Skin: no rash  Pertinent Labs/Radiology:    Latest Ref Rng & Units 07/15/2022    4:32 AM 07/14/2022    6:31 AM 07/14/2022    6:24 AM  CBC  WBC 4.0 - 10.5 K/uL 11.5   11.0   Hemoglobin 13.0 - 17.0 g/dL 16.1  09.6  04.5   Hematocrit 39.0 - 52.0 % 32.5  38.0  35.6   Platelets 150 - 400 K/uL 281   292     Lab Results  Component Value Date   NA 126 (L) 07/15/2022   K 4.1 07/15/2022   CL 94 (L) 07/15/2022   CO2 24 07/15/2022      Assessment/Plan: Breakthrough seizure Per neurology-provoked due to hyponatremia Keppra increased to 1000 mg twice daily Continue to watch closely-as patient remains hyponatremic-see  below.  Hyponatremia Thought to be due to HCTZ Sodium levels essentially unchanged overnight Switched to NS from LR Check serum osmolality/urine osmolality/urine sodium/TSH. Repeat lytes tomorrow.  PAF Maintaining sinus rhythm Continue amiodarone, diltiazem Deemed not to be a candidate for anticoagulation given history of ICH Telemetry monitoring  HLD Statin  HTN Controlled with Cardizem HCTZ/lisinopril on hold Plans to resume HCTZ on discharge given hyponatremia  Right inguinal hernia Not obstructed/incarcerated. Outpatient follow-up with general surgery for elective repair  History of ICH with right residual hemiparesis  BMI: Estimated body mass index is 25.1 kg/m as calculated from the following:   Height as of this encounter: 5\' 9"  (1.753 m).   Weight as of this encounter: 77.1 kg.   Code status:   Code Status: Full Code   DVT Prophylaxis: enoxaparin (LOVENOX) injection 40 mg Start: 07/14/22 1200   Family Communication:  Spouse-Brenda- [phone]-left voicemail Brother-Daryl-[phone]-updated 5/10  Disposition Plan: Status is: Observation The patient will require care spanning > 2 midnights and should be moved to inpatient because: Severity of illness-ongoing hyponatremia-no improvement-being switched to normal saline 75 cc an hour-with plans to repeat electrolytes tomorrow.   Planned Discharge Destination:Home health   Diet: Diet Order             DIET DYS 3 Room service appropriate? Yes; Fluid consistency: Thin  Diet effective now                     Antimicrobial agents:  Anti-infectives (From admission, onward)    None        MEDICATIONS: Scheduled Meds:  amiodarone  200 mg Oral Daily   atorvastatin  10 mg Oral Daily   chlorhexidine gluconate (MEDLINE KIT)  15 mL Mouth Rinse BID   diltiazem  30 mg Per Tube QID   enoxaparin (LOVENOX) injection  40 mg Subcutaneous Daily   mouth rinse  15 mL Mouth Rinse 10 times per day    pantoprazole  40 mg Oral QHS   sodium chloride flush  3 mL Intravenous Q12H   thiamine  100 mg Oral Daily   Continuous Infusions:  sodium chloride 75 mL/hr at 07/15/22 0809   levETIRAcetam 1,000 mg (07/15/22 1038)   PRN Meds:.acetaminophen **OR** acetaminophen, albuterol, hydrALAZINE, LORazepam, ondansetron **OR** ondansetron (ZOFRAN) IV   I have personally reviewed following labs and imaging studies  LABORATORY DATA: CBC: Recent Labs  Lab 07/14/22 0624 07/14/22 0631 07/15/22 0432  WBC 11.0*  --  11.5*  NEUTROABS 7.7  --   --   HGB 12.6* 12.9* 11.6*  HCT 35.6* 38.0* 32.5*  MCV 88.1  --  87.8  PLT 292  --  281    Basic Metabolic Panel: Recent Labs  Lab 07/14/22 0624 07/14/22 0631 07/15/22 0432  NA 126* 127* 126*  K 3.6 3.7 4.1  CL 91* 91* 94*  CO2 24  --  24  GLUCOSE 105* 101* 86  BUN 10 11 9   CREATININE 1.25* 1.10 1.05  CALCIUM 8.8*  --  8.3*    GFR: Estimated Creatinine Clearance: 62.7 mL/min (by C-G formula based on SCr of 1.05 mg/dL).  Liver Function Tests: Recent Labs  Lab 07/14/22 0624  AST 29  ALT 42  ALKPHOS 79  BILITOT 0.7  PROT 6.7  ALBUMIN 3.6   No results for input(s): "LIPASE", "AMYLASE" in the last 168 hours. No results for input(s): "AMMONIA" in the last 168 hours.  Coagulation Profile: Recent Labs  Lab 07/14/22 0624  INR 1.1    Cardiac Enzymes: No results for input(s): "CKTOTAL", "CKMB", "CKMBINDEX", "TROPONINI" in the last 168 hours.  BNP (last 3 results) No results for input(s): "PROBNP" in the last 8760 hours.  Lipid Profile: No results for input(s): "CHOL", "HDL", "LDLCALC", "TRIG", "CHOLHDL", "LDLDIRECT" in the last 72 hours.  Thyroid Function Tests: No results for input(s): "TSH", "T4TOTAL", "FREET4", "T3FREE", "THYROIDAB" in the last 72 hours.  Anemia Panel: No results for input(s): "VITAMINB12", "FOLATE", "FERRITIN", "TIBC", "IRON", "RETICCTPCT" in the last 72 hours.  Urine analysis:    Component Value  Date/Time   COLORURINE YELLOW 06/09/2021 1215   APPEARANCEUR CLOUDY (A) 06/09/2021 1215   LABSPEC 1.027 06/09/2021 1215   PHURINE 5.0 06/09/2021 1215   GLUCOSEU NEGATIVE 06/09/2021 1215   HGBUR MODERATE (A) 06/09/2021 1215   BILIRUBINUR NEGATIVE 06/09/2021 1215   KETONESUR NEGATIVE 06/09/2021 1215   PROTEINUR 100 (A) 06/09/2021 1215   NITRITE NEGATIVE 06/09/2021 1215   LEUKOCYTESUR NEGATIVE 06/09/2021 1215    Sepsis Labs: Lactic Acid, Venous No results found for: "LATICACIDVEN"  MICROBIOLOGY: No results found for this or any previous visit (from the past 240 hour(s)).  RADIOLOGY STUDIES/RESULTS: CT CHEST ABDOMEN PELVIS W CONTRAST  Result Date: 07/14/2022 CLINICAL DATA:  Persistent cough. EXAM: CT CHEST, ABDOMEN, AND PELVIS WITH CONTRAST TECHNIQUE: Multidetector CT imaging of the chest, abdomen and pelvis was performed following the standard protocol during bolus administration of intravenous contrast. RADIATION DOSE REDUCTION: This exam was performed according to the departmental dose-optimization program which  includes automated exposure control, adjustment of the mA and/or kV according to patient size and/or use of iterative reconstruction technique. CONTRAST:  75mL OMNIPAQUE IOHEXOL 350 MG/ML SOLN COMPARISON:  Chest x-ray earlier 07/14/2022. CT 06/14/2021. Older exams as well. FINDINGS: CT CHEST FINDINGS Cardiovascular: Patient is tilted in the scanner. Heart is nonenlarged. No pericardial effusion. Coronary artery calcifications are seen. The thoracic aorta has a normal course and caliber. Mediastinum/Nodes: No enlarged mediastinal, hilar, or axillary lymph nodes. Thyroid gland, trachea, and esophagus demonstrate no significant findings.Patulous esophagous. Lungs/Pleura: Mild pleural thickening again seen along the right hemithorax, similar to previous. No pneumothorax. There is breathing motion. Basilar atelectasis or scar. No dominant lung mass. No left-sided effusion. Musculoskeletal:  Cervical ribs identified, congenital variant. Scattered degenerative changes of the spine. Imaging was obtained with the patient's arms at the patient's side, streak artifact. CT ABDOMEN PELVIS FINDINGS Hepatobiliary: No focal liver abnormality is seen. No gallstones, gallbladder wall thickening, or biliary dilatation. Pancreas: Unremarkable. No pancreatic ductal dilatation or surrounding inflammatory changes. Spleen: Twos 1 Adrenals/Urinary Tract: Slight thickening of the adrenal glands, unchanged from previous. No enhancing renal mass or collecting system dilatation bladder is dilated with some dependent calcifications. There is a large posterior bladder diverticulum. Nodular prostate with some focal areas extending into the base of the bladder. The course of the left ureter somewhat atypical extending into a large left inguinal hernia before joining into the area of the bladder and UVJ. Stomach/Bowel: Large and small bowel are nondilated. Scattered stool. Sigmoid colon diverticula. There is large left inguinal hernia involving significant portion of the sigmoid colon. Fat also extends into the hernia sac. Again no signs of obstruction. The stomach is mildly distended with air and debris. Short-segment small bowel intussusception in the left midabdomen without wall thickening, inflammatory changes. Likely transient. This has not seen previously. Vascular/Lymphatic: Normal caliber aorta and IVC with atherosclerotic changes. No specific abnormal lymph node enlargement identified in the abdomen and pelvis. Reproductive: Enlarged heterogeneous prostate with mass effect along the base of the bladder. Please correlate with BPH changes in the patient's PSA. Other: No free air or free fluid. Musculoskeletal: Curvature and degenerative changes along the spine. Degenerative changes of the pelvis. IMPRESSION: Stable right-sided pleural thickening. No consolidation, pneumothorax or effusion. Breathing motion. Large left  inguinal hernia involving fat and a significant portion of the sigmoid colon without obstruction. This has a wide mouth hernia. Colonic diverticula. Enlarged prostate with mass effect along the base of the bladder. Please correlate with patient's PSA and signs of BPH. Associated large bladder diverticula. Breathing motion. Electronically Signed   By: Karen Kays M.D.   On: 07/14/2022 13:07   DG Chest Port 1 View  Result Date: 07/14/2022 CLINICAL DATA:  Cough.  Code stroke. EXAM: PORTABLE CHEST 1 VIEW COMPARISON:  06/09/2021. FINDINGS: Heart size and mediastinal contours are unremarkable. Lung volumes are low. Scar versus platelike atelectasis noted in the left base. No interstitial edema or airspace disease. IMPRESSION: 1. Low lung volumes. 2. Scar versus platelike atelectasis in the left base. Electronically Signed   By: Signa Kell M.D.   On: 07/14/2022 07:56   CT HEAD CODE STROKE WO CONTRAST  Result Date: 07/14/2022 CLINICAL DATA:  Code stroke.  Seizure with right-sided twitching EXAM: CT HEAD WITHOUT CONTRAST TECHNIQUE: Contiguous axial images were obtained from the base of the skull through the vertex without intravenous contrast. RADIATION DOSE REDUCTION: This exam was performed according to the departmental dose-optimization program which includes automated exposure control, adjustment  of the mA and/or kV according to patient size and/or use of iterative reconstruction technique. COMPARISON:  12/01/2021 FINDINGS: Brain: Low density and volume loss the left frontal parietal junction with linear high-density attributed to mineralization, stable from 12/01/2021 and affecting cortex in this patient with history of seizure. Low-density attributed to wallerian degeneration descending the cortical spinal radiations on the left. No evidence of acute infarct, acute hemorrhage, hydrocephalus, or mass. Vascular: No hyperdense vessel or unexpected calcification. Skull: Normal. Negative for fracture or focal  lesion. Sinuses/Orbits: No acute finding. Other: These results were communicated to Dr. Derry Lory at 6:38 am on 07/14/2022 by text page via the [facility] messaging system. ASPECTS [facility] Stroke Program Early CT Score) - Ganglionic level infarction (caudate, lentiform nuclei, internal capsule, insula, M1-M3 cortex): 7 - Supraganglionic infarction (M4-M6 cortex): 3 when accounting for the chronic encephalomalacia Total score (0-10 with 10 being normal): 10 IMPRESSION: 1. No acute finding. 2. Remote hemorrhagic insult in the left frontal lobe with encephalomalacia and mineralization affecting cortex. Electronically Signed   By: Tiburcio Pea M.D.   On: 07/14/2022 06:40     LOS: 0 days   Jeoffrey Massed, MD  [facility]    To contact the attending provider between 7A-7P or the covering provider during after hours 7P-7A, please log into the web site [email or web address] and access using universal  password for that web site. If you do not have the password, please call the hospital operator.  07/15/2022, 10:58 AM

## 2022-07-15 NOTE — Progress Notes (Signed)
Neurology Progress Note  Brief HPI: 72 y.o. 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  Patient reports compliance with his Keppra with no missed doses.  Seizure activity was aborted with 2 mg of Ativan with concern for return of seizure activity s/p Keppra load and one-time dose of valproic acid.  Subjective: Seizure activity has resolved, patient states that he has had no further twitching since presentation.  Exam: Vitals:   07/15/22 0010 07/15/22 0433  BP: (!) 147/57 (!) 127/51  Pulse: 60 60  Resp: 15 13  Temp: 97.8 F (36.6 C) 97.8 F (36.6 C)  SpO2:     Gen: In bed, NAD Resp: non-labored breathing, no acute distress Abd: soft, nt  Neuro: Mental Status: Alert, oriented to self, place, month and year, good attention.   Speech is fluent, comprehension is intact, he is able to correctly name objects and repeat phrases.   Patient follows commands without difficulty. No neglect noted Cranial Nerves: PERRL, right hemianopsia, EOMI without gaze preference, right facial droop including right eye ptosis, hearing is intact to voice, shoulders shrug symmetrically, tongue protrudes midline Motor: Left upper extremity elevates antigravity without vertical drift.  Right upper extremity with contracture.  Patient with significantly weakened grip on the right hand with minimal right finger movement.  No antigravity movement of the right upper extremity noted. Bilateral lower extremities with 2+ pitting edema.  Left lower extremity is able to elevate antigravity without vertical drift.  Right lower extremity is able to elevate antigravity with some vertical drift. Sensory: Patient reports sensation is intact and symmetric to light touch throughout Gait: Deferred for patient safety as he is wheelchair-bound at baseline  Pertinent  Labs: CBC    Component Value Date/Time   WBC 11.5 (H) 07/15/2022 0432   RBC 3.70 (L) 07/15/2022 0432   HGB 11.6 (L) 07/15/2022 0432   HCT 32.5 (L) 07/15/2022 0432   PLT 281 07/15/2022 0432   MCV 87.8 07/15/2022 0432   MCH 31.4 07/15/2022 0432   MCHC 35.7 07/15/2022 0432   RDW 14.3 07/15/2022 0432   LYMPHSABS 1.4 07/14/2022 0624   MONOABS 1.0 07/14/2022 0624   EOSABS 0.7 (H) 07/14/2022 0624   BASOSABS 0.1 07/14/2022 0624   CMP     Component Value Date/Time   NA 126 (L) 07/15/2022 0432   K 4.1 07/15/2022 0432   CL 94 (L) 07/15/2022 0432   CO2 24 07/15/2022 0432   GLUCOSE 86 07/15/2022 0432   BUN 9 07/15/2022 0432   CREATININE 1.05 07/15/2022 0432   CALCIUM 8.3 (L) 07/15/2022 0432   PROT 6.7 07/14/2022 0624   ALBUMIN 3.6 07/14/2022 0624   AST 29 07/14/2022 0624   ALT 42 07/14/2022 0624   ALKPHOS 79 07/14/2022 0624   BILITOT 0.7 07/14/2022 0624   GFRNONAA >60 07/15/2022 0432   Urinalysis    Component Value Date/Time   COLORURINE YELLOW 06/09/2021 1215   APPEARANCEUR CLOUDY (A) 06/09/2021 1215   LABSPEC 1.027 06/09/2021 1215   PHURINE 5.0 06/09/2021 1215   GLUCOSEU NEGATIVE 06/09/2021 1215   HGBUR MODERATE (A) 06/09/2021 1215   BILIRUBINUR NEGATIVE 06/09/2021 1215   KETONESUR NEGATIVE 06/09/2021 1215   PROTEINUR 100 (A) 06/09/2021 1215   NITRITE NEGATIVE 06/09/2021 1215   LEUKOCYTESUR NEGATIVE 06/09/2021 1215   Alcohol Level    Component Value Date/Time  ETH <10 07/14/2022 1610   Imaging Reviewed:  CT head  5/9: 1. No acute finding. 2. Remote hemorrhagic insult in the left frontal lobe with encephalomalacia and mineralization affecting cortex.  Assessment: Louis Moore is a 72 y.o. male with PMH significant for left posterior frontal intraparenchymal hemorrhage complicated by seizures on Keppra 750 twice daily. He presents with focal status with R sided twitching with intact awareness. No obvious provoking risk factors. He is compliant with his Keppra and  has not missed any doses. 2mg  of Ativan stopped the seizure before concern that it briefly returned. Patient was given additional Keppra and a one time dose of Valproic acid with resolution of symptoms.  It is most consistent with a breakthrough seizure and home dose Keppra was increased to 1000 mg twice daily.  Impression:  Breakthrough seizure with possible trigger of hyponatermia. Symptoms resolved with increase keppra which is tolerating well. Appreciate workup and management of hyponatremia per primary team. Also will check magnesium, replete as needed.   Recommendations: - Continue increased home Keppra dose at 1000 mg twice daily - Given seizure activity has resolved, no need for repeat EEG at this time - Continue seizure precautions - Please address hyponatremia as this can lower seizure threshold, appreciate primary team's efforts  - Check magnesium replete if needed (added on) - No further inpatient work up required.  Recommend patient follow up with his outpatient neurologist; inpatient neurology will be available as needed - recommendations conveyed to primary team via secure chat  Seizure precautions: Per [facility] statutes, patients with seizures are not allowed to drive until they have been seizure-free for six months and cleared by a physician    Use caution when using heavy equipment or power tools. Avoid working on ladders or at heights. Take showers instead of baths. Ensure the water temperature is not too high on the home water heater. Do not go swimming alone. Do not lock yourself in a room alone (i.e. bathroom). When caring for infants or small children, sit down when holding, feeding, or changing them to minimize risk of injury to the child in the event you have a seizure. Maintain good sleep hygiene. Avoid alcohol.    If patient has another seizure, call 911 and bring them back to the ED if: A.  The seizure lasts longer than 5 minutes.      B.  The patient  doesn't wake shortly after the seizure or has new problems such as difficulty seeing, speaking or moving following the seizure C.  The patient was injured during the seizure D.  The patient has a temperature over 102 F (39C) E.  The patient vomited during the seizure and now is having trouble breathing    During the Seizure   - First, ensure adequate ventilation and place patients on the floor on their left side  Loosen clothing around the neck and ensure the airway is patent. If the patient is clenching the teeth, do not force the mouth open with any object as this can cause severe damage - Remove all items from the surrounding that can be hazardous. The patient may be oblivious to what's happening and may not even know what he or she is doing. If the patient is confused and wandering, either gently guide him/her away and block access to outside areas - Reassure the individual and be comforting - Call 911. In most cases, the seizure ends before EMS arrives. However, there are cases when seizures may last over  3 to 5 minutes. Or the individual may have developed breathing difficulties or severe injuries. If a pregnant patient or a person with diabetes develops a seizure, it is prudent to call an ambulance. - Finally, if the patient does not regain full consciousness, then call EMS. Most patients will remain confused for about 45 to 90 minutes after a seizure, so you must use judgment in calling for help. - Avoid restraints but make sure the patient is in a bed with padded side rails - Place the individual in a lateral position with the neck slightly flexed; this will help the saliva drain from the mouth and prevent the tongue from falling backward - Remove all nearby furniture and other hazards from the area - Provide verbal assurance as the individual is regaining consciousness - Provide the patient with privacy if possible - Call for help and start treatment as ordered by the caregiver     After the Seizure (Postictal Stage)   After a seizure, most patients experience confusion, fatigue, muscle pain and/or a headache. Thus, one should permit the individual to sleep. For the next few days, reassurance is essential. Being calm and helping reorient the person is also of importance.   Most seizures are painless and end spontaneously. Seizures are not harmful to others but can lead to complications such as stress on the lungs, brain and the heart. Individuals with prior lung problems may develop labored breathing and respiratory distress.    Lanae Boast, AGACNP-BC [facility] [phone]  Attending Neurologist's note:  I personally saw this patient, gathering history, performing a neurologic examination, reviewing relevant labs, and formulated the assessment and plan, adding the note above for completeness and clarity to accurately reflect my thoughts

## 2022-07-16 LAB — BASIC METABOLIC PANEL
Anion gap: 6 (ref 5–15)
BUN: 9 mg/dL (ref 8–23)
CO2: 23 mmol/L (ref 22–32)
Calcium: 8 mg/dL — ABNORMAL LOW (ref 8.9–10.3)
Chloride: 98 mmol/L (ref 98–111)
Creatinine, Ser: 0.97 mg/dL (ref 0.61–1.24)
GFR, Estimated: >60 mL/min (ref >60)
Glucose, Bld: 85 mg/dL (ref 70–99)
Potassium: 3.9 mmol/L (ref 3.5–5.1)
Sodium: 127 mmol/L — ABNORMAL LOW (ref 135–145)

## 2022-07-16 MED ORDER — FUROSEMIDE 10 MG/ML IJ SOLN
40.0000 mg | Freq: Once | INTRAMUSCULAR | Status: AC
  Administered 2022-07-16: 40 mg via INTRAVENOUS
  Filled 2022-07-16: qty 4

## 2022-07-16 NOTE — Progress Notes (Signed)
PROGRESS NOTE        PATIENT DETAILS Name: Louis Moore Age: 72 y.o. Sex: male Date of Birth: [birth year] Admit Date: 07/14/2022 Admitting Physician Jonah Blue, MD PCP:Pcp, No  Brief Summary: Patient is a 72 y.o.  male with history of ICH with right residual hemiparesis, seizure disorder, HTN-who presented with breakthrough seizure.  Significant events: 5/9>> admit to [facility]  Significant studies: 5/9>> CT head: No acute finding 5/9>> CXR: No PNA 5/9>> CT chest/abdomen/pelvis: Stable right sided pleural thickening-no consolidation/pneumothorax-large left inguinal hernia-enlarged prostate.  Significant microbiology data: None  Procedures: None  Consults: Neurology  Subjective: No major issues overnight-anxious to go home.  Acknowledges drinking quite a bit of water over the past several days-"I thought it was good for me".  Discussed with nursing assistant-she apparently refilled his water pitcher several times yesterday.  Objective: Vitals: Blood pressure (!) 149/59, pulse 62, temperature 97.9 F (36.6 C), temperature source Oral, resp. rate 14, height 5\' 9"  (1.753 m), weight 77.1 kg, SpO2 95 %.   Exam: Gen Exam:Alert awake-not in any distress HEENT:atraumatic, normocephalic Chest: B/L clear to auscultation anteriorly CVS:S1S2 regular Abdomen:soft non tender, non distended Extremities:no edema Neurology: Right hemiparesis at baseline. Skin: no rash  Pertinent Labs/Radiology:    Latest Ref Rng & Units 07/15/2022    4:32 AM 07/14/2022    6:31 AM 07/14/2022    6:24 AM  CBC  WBC 4.0 - 10.5 K/uL 11.5   11.0   Hemoglobin 13.0 - 17.0 g/dL 21.3  08.6  57.8   Hematocrit 39.0 - 52.0 % 32.5  38.0  35.6   Platelets 150 - 400 K/uL 281   292     Lab Results  Component Value Date   NA 127 (L) 07/16/2022   K 3.9 07/16/2022   CL 98 07/16/2022   CO2 23 07/16/2022      Assessment/Plan: Breakthrough seizure Per neurology-provoked due to  hyponatremia Keppra increased to 1000 mg twice daily-no further seizures since hospitalization.  Hyponatremia Initially thought to be due to HCTZ-however it appears that patient consumes quite a bit of excessive free water.  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  He remains euvolemic on exam Stop IVF-as sodium levels are essentially unchanged. Fluid restriction 1 dose of Lasix Repeat electrolytes tomorrow   PAF Maintaining sinus rhythm Continue amiodarone, diltiazem Deemed not to be a candidate for anticoagulation given history of ICH Telemetry monitoring  HLD Statin  HTN Controlled with Cardizem HCTZ/lisinopril on hold No Plans to resume HCTZ on discharge given hyponatremia-likely will benefit on being on a loop diuretic instead.  Right inguinal hernia Not obstructed/incarcerated. Outpatient follow-up with general surgery for elective repair  History of ICH with right residual hemiparesis  BMI: Estimated body mass index is 25.1 kg/m as calculated from the following:   Height as of this encounter: 5\' 9"  (1.753 m).   Weight as of this encounter: 77.1 kg.   Code status:   Code Status: Full Code   DVT Prophylaxis: enoxaparin (LOVENOX) injection 40 mg Start: 07/14/22 1200   Family Communication:  Sister-Brenda- [phone]-updated 5/11 Brother-Daryl-[phone]-updated 5/10  Disposition Plan: Status is: Observation The patient will require care spanning > 2 midnights and should be moved to inpatient because: Severity of illness-ongoing hyponatremia-no improvement-being switched to normal saline 75 cc  an hour-with plans to repeat electrolytes tomorrow.   Planned Discharge Destination:Home health   Diet: Diet Order             DIET DYS 3 Room service appropriate? Yes; Fluid consistency: Thin; Fluid restriction: 1200 mL Fluid  Diet effective now                      Antimicrobial agents: Anti-infectives (From admission, onward)    None        MEDICATIONS: Scheduled Meds:  amiodarone  200 mg Oral Daily   atorvastatin  10 mg Oral Daily   chlorhexidine gluconate (MEDLINE KIT)  15 mL Mouth Rinse BID   diltiazem  30 mg Per Tube QID   enoxaparin (LOVENOX) injection  40 mg Subcutaneous Daily   pantoprazole  40 mg Oral QHS   sodium chloride flush  3 mL Intravenous Q12H   tamsulosin  0.4 mg Oral Daily   thiamine  100 mg Oral Daily   Continuous Infusions:  levETIRAcetam 1,000 mg (07/16/22 0847)   PRN Meds:.acetaminophen **OR** acetaminophen, albuterol, hydrALAZINE, LORazepam, ondansetron **OR** ondansetron (ZOFRAN) IV, mouth rinse   I have personally reviewed following labs and imaging studies  LABORATORY DATA: CBC: Recent Labs  Lab 07/14/22 0624 07/14/22 0631 07/15/22 0432  WBC 11.0*  --  11.5*  NEUTROABS 7.7  --   --   HGB 12.6* 12.9* 11.6*  HCT 35.6* 38.0* 32.5*  MCV 88.1  --  87.8  PLT 292  --  281     Basic Metabolic Panel: Recent Labs  Lab 07/14/22 0624 07/14/22 0631 07/15/22 0432 07/16/22 0252  NA 126* 127* 126* 127*  K 3.6 3.7 4.1 3.9  CL 91* 91* 94* 98  CO2 24  --  24 23  GLUCOSE 105* 101* 86 85  BUN 10 11 9 9   CREATININE 1.25* 1.10 1.05 0.97  CALCIUM 8.8*  --  8.3* 8.0*  MG  --   --  2.1  --      GFR: Estimated Creatinine Clearance: 67.8 mL/min (by C-G formula based on SCr of 0.97 mg/dL).  Liver Function Tests: Recent Labs  Lab 07/14/22 0624  AST 29  ALT 42  ALKPHOS 79  BILITOT 0.7  PROT 6.7  ALBUMIN 3.6    No results for input(s): "LIPASE", "AMYLASE" in the last 168 hours. No results for input(s): "AMMONIA" in the last 168 hours.  Coagulation Profile: Recent Labs  Lab 07/14/22 0624  INR 1.1     Cardiac Enzymes: No results for input(s): "CKTOTAL", "CKMB", "CKMBINDEX", "TROPONINI" in the last 168 hours.  BNP (last 3 results) No results for input(s): "PROBNP" in the last 8760  hours.  Lipid Profile: No results for input(s): "CHOL", "HDL", "LDLCALC", "TRIG", "CHOLHDL", "LDLDIRECT" in the last 72 hours.  Thyroid Function Tests: Recent Labs    07/15/22 1350  TSH 1.188    Anemia Panel: No results for input(s): "VITAMINB12", "FOLATE", "FERRITIN", "TIBC", "IRON", "RETICCTPCT" in the last 72 hours.  Urine analysis:    Component Value Date/Time   COLORURINE YELLOW 07/15/2022 1259   APPEARANCEUR CLOUDY (A) 07/15/2022 1259   LABSPEC 1.018 07/15/2022 1259   PHURINE 7.0 07/15/2022 1259   GLUCOSEU NEGATIVE 07/15/2022 1259   HGBUR SMALL (A) 07/15/2022 1259   BILIRUBINUR NEGATIVE 07/15/2022 1259   KETONESUR 5 (A) 07/15/2022 1259   PROTEINUR NEGATIVE 07/15/2022 1259   NITRITE NEGATIVE 07/15/2022 1259   LEUKOCYTESUR LARGE (A) 07/15/2022 1259    Sepsis Labs: Lactic Acid,  Venous No results found for: "LATICACIDVEN"  MICROBIOLOGY: No results found for this or any previous visit (from the past 240 hour(s)).  RADIOLOGY STUDIES/RESULTS: CT CHEST ABDOMEN PELVIS W CONTRAST  Result Date: 07/14/2022 CLINICAL DATA:  Persistent cough. EXAM: CT CHEST, ABDOMEN, AND PELVIS WITH CONTRAST TECHNIQUE: Multidetector CT imaging of the chest, abdomen and pelvis was performed following the standard protocol during bolus administration of intravenous contrast. RADIATION DOSE REDUCTION: This exam was performed according to the departmental dose-optimization program which includes automated exposure control, adjustment of the mA and/or kV according to patient size and/or use of iterative reconstruction technique. CONTRAST:  75mL OMNIPAQUE IOHEXOL 350 MG/ML SOLN COMPARISON:  Chest x-ray earlier 07/14/2022. CT 06/14/2021. Older exams as well. FINDINGS: CT CHEST FINDINGS Cardiovascular: Patient is tilted in the scanner. Heart is nonenlarged. No pericardial effusion. Coronary artery calcifications are seen. The thoracic aorta has a normal course and caliber. Mediastinum/Nodes: No enlarged  mediastinal, hilar, or axillary lymph nodes. Thyroid gland, trachea, and esophagus demonstrate no significant findings.Patulous esophagous. Lungs/Pleura: Mild pleural thickening again seen along the right hemithorax, similar to previous. No pneumothorax. There is breathing motion. Basilar atelectasis or scar. No dominant lung mass. No left-sided effusion. Musculoskeletal: Cervical ribs identified, congenital variant. Scattered degenerative changes of the spine. Imaging was obtained with the patient's arms at the patient's side, streak artifact. CT ABDOMEN PELVIS FINDINGS Hepatobiliary: No focal liver abnormality is seen. No gallstones, gallbladder wall thickening, or biliary dilatation. Pancreas: Unremarkable. No pancreatic ductal dilatation or surrounding inflammatory changes. Spleen: Twos 1 Adrenals/Urinary Tract: Slight thickening of the adrenal glands, unchanged from previous. No enhancing renal mass or collecting system dilatation bladder is dilated with some dependent calcifications. There is a large posterior bladder diverticulum. Nodular prostate with some focal areas extending into the base of the bladder. The course of the left ureter somewhat atypical extending into a large left inguinal hernia before joining into the area of the bladder and UVJ. Stomach/Bowel: Large and small bowel are nondilated. Scattered stool. Sigmoid colon diverticula. There is large left inguinal hernia involving significant portion of the sigmoid colon. Fat also extends into the hernia sac. Again no signs of obstruction. The stomach is mildly distended with air and debris. Short-segment small bowel intussusception in the left midabdomen without wall thickening, inflammatory changes. Likely transient. This has not seen previously. Vascular/Lymphatic: Normal caliber aorta and IVC with atherosclerotic changes. No specific abnormal lymph node enlargement identified in the abdomen and pelvis. Reproductive: Enlarged heterogeneous  prostate with mass effect along the base of the bladder. Please correlate with BPH changes in the patient's PSA. Other: No free air or free fluid. Musculoskeletal: Curvature and degenerative changes along the spine. Degenerative changes of the pelvis. IMPRESSION: Stable right-sided pleural thickening. No consolidation, pneumothorax or effusion. Breathing motion. Large left inguinal hernia involving fat and a significant portion of the sigmoid colon without obstruction. This has a wide mouth hernia. Colonic diverticula. Enlarged prostate with mass effect along the base of the bladder. Please correlate with patient's PSA and signs of BPH. Associated large bladder diverticula. Breathing motion. Electronically Signed   By: Karen Kays M.D.   On: 07/14/2022 13:07     LOS: 1 day   Jeoffrey Massed, MD  [facility]    To contact the attending provider between 7A-7P or the covering provider during after hours 7P-7A, please log into the web site [email or web address] and access using universal [place] password for that web site. If you do not have the password, please call the  hospital operator.  07/16/2022, 9:56 AM

## 2022-07-16 NOTE — Progress Notes (Signed)
Speech Language Pathology Treatment: Dysphagia  Patient Details Name: Mihran Leiba MRN: [id number] DOB: 1950-04-25 Today's Date: 07/16/2022 Time: 4098-1191 SLP Time Calculation (min) (ACUTE ONLY): 14 min  Assessment / Plan / Recommendation Clinical Impression  SLP followed up for diet tolerance. Pt consuming breakfast meal, tolerating well. Pt consumed mechanical soft textures with mildly prolonged mastication (note edentulous status). No evidence of oral pocketing. Assessed with thin liquids via cup and straw. Pt appeared more coordinated with thins via cup. No overt coughing with thins via straw or cup however during straw use pt appeared like he needed to cough with shakiness of voice. No wetness appreciated. Continue mechanical soft textures and thin liquids with meds whole in puree. No further ST needs identified.    HPI HPI: Patient is a 72 y.o. male with PMH: HTN, prior ICH with resultant seizures, right hemiparesis, h/o dysphagia with h/o PEG placement (has since been removed at a different facility per family). He presented to the hospital on 07/14/22 with seizure at home. (lives with family) In ER, he had a second seizure. He arrived to ER as code stroke and was loaded with Keppra by neuro without improvement; resolved with Ativan but then reoccured. CT head negative for acute finding but did show remote hemorrhagic insult in left frontal lobe with encephalomalacia.      SLP Plan  Discharge SLP treatment due to (comment) (goals met)      Recommendations for follow up therapy are one component of a multi-disciplinary discharge planning process, led by the attending physician.  Recommendations may be updated based on patient status, additional functional criteria and insurance authorization.    Recommendations  Diet recommendations: Dysphagia 3 (mechanical soft);Thin liquid Liquids provided via: Cup Medication Administration: Whole meds with puree Supervision: Staff to assist with  self feeding Compensations: Small sips/bites;Minimize environmental distractions Postural Changes and/or Swallow Maneuvers: Upright 30-60 min after meal;Seated upright 90 degrees                  Oral care BID   None Dysphagia, unspecified (R13.10)     Discharge SLP treatment due to (comment) (goals met)     Ardyth Gal MA, CCC-SLP Acute Rehabilitation Services    07/16/2022, 8:52 AM

## 2022-07-16 NOTE — Progress Notes (Signed)
Pt refused use of Bipap. States he does not wear at home and has declined a need for one here as well.

## 2022-07-17 LAB — BASIC METABOLIC PANEL
Anion gap: 5 (ref 5–15)
BUN: 9 mg/dL (ref 8–23)
CO2: 25 mmol/L (ref 22–32)
Calcium: 8.2 mg/dL — ABNORMAL LOW (ref 8.9–10.3)
Chloride: 100 mmol/L (ref 98–111)
Creatinine, Ser: 1.13 mg/dL (ref 0.61–1.24)
GFR, Estimated: >60 mL/min (ref >60)
Glucose, Bld: 88 mg/dL (ref 70–99)
Potassium: 3.8 mmol/L (ref 3.5–5.1)
Sodium: 130 mmol/L — ABNORMAL LOW (ref 135–145)

## 2022-07-17 MED ORDER — LEVETIRACETAM 100 MG/ML PO SOLN: 1000.0000 mg | Freq: Two times a day (BID) | ORAL | 12 refills | Status: DC

## 2022-07-17 MED ORDER — LISINOPRIL 20 MG PO TABS: 20.0000 mg | ORAL_TABLET | Freq: Every day | ORAL | 1 refills | Status: AC

## 2022-07-17 MED ORDER — AMIODARONE HCL 200 MG PO TABS: 200.0000 mg | ORAL_TABLET | Freq: Every day | ORAL | Status: DC

## 2022-07-17 MED ORDER — DILTIAZEM HCL 30 MG PO TABS: 30.0000 mg | ORAL_TABLET | Freq: Four times a day (QID) | ORAL | Status: DC

## 2022-07-17 MED ORDER — TAMSULOSIN HCL 0.4 MG PO CAPS: 0.4000 mg | ORAL_CAPSULE | Freq: Every day | ORAL | 1 refills | Status: DC

## 2022-07-17 MED ORDER — FUROSEMIDE 20 MG PO TABS
20.0000 mg | ORAL_TABLET | Freq: Every day | ORAL | Status: DC
  Administered 2022-07-17: 20 mg via ORAL
  Filled 2022-07-17: qty 1

## 2022-07-17 MED ORDER — FUROSEMIDE 20 MG PO TABS: 20.0000 mg | ORAL_TABLET | Freq: Every day | ORAL | 1 refills | Status: DC

## 2022-07-17 NOTE — TOC Transition Note (Signed)
Transition of Care [facility]) - CM/SW Discharge Note   Patient Details  Name: Louis Moore MRN: [id number] Date of Birth: [birth year]  [facility] CM/SW Contact:  Lawerance Sabal, RN Phone Number: 07/17/2022, 9:45 AM   Clinical Narrative:     Sherron Monday w patient at bedside about DC plan. He is agreeable to [facility] services, no preference of provider. [facility] able to accept for [facility] on Monday.  LVM w patient's sister per his request, no call back.  Patient has Rollator (4 wheels);BSC/3in1;Wheelchair - manual at home. Does not qualify for larger/ wider WC.   No other TOC needs identified for DC    Final next level of care: Home w Home Health Services Barriers to Discharge: No Barriers Identified   Patient Goals and CMS Choice CMS Medicare.gov Compare Post Acute Care list provided to:: Patient Choice offered to / list presented to : Patient  Discharge Placement                         Discharge Plan and Services Additional resources added to the After Visit Summary for                  DME Arranged: N/A         HH Arranged: PT, OT HH Agency: [facility] Date [facility] Agency Contacted: 07/17/22 Time HH Agency Contacted: 0945 Representative spoke with at [facility] Agency: [facility]  Social Determinants of Health (SDOH) Interventions SDOH Screenings   Tobacco Use: Medium Risk (07/14/2022)     Readmission Risk Interventions     No data to display

## 2022-07-17 NOTE — Discharge Summary (Signed)
PATIENT DETAILS Name: Louis Moore Age: 72 y.o. Sex: male Date of Birth: December 28, 1950 MRN: [id number]. Admitting Physician: Jonah Blue, MD PCP:Pcp, No  Admit Date: 07/14/2022 Discharge date: 07/17/2022  Recommendations for Outpatient Follow-up:  Follow up with PCP in 1-2 weeks Please obtain CMP/CBC in one week Please ensure follow-up with neurology Please ensure follow-up with urology Avoid HCTZ in the future  Admitted From:  Home  Disposition: Home health   Discharge Condition: good  CODE STATUS:   Code Status: Full Code   Diet recommendation:  Diet Order             Diet - low sodium heart healthy           DIET DYS 3 Room service appropriate? Yes; Fluid consistency: Thin; Fluid restriction: 1200 mL Fluid  Diet effective now                    Brief Summary: Patient is a 72 y.o.  male with history of ICH with right residual hemiparesis, seizure disorder, HTN-who presented with breakthrough seizure.   Significant events: 5/9>> admit to [facility]   Significant studies: 5/9>> CT head: No acute finding 5/9>> CXR: No PNA 5/9>> CT chest/abdomen/pelvis: Stable right sided pleural thickening-no consolidation/pneumothorax-large left inguinal hernia-enlarged prostate.   Significant microbiology data: None   Procedures: None   Consults: Neurology  Brief Hospital Course: Breakthrough seizure Per neurology-provoked due to hyponatremia Keppra increased to 1000 mg twice daily-no further seizures since hospitalization.   Hyponatremia Initially thought to be due to HCTZ-however it appears that patient consumes quite a bit of excessive free water.  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  He remains euvolemic on exam He was given 1 dose of IV Lasix-started on fluid restriction-with significant improvement in sodium levels. We will continue low-dose Lasix on  discharge Counseled both patient/family members regarding importance of fluid restriction. Repeat electrolytes in 1 week.  PAF Maintaining sinus rhythm Continue amiodarone, diltiazem Deemed not to be a candidate for anticoagulation given history of ICH Telemetry monitoring   HLD Statin   HTN Controlled with Cardizem Given that he had hyponatremia-will stop HCTZ and substitute that with furosemide He will continue lisinopril He will follow-up with PCP for further continued care.   Right inguinal hernia Not obstructed/incarcerated. Outpatient follow-up with general surgery for elective repair  BPH Enlarged prostate on CT scan Started Flomax Outpatient follow-up with urology   History of ICH with right residual hemiparesis   BMI: Estimated body mass index is 25.1 kg/m as calculated from the following:   Height as of this encounter: 5\' 9"  (1.753 m).   Weight as of this encounter: 77.1 kg.    Discharge Diagnoses:  Principal Problem:   Intractable seizures (HCC) Active Problems:   History of intracranial hemorrhage   Paroxysmal atrial fibrillation (HCC)   Subcutaneous crepitus   Essential hypertension   Cough   Discharge Instructions:  Activity:  As tolerated with Full fall precautions use walker/cane & assistance as needed  Discharge Instructions     Ambulatory referral to Neurology   Complete by: As directed    An appointment is requested in approximately: 4 weeks   Call MD for:  extreme fatigue   Complete by: As directed    Call MD for:  persistant dizziness or light-headedness   Complete by: As directed    Diet - low sodium heart healthy   Complete  by: As directed    Discharge instructions   Complete by: As directed    Follow with Primary MD in 1-2 weeks  Follow-up with neurology-their office will give you a call for a follow-up appointment  Please ask your primary care practitioner for a referral to urology  Your dose of Keppra was increased to  1000 mg twice daily  Your blood pressure medications were changed-you will now be on lisinopril at your prior dose, and he will stop taking HCTZ-this has been substituted for Lasix.  Maintain fluid restriction-1-1.5 L a day.    Please get a complete blood count and chemistry panel checked by your Primary MD at your next visit, and again as instructed by your Primary MD.  Get Medicines reviewed and adjusted: Please take all your medications with you for your next visit with your Primary MD  Laboratory/radiological data: Please request your Primary MD to go over all hospital tests and procedure/radiological results at the follow up, please ask your Primary MD to get all Hospital records sent to his/her office.  In some cases, they will be blood work, cultures and biopsy results pending at the time of your discharge. Please request that your primary care M.D. follows up on these results.  Also Note the following: If you experience worsening of your admission symptoms, develop shortness of breath, life threatening emergency, suicidal or homicidal thoughts you must seek medical attention immediately by calling 911 or calling your MD immediately  if symptoms less severe.  You must read complete instructions/literature along with all the possible adverse reactions/side effects for all the Medicines you take and that have been prescribed to you. Take any new Medicines after you have completely understood and accpet all the possible adverse reactions/side effects.   Do not drive when taking Pain medications or sleeping medications (Benzodaizepines)  Do not take more than prescribed Pain, Sleep and Anxiety Medications. It is not advisable to combine anxiety,sleep and pain medications without talking with your primary care practitioner  Special Instructions: If you have smoked or chewed Tobacco  in the last 2 yrs please stop smoking, stop any regular Alcohol  and or any Recreational drug use.  Wear  Seat belts while driving.  Please note: You were cared for by a hospitalist during your hospital stay. Once you are discharged, your primary care physician will handle any further medical issues. Please note that NO REFILLS for any discharge medications will be authorized once you are discharged, as it is imperative that you return to your primary care physician (or establish a relationship with a primary care physician if you do not have one) for your post hospital discharge needs so that they can reassess your need for medications and monitor your lab values.     Seizure precautions: Per [facility] statutes, patients with seizures are not allowed to drive until they have been seizure-free for six months and cleared by a physician    Use caution when using heavy equipment or power tools. Avoid working on ladders or at heights. Take showers instead of baths. Ensure the water temperature is not too high on the home water heater. Do not go swimming alone. Do not lock yourself in a room alone (i.e. bathroom). When caring for infants or small children, sit down when holding, feeding, or changing them to minimize risk of injury to the child in the event you have a seizure. Maintain good sleep hygiene. Avoid alcohol.    If patient has another seizure, call  911 and bring them back to the ED if: A.  The seizure lasts longer than 5 minutes.      B.  The patient doesn't wake shortly after the seizure or has new problems such as difficulty seeing, speaking or moving following the seizure C.  The patient was injured during the seizure D.  The patient has a temperature over 102 F (39C) E.  The patient vomited during the seizure and now is having trouble breathing    During the Seizure   - First, ensure adequate ventilation and place patients on the floor on their left side  Loosen clothing around the neck and ensure the airway is patent. If the patient is clenching the teeth, do not force the mouth  open with any object as this can cause severe damage - Remove all items from the surrounding that can be hazardous. The patient may be oblivious to what's happening and may not even know what he or she is doing. If the patient is confused and wandering, either gently guide him/her away and block access to outside areas - Reassure the individual and be comforting - Call 911. In most cases, the seizure ends before EMS arrives. However, there are cases when seizures may last over 3 to 5 minutes. Or the individual may have developed breathing difficulties or severe injuries. If a pregnant patient or a person with diabetes develops a seizure, it is prudent to call an ambulance. - Finally, if the patient does not regain full consciousness, then call EMS. Most patients will remain confused for about 45 to 90 minutes after a seizure, so you must use judgment in calling for help. - Avoid restraints but make sure the patient is in a bed with padded side rails - Place the individual in a lateral position with the neck slightly flexed; this will help the saliva drain from the mouth and prevent the tongue from falling backward - Remove all nearby furniture and other hazards from the area - Provide verbal assurance as the individual is regaining consciousness - Provide the patient with privacy if possible - Call for help and start treatment as ordered by the caregiver    After the Seizure (Postictal Stage)   After a seizure, most patients experience confusion, fatigue, muscle pain and/or a headache. Thus, one should permit the individual to sleep. For the next few days, reassurance is essential. Being calm and helping reorient the person is also of importance.   Most seizures are painless and end spontaneously. Seizures are not harmful to others but can lead to complications such as stress on the lungs, brain and the heart. Individuals with prior lung problems may develop labored breathing and respiratory  distress.    Increase activity slowly   Complete by: As directed       Allergies as of 07/17/2022   No Known Allergies      Medication List     STOP taking these medications    lisinopril-hydrochlorothiazide 20-12.5 MG tablet Commonly known as: ZESTORETIC       TAKE these medications    amiodarone 200 MG tablet Commonly known as: PACERONE Take 1 tablet (200 mg total) by mouth daily. What changed:  how to take this Another medication with the same name was removed. Continue taking this medication, and follow the directions you see here.   atorvastatin 10 MG tablet Commonly known as: LIPITOR Take 1 tablet by mouth daily.   diltiazem 30 MG tablet Commonly known as: CARDIZEM Take 1  tablet (30 mg total) by mouth every 6 (six) hours. What changed: how to take this   furosemide 20 MG tablet Commonly known as: LASIX Take 1 tablet (20 mg total) by mouth daily.   levETIRAcetam 100 MG/ML solution Commonly known as: KEPPRA Take 10 mLs (1,000 mg total) by mouth 2 (two) times daily. What changed: how much to take   lisinopril 20 MG tablet Commonly known as: ZESTRIL Take 1 tablet (20 mg total) by mouth daily.   pantoprazole sodium 40 mg Commonly known as: PROTONIX Place 40 mg into feeding tube at bedtime.   tamsulosin 0.4 MG Caps capsule Commonly known as: FLOMAX Take 1 capsule (0.4 mg total) by mouth daily.   thiamine 100 MG tablet Commonly known as: VITAMIN B1 Place 1 tablet (100 mg total) into feeding tube daily.   Vitamin D (Ergocalciferol) 1.25 MG (50000 UNIT) Caps capsule Commonly known as: DRISDOL Take 50,000 Units by mouth once a week.        Follow-up Information     Primary care practitioner. Schedule an appointment as soon as possible for a visit in 1 week(s).          [facility] Follow up.   Specialty: Neurology Why: Office will call with date/time, If you dont hear from them,please give them a call Contact  information: [address] [place] [address] [phone]               No Known Allergies   Other Procedures/Studies: CT CHEST ABDOMEN PELVIS W CONTRAST  Result Date: 07/14/2022 CLINICAL DATA:  Persistent cough. EXAM: CT CHEST, ABDOMEN, AND PELVIS WITH CONTRAST TECHNIQUE: Multidetector CT imaging of the chest, abdomen and pelvis was performed following the standard protocol during bolus administration of intravenous contrast. RADIATION DOSE REDUCTION: This exam was performed according to the departmental dose-optimization program which includes automated exposure control, adjustment of the mA and/or kV according to patient size and/or use of iterative reconstruction technique. CONTRAST:  75mL OMNIPAQUE IOHEXOL 350 MG/ML SOLN COMPARISON:  Chest x-ray earlier 07/14/2022. CT 06/14/2021. Older exams as well. FINDINGS: CT CHEST FINDINGS Cardiovascular: Patient is tilted in the scanner. Heart is nonenlarged. No pericardial effusion. Coronary artery calcifications are seen. The thoracic aorta has a normal course and caliber. Mediastinum/Nodes: No enlarged mediastinal, hilar, or axillary lymph nodes. Thyroid gland, trachea, and esophagus demonstrate no significant findings.Patulous esophagous. Lungs/Pleura: Mild pleural thickening again seen along the right hemithorax, similar to previous. No pneumothorax. There is breathing motion. Basilar atelectasis or scar. No dominant lung mass. No left-sided effusion. Musculoskeletal: Cervical ribs identified, congenital variant. Scattered degenerative changes of the spine. Imaging was obtained with the patient's arms at the patient's side, streak artifact. CT ABDOMEN PELVIS FINDINGS Hepatobiliary: No focal liver abnormality is seen. No gallstones, gallbladder wall thickening, or biliary dilatation. Pancreas: Unremarkable. No pancreatic ductal dilatation or surrounding inflammatory changes. Spleen: Twos 1 Adrenals/Urinary Tract: Slight  thickening of the adrenal glands, unchanged from previous. No enhancing renal mass or collecting system dilatation bladder is dilated with some dependent calcifications. There is a large posterior bladder diverticulum. Nodular prostate with some focal areas extending into the base of the bladder. The course of the left ureter somewhat atypical extending into a large left inguinal hernia before joining into the area of the bladder and UVJ. Stomach/Bowel: Large and small bowel are nondilated. Scattered stool. Sigmoid colon diverticula. There is large left inguinal hernia involving significant portion of the sigmoid colon. Fat also extends into  the hernia sac. Again no signs of obstruction. The stomach is mildly distended with air and debris. Short-segment small bowel intussusception in the left midabdomen without wall thickening, inflammatory changes. Likely transient. This has not seen previously. Vascular/Lymphatic: Normal caliber aorta and IVC with atherosclerotic changes. No specific abnormal lymph node enlargement identified in the abdomen and pelvis. Reproductive: Enlarged heterogeneous prostate with mass effect along the base of the bladder. Please correlate with BPH changes in the patient's PSA. Other: No free air or free fluid. Musculoskeletal: Curvature and degenerative changes along the spine. Degenerative changes of the pelvis. IMPRESSION: Stable right-sided pleural thickening. No consolidation, pneumothorax or effusion. Breathing motion. Large left inguinal hernia involving fat and a significant portion of the sigmoid colon without obstruction. This has a wide mouth hernia. Colonic diverticula. Enlarged prostate with mass effect along the base of the bladder. Please correlate with patient's PSA and signs of BPH. Associated large bladder diverticula. Breathing motion. Electronically Signed   By: Karen Kays M.D.   On: 07/14/2022 13:07   DG Chest Port 1 View  Result Date: 07/14/2022 CLINICAL DATA:   Cough.  Code stroke. EXAM: PORTABLE CHEST 1 VIEW COMPARISON:  06/09/2021. FINDINGS: Heart size and mediastinal contours are unremarkable. Lung volumes are low. Scar versus platelike atelectasis noted in the left base. No interstitial edema or airspace disease. IMPRESSION: 1. Low lung volumes. 2. Scar versus platelike atelectasis in the left base. Electronically Signed   By: Signa Kell M.D.   On: 07/14/2022 07:56   CT HEAD CODE STROKE WO CONTRAST  Result Date: 07/14/2022 CLINICAL DATA:  Code stroke.  Seizure with right-sided twitching EXAM: CT HEAD WITHOUT CONTRAST TECHNIQUE: Contiguous axial images were obtained from the base of the skull through the vertex without intravenous contrast. RADIATION DOSE REDUCTION: This exam was performed according to the departmental dose-optimization program which includes automated exposure control, adjustment of the mA and/or kV according to patient size and/or use of iterative reconstruction technique. COMPARISON:  12/01/2021 FINDINGS: Brain: Low density and volume loss the left frontal parietal junction with linear high-density attributed to mineralization, stable from 12/01/2021 and affecting cortex in this patient with history of seizure. Low-density attributed to wallerian degeneration descending the cortical spinal radiations on the left. No evidence of acute infarct, acute hemorrhage, hydrocephalus, or mass. Vascular: No hyperdense vessel or unexpected calcification. Skull: Normal. Negative for fracture or focal lesion. Sinuses/Orbits: No acute finding. Other: These results were communicated to Dr. Derry Lory at 6:38 am on 07/14/2022 by text page via the [facility] messaging system. ASPECTS [facility] Stroke Program Early CT Score) - Ganglionic level infarction (caudate, lentiform nuclei, internal capsule, insula, M1-M3 cortex): 7 - Supraganglionic infarction (M4-M6 cortex): 3 when accounting for the chronic encephalomalacia Total score (0-10 with 10 being normal): 10  IMPRESSION: 1. No acute finding. 2. Remote hemorrhagic insult in the left frontal lobe with encephalomalacia and mineralization affecting cortex. Electronically Signed   By: Tiburcio Pea M.D.   On: 07/14/2022 06:40     TODAY-DAY OF DISCHARGE:  Subjective:   Louis Moore today has no headache,no chest abdominal pain,no new weakness tingling or numbness, feels much better wants to go home today.   Objective:   Blood pressure (!) 143/57, pulse 62, temperature 98.4 F (36.9 C), temperature source Oral, resp. rate 15, height 5\' 9"  (1.753 m), weight 77.1 kg, SpO2 95 %.  Intake/Output Summary (Last 24 hours) at 07/17/2022 0842 Last data filed at 07/16/2022 1800 Gross per 24 hour  Intake 690 ml  Output  1600 ml  Net -910 ml   Filed Weights   07/14/22 0700 07/14/22 1144  Weight: 77.1 kg 77.1 kg    Exam: Awake Alert, Oriented *3, No new F.N deficits, Normal affect [place].AT,PERRAL Supple Neck,No JVD, No cervical lymphadenopathy appriciated.  Symmetrical Chest wall movement, Good air movement bilaterally, CTAB RRR,No Gallops,Rubs or new Murmurs, No Parasternal Heave +ve B.Sounds, Abd Soft, Non tender, No organomegaly appriciated, No rebound -guarding or rigidity. No Cyanosis, Clubbing or edema, No new Rash or bruise   PERTINENT RADIOLOGIC STUDIES: No results found.   PERTINENT LAB RESULTS: CBC: Recent Labs    07/15/22 0432  WBC 11.5*  HGB 11.6*  HCT 32.5*  PLT 281   CMET CMP     Component Value Date/Time   NA 130 (L) 07/17/2022 0332   K 3.8 07/17/2022 0332   CL 100 07/17/2022 0332   CO2 25 07/17/2022 0332   GLUCOSE 88 07/17/2022 0332   BUN 9 07/17/2022 0332   CREATININE 1.13 07/17/2022 0332   CALCIUM 8.2 (L) 07/17/2022 0332   PROT 6.7 07/14/2022 0624   ALBUMIN 3.6 07/14/2022 0624   AST 29 07/14/2022 0624   ALT 42 07/14/2022 0624   ALKPHOS 79 07/14/2022 0624   BILITOT 0.7 07/14/2022 0624   GFRNONAA >60 07/17/2022 0332    GFR Estimated Creatinine Clearance:  58.2 mL/min (by C-G formula based on SCr of 1.13 mg/dL). No results for input(s): "LIPASE", "AMYLASE" in the last 72 hours. No results for input(s): "CKTOTAL", "CKMB", "CKMBINDEX", "TROPONINI" in the last 72 hours. Invalid input(s): "POCBNP" No results for input(s): "DDIMER" in the last 72 hours. No results for input(s): "HGBA1C" in the last 72 hours. No results for input(s): "CHOL", "HDL", "LDLCALC", "TRIG", "CHOLHDL", "LDLDIRECT" in the last 72 hours. Recent Labs    07/15/22 1350  TSH 1.188   No results for input(s): "VITAMINB12", "FOLATE", "FERRITIN", "TIBC", "IRON", "RETICCTPCT" in the last 72 hours. Coags: No results for input(s): "INR" in the last 72 hours.  Invalid input(s): "PT" Microbiology: No results found for this or any previous visit (from the past 240 hour(s)).  FURTHER DISCHARGE INSTRUCTIONS:  Get Medicines reviewed and adjusted: Please take all your medications with you for your next visit with your Primary MD  Laboratory/radiological data: Please request your Primary MD to go over all hospital tests and procedure/radiological results at the follow up, please ask your Primary MD to get all Hospital records sent to his/her office.  In some cases, they will be blood work, cultures and biopsy results pending at the time of your discharge. Please request that your primary care M.D. goes through all the records of your hospital data and follows up on these results.  Also Note the following: If you experience worsening of your admission symptoms, develop shortness of breath, life threatening emergency, suicidal or homicidal thoughts you must seek medical attention immediately by calling 911 or calling your MD immediately  if symptoms less severe.  You must read complete instructions/literature along with all the possible adverse reactions/side effects for all the Medicines you take and that have been prescribed to you. Take any new Medicines after you have completely  understood and accpet all the possible adverse reactions/side effects.   Do not drive when taking Pain medications or sleeping medications (Benzodaizepines)  Do not take more than prescribed Pain, Sleep and Anxiety Medications. It is not advisable to combine anxiety,sleep and pain medications without talking with your primary care practitioner  Special Instructions: If you have smoked or  chewed Tobacco  in the last 2 yrs please stop smoking, stop any regular Alcohol  and or any Recreational drug use.  Wear Seat belts while driving.  Please note: You were cared for by a hospitalist during your hospital stay. Once you are discharged, your primary care physician will handle any further medical issues. Please note that NO REFILLS for any discharge medications will be authorized once you are discharged, as it is imperative that you return to your primary care physician (or establish a relationship with a primary care physician if you do not have one) for your post hospital discharge needs so that they can reassess your need for medications and monitor your lab values.  Total Time spent coordinating discharge including counseling, education and face to face time equals greater than 30 minutes.  SignedJeoffrey Massed 07/17/2022 8:42 AM

## 2022-07-17 NOTE — Plan of Care (Signed)

## 2022-10-06 ENCOUNTER — Ambulatory Visit: Payer: Medicare Other | Admitting: Neurology

## 2022-11-07 IMAGING — CT CT ABD-PELV W/O CM
2 of 4 series · 13 of 46 positions shown, 15 images · non-contrast
Comparison: None.
COMPARISON: None.

Addendum:
CLINICAL DATA: Evaluate for left groin hernia.



[Series 3: abd/ pelvis 5.0 i30f 2 · axial · 0.78mm/px · z∈[+726,+1201]mm · 10 of 115 slices shown, 12 images]
[im 10/115  soft-tissue]
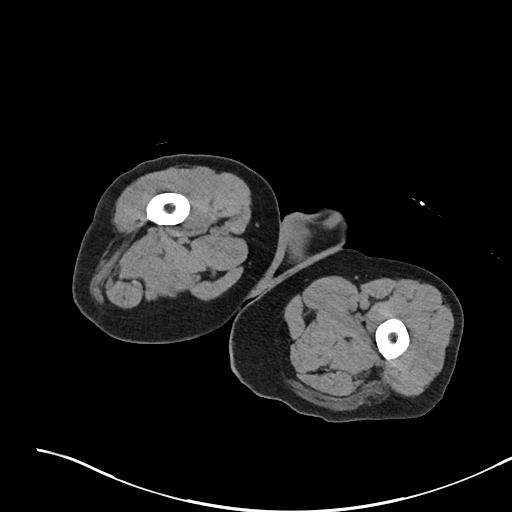
[im 10/115  bone]
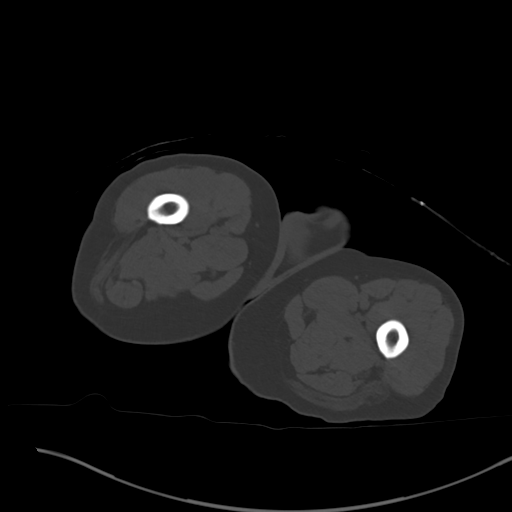
[im 19/115  soft-tissue]
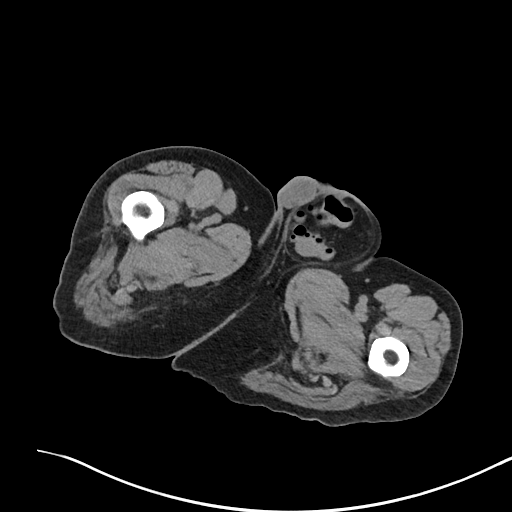
[im 32/115  soft-tissue]
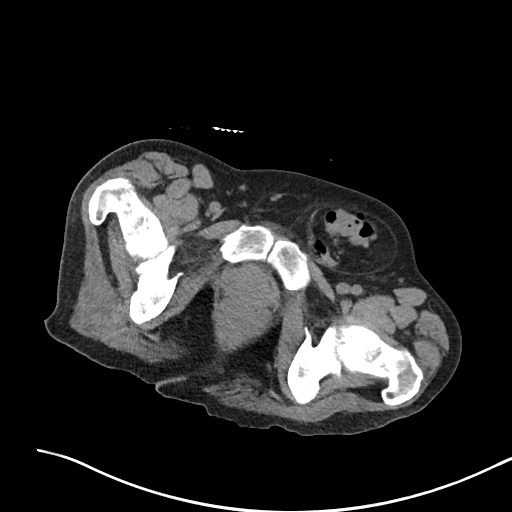
[im 42/115  soft-tissue]
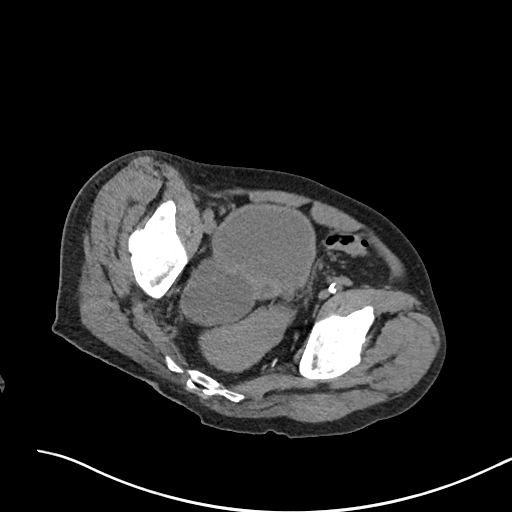
[im 51/115  soft-tissue]
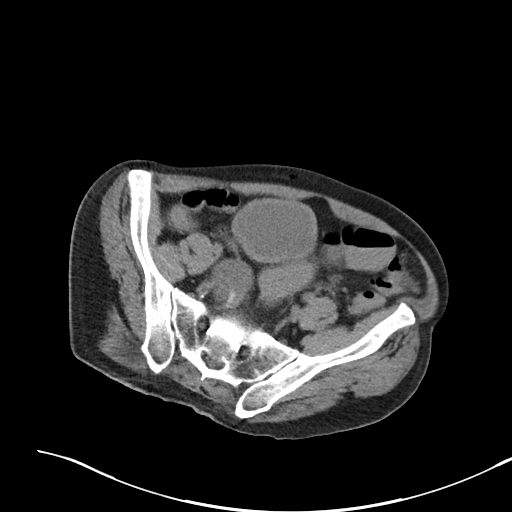
[im 64/115  soft-tissue]
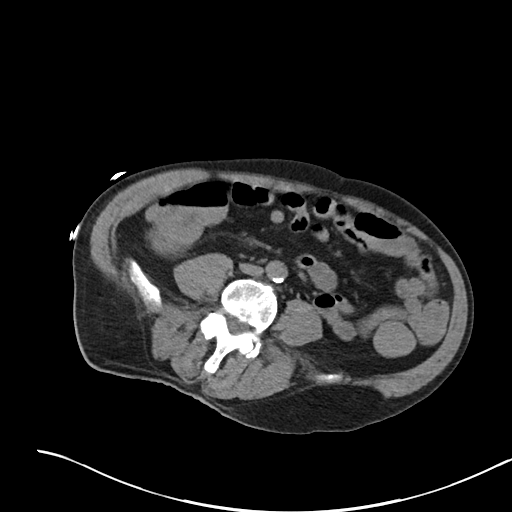
[im 73/115  soft-tissue]
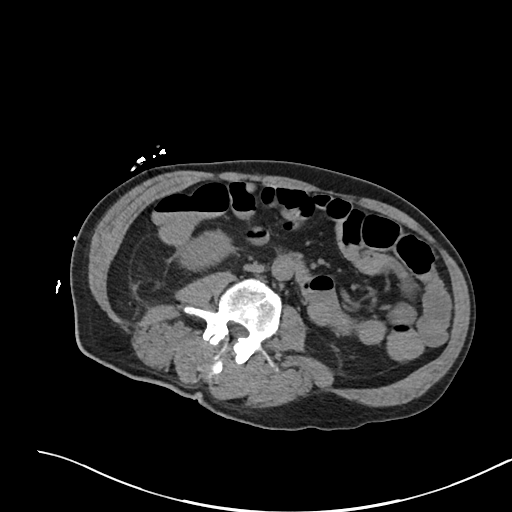
[im 87/115  soft-tissue]
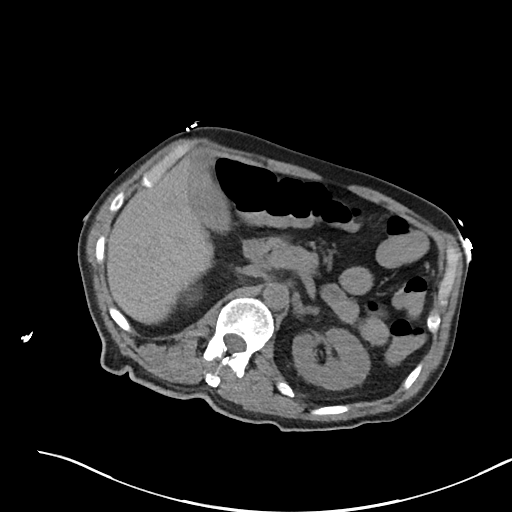
[im 96/115  soft-tissue]
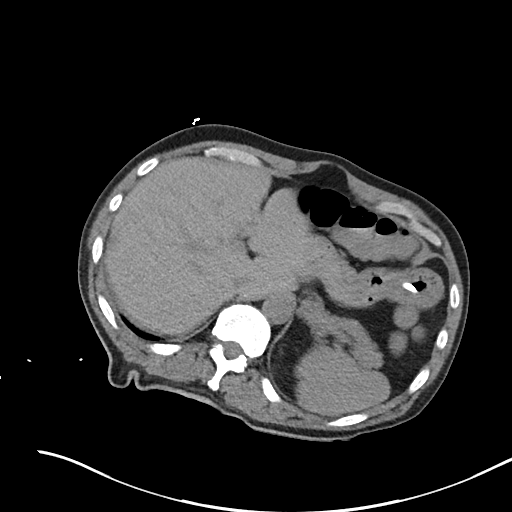
[im 96/115  bone]
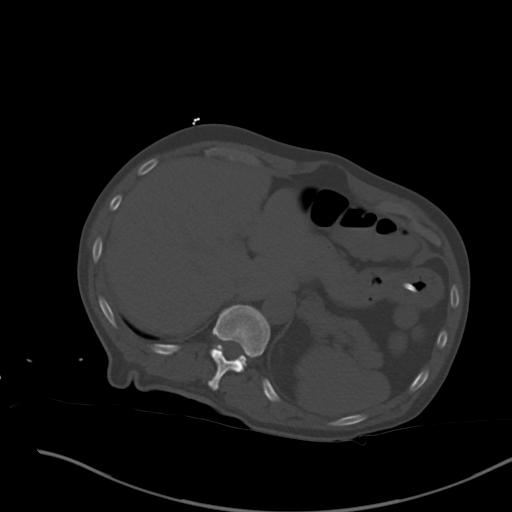
[im 105/115  soft-tissue]
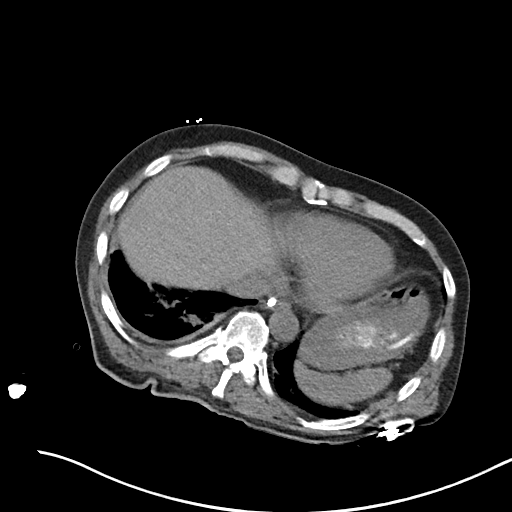

[Series 6: cor st · coronal · 0.73mm/px · 3 of 86 slices shown]
[im 29/86  soft-tissue]
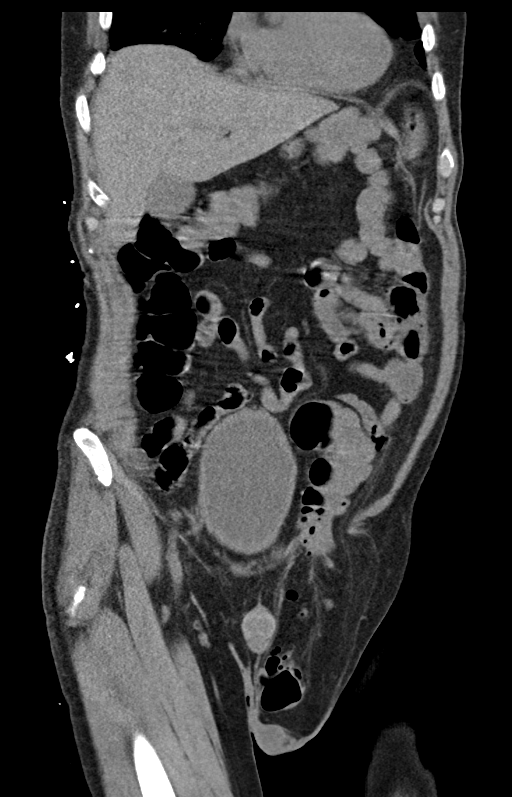
[im 38/86  soft-tissue]
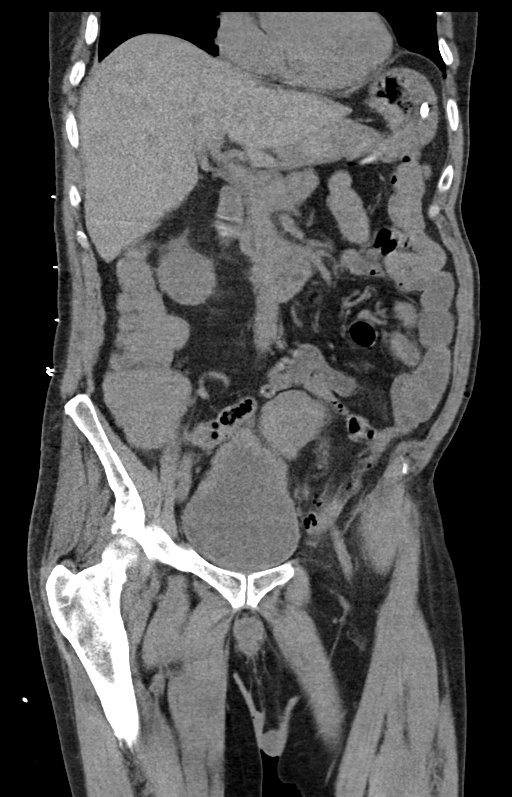
[im 48/86  soft-tissue]
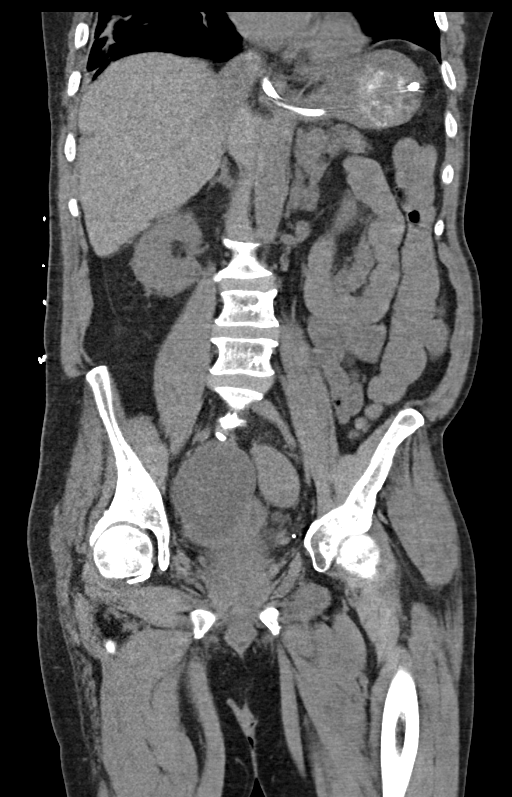

[13 of 46 positions shown; findings below may reference images not displayed]

FINDINGS: Lower chest: Mild to moderate severity atelectasis is seen within
the bilateral lung bases.

There is a small right pleural effusion.

Hepatobiliary: No focal liver abnormality is seen. No gallstones,
gallbladder wall thickening, or biliary dilatation.

Pancreas: Unremarkable. No pancreatic ductal dilatation or
surrounding inflammatory changes.

Spleen: Normal in size without focal abnormality.

Adrenals/Urinary Tract: Adrenal glands are unremarkable. Kidneys are
normal, without renal calculi, focal lesion, or hydronephrosis.
Mild, bilateral, nonspecific perinephric inflammatory fat stranding
is seen. A 6.2 cm x 5.9 cm urinary bladder diverticulum is seen
along the posterolateral aspect of a markedly distended urinary
bladder on the right.

Stomach/Bowel: A nasogastric tube is seen. Stomach is within normal
limits. Appendix appears normal. No evidence of bowel wall
thickening, distention, or inflammatory changes. Noninflamed
diverticula are seen throughout the sigmoid colon.

Vascular/Lymphatic: Aortic atherosclerosis. No enlarged abdominal or
pelvic lymph nodes.

Reproductive: Prostate is unremarkable.

Other: There is a 7.0 cm x 5.2 cm left inguinal hernia. This
contains fat and a large segment of proximal to mid sigmoid colon.

No abdominopelvic ascites.

Musculoskeletal: A small amount of soft tissue air seen within the
subcutaneous fat of the anterior pelvic wall on the right.

Multilevel degenerative changes seen throughout the lumbar spine.
IMPRESSION: 1. Large left inguinal hernia containing fat and a large segment of
proximal to mid sigmoid colon.
2. Mild to moderate severity bibasilar atelectasis with a small
right pleural effusion.
3. 6.2 cm x 5.9 cm urinary bladder diverticulum.
4. Sigmoid diverticulosis.
5. Aortic atherosclerosis.

Aortic Atherosclerosis (PX9KD-WI5.5).

ADDENDUM:
Results were discussed with the ordering physician at [DATE] a.m.
Eastern on December 15, 2021.

*** End of Addendum ***
FINDINGS: Lower chest: Mild to moderate severity atelectasis is seen within
the bilateral lung bases.

There is a small right pleural effusion.

Hepatobiliary: No focal liver abnormality is seen. No gallstones,
gallbladder wall thickening, or biliary dilatation.

Pancreas: Unremarkable. No pancreatic ductal dilatation or
surrounding inflammatory changes.

Spleen: Normal in size without focal abnormality.

Adrenals/Urinary Tract: Adrenal glands are unremarkable. Kidneys are
normal, without renal calculi, focal lesion, or hydronephrosis.
Mild, bilateral, nonspecific perinephric inflammatory fat stranding
is seen. A 6.2 cm x 5.9 cm urinary bladder diverticulum is seen
along the posterolateral aspect of a markedly distended urinary
bladder on the right.

Stomach/Bowel: A nasogastric tube is seen. Stomach is within normal
limits. Appendix appears normal. No evidence of bowel wall
thickening, distention, or inflammatory changes. Noninflamed
diverticula are seen throughout the sigmoid colon.

Vascular/Lymphatic: Aortic atherosclerosis. No enlarged abdominal or
pelvic lymph nodes.

Reproductive: Prostate is unremarkable.

Other: There is a 7.0 cm x 5.2 cm left inguinal hernia. This
contains fat and a large segment of proximal to mid sigmoid colon.

No abdominopelvic ascites.

Musculoskeletal: A small amount of soft tissue air seen within the
subcutaneous fat of the anterior pelvic wall on the right.

Multilevel degenerative changes seen throughout the lumbar spine.
IMPRESSION: 1. Large left inguinal hernia containing fat and a large segment of
proximal to mid sigmoid colon.
2. Mild to moderate severity bibasilar atelectasis with a small
right pleural effusion.
3. 6.2 cm x 5.9 cm urinary bladder diverticulum.
4. Sigmoid diverticulosis.
5. Aortic atherosclerosis.

Aortic Atherosclerosis ([id number]).

## 2023-02-13 ENCOUNTER — Telehealth: Payer: Self-pay | Admitting: Neurology

## 2023-02-13 NOTE — Telephone Encounter (Signed)
Pt's sister calling to reschedule new patient appt. Rescheduled to 05/11/2023 at 1:15pm.

## 2023-05-11 ENCOUNTER — Ambulatory Visit: Payer: Medicare Other | Admitting: Neurology

## 2023-05-11 ENCOUNTER — Encounter: Payer: Self-pay | Admitting: Neurology

## 2023-05-11 VITALS — BP 138/86 | HR 92 | Ht 68.0 in | Wt 169.0 lb

## 2023-05-11 DIAGNOSIS — Z8679 Personal history of other diseases of the circulatory system: Secondary | ICD-10-CM

## 2023-05-11 DIAGNOSIS — Z5181 Encounter for therapeutic drug level monitoring: Secondary | ICD-10-CM | POA: Diagnosis not present

## 2023-05-11 DIAGNOSIS — G40909 Epilepsy, unspecified, not intractable, without status epilepticus: Secondary | ICD-10-CM

## 2023-05-11 DIAGNOSIS — I69351 Hemiplegia and hemiparesis following cerebral infarction affecting right dominant side: Secondary | ICD-10-CM | POA: Diagnosis not present

## 2023-05-11 MED ORDER — GABAPENTIN 300 MG PO CAPS: 300.0000 mg | ORAL_CAPSULE | Freq: Two times a day (BID) | ORAL | 3 refills | Status: DC

## 2023-05-11 MED ORDER — LEVETIRACETAM 100 MG/ML PO SOLN: ORAL | 12 refills | Status: DC

## 2023-05-11 NOTE — Progress Notes (Signed)
 [facility]  PATIENT: Louis Moore DOB: 1950/04/27  REQUESTING CLINICIAN: Ghimire, Werner Lean, MD HISTORY FROM: Patient and family  REASON FOR VISIT: Seizure    HISTORICAL  CHIEF COMPLAINT:  Chief Complaint  Patient presents with   New Patient (Initial Visit)    Pt in 12, here with sister Steward Drone, sister Olegario Messier and Darryl brother Pt is referred for breakthrough seizures.     HISTORY OF PRESENT ILLNESS:  This is a 73 year old gentleman past medical history intraparenchymal hemorrhage, complicated by seizures and right side hemiplegia, hypertension, hyperlipidemia, anxiety who is presenting to establish care.  Patient tells me his first seizure occurred in April 2023 after his intracerebral hemorrhage.  At that time he was started on Keppra.  He continued to have breakthrough seizures that he described as right arm shaking with maintained awareness.  He was recently seen at [facility] in May 2024 for breakthrough seizure, at that time his Keppra was increased to 1000 mg twice daily.  Family tells me since then he had 1 additional seizure and was taken to [facility].  Keppra was not increased but the dose was switched to 750 in the morning and 1250 at night.  Since then he has not had an additional seizures.  Denies any injury with his seizures, denies any auras prior to the seizures.   Handedness: Left handed   Onset:06/2021  Seizure Type: Focal aware seizure   Current frequency: 2 seizures since May 2024  Any injuries from seizures: Denies   Seizure risk factors: ICH, Stroke   Previous ASMs: Levetiracetam   Currenty ASMs: Levetiracetam 750 mg in the morning and 1250 mg in the evening   ASMs side effects: Denies   Brain Images: Encephalomalacia in the left frontal lobe  Previous EEGs: not available for review    OTHER MEDICAL CONDITIONS: Hypertension, hyperlipidemia, anxiety, intraparenchymal hemorrhage, seizures  REVIEW OF SYSTEMS:  Full 14 system review of systems performed and negative with exception of: As noted in the HPI  ALLERGIES: No Known Allergies  HOME MEDICATIONS: Outpatient Medications Prior to Visit  Medication Sig Dispense Refill   amiodarone (PACERONE) 200 MG tablet Take 1 tablet (200 mg total) by mouth daily.     atorvastatin (LIPITOR) 10 MG tablet Take 1 tablet by mouth daily.     busPIRone (BUSPAR) 10 MG tablet Take 10 mg by mouth 3 (three) times daily.     diltiazem (CARDIZEM) 30 MG tablet Take 1 tablet (30 mg total) by mouth every 6 (six) hours.     furosemide (LASIX) 20 MG tablet Take 1 tablet (20 mg total) by mouth daily. 30 tablet 1   lisinopril (ZESTRIL) 20 MG tablet Take 1 tablet (20 mg total) by mouth daily. 30 tablet 1   ondansetron (ZOFRAN) 4 MG tablet Take by mouth.     pantoprazole sodium (PROTONIX) 40 mg Place 40 mg into feeding tube at bedtime.     tamsulosin (FLOMAX) 0.4 MG CAPS capsule Take 1 capsule (0.4 mg total) by mouth daily. 30 capsule 1   thiamine 100 MG tablet Place 1 tablet (100 mg total) into feeding tube daily.     Vitamin D, Ergocalciferol, (DRISDOL) 1.25 MG (50000 UNIT) CAPS capsule Take 50,000 Units by mouth once a week.     levETIRAcetam (KEPPRA) 100 MG/ML solution Take 10 mLs (1,000 mg total) by mouth 2 (two) times daily. 473 mL 12   No facility-administered medications prior to visit.    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 No pertinent family history.  SOCIAL HISTORY: Social History   Socioeconomic History   Marital status: Single    Spouse name: Not on file   Number of children: Not on file   Years of education: Not on file   Highest education level: Not on file  Occupational History   Occupation: retired  Tobacco Use   Smoking status: Former    Current packs/day: 0.00    Average packs/day: 0.5 packs/day for 55.0 years (27.5 ttl pk-yrs)    Types: Cigarettes    Start date: 11    Quit date: 2023    Years since quitting: 2.1   Smokeless tobacco: Never  Vaping Use   Vaping status: Never Used  Substance and Sexual Activity   Alcohol use: Never   Drug use: Never   Sexual activity: Not on file  Other Topics Concern   Not on file  Social History Narrative   Not on file   Social Drivers of Health   Financial Resource Strain: Not at Risk (06/06/2022)   Received from [place], [employer]    Financial Resource Strain: 1  Food Insecurity: Not at Risk (06/06/2022)   Received from [place], [place]   Food Insecurity    Food: 1  Transportation Needs: Not on File (10/19/2021)   Received from [employer] Needs    Transportation: 0  Physical Activity: At Risk (06/06/2022)   Received from [place], [place]   Physical Activity    Physical Activity: 2  Stress: Not at Risk (06/06/2022)   Received from [place], [place]   Stress    Stress: 1  Social Connections: Not at Risk (06/06/2022)   Received from [employer]   Social Connections    Connectedness: 1  Intimate Partner Violence: Not on file    PHYSICAL EXAM  GENERAL EXAM/CONSTITUTIONAL: Vitals:  Vitals:   05/11/23 1322  BP: 138/86  Pulse: 92  Weight: 169 lb (76.7 kg)  Height: 5\' 8"  (1.727 m)   Body mass index is 25.7 kg/m. Wt Readings from Last 3 Encounters:  05/11/23 169 lb (76.7 kg)   07/14/22 169 lb 15.6 oz (77.1 kg)  06/18/21 138 lb 0.1 oz (62.6 kg)   Patient is in no distress; well developed, nourished and groomed; neck is supple  MUSCULOSKELETAL: Gait, strength, tone, movements noted in Neurologic exam below  NEUROLOGIC: MENTAL STATUS:      No data to display         awake, alert, oriented to person, place and time recent and remote memory intact normal attention and concentration language fluent, comprehension intact, naming intact fund of knowledge appropriate  CRANIAL NERVE:  2nd, 3rd, 4th, 6th - Visual fields full to confrontation, extraocular muscles intact, no nystagmus 5th - facial sensation symmetric  7th - facial strength symmetric 8th - hearing intact 9th - palate elevates symmetrically, uvula midline 11th - shoulder shrug symmetric 12th - tongue protrusion midline  MOTOR:  normal bulk and tone, and full strength in the LUE and LLE. Right sided hemiplegia  SENSORY:  Decrease sensation to light touch in the RUE when compared to the left   COORDINATION:  finger-nose-finger, fine finger movements normal  GAIT/STATION:  Deferred using a wheelchair    DIAGNOSTIC DATA (LABS, IMAGING, TESTING) - I reviewed patient records, labs, notes, testing and imaging myself where available.  Lab Results  Component Value Date   WBC 11.5 (H) 07/15/2022   HGB 11.6 (L) 07/15/2022   HCT 32.5 (L) 07/15/2022   MCV 87.8 07/15/2022   PLT 281 07/15/2022      Component Value Date/Time   NA 130 (L) 07/17/2022 0332   K 3.8 07/17/2022 0332   CL 100 07/17/2022 0332   CO2 25 07/17/2022 0332   GLUCOSE 88 07/17/2022 0332   BUN 9 07/17/2022 0332   CREATININE 1.13 07/17/2022 0332   CALCIUM 8.2 (L) 07/17/2022 0332   PROT 6.7 07/14/2022 0624   ALBUMIN 3.6 07/14/2022 0624   AST 29 07/14/2022 0624   ALT 42 07/14/2022 0624   ALKPHOS 79 07/14/2022 0624   BILITOT 0.7 07/14/2022 0624   GFRNONAA >60 07/17/2022 0332   Lab Results  Component Value Date    CHOL 165 06/04/2021   HDL 38 (L) 06/04/2021   LDLCALC 118 (H) 06/04/2021   TRIG 46 06/04/2021   Lab Results  Component Value Date   HGBA1C 5.6 06/04/2021   No results found for: "VITAMINB12" Lab Results  Component Value Date   TSH 1.188 07/15/2022    CT Head 07/14/2022 1. No acute finding. 2. Remote hemorrhagic insult in the left frontal lobe with encephalomalacia and mineralization affecting cortex.   ASSESSMENT AND PLAN  73 y.o. year old male  with history of intraparenchymal hemorrhage, complicated by right-sided hemiplegia, seizure disorder, hypertension, hyperlipidemia, anxiety who is presenting to establish care.  They report additional seizures with a total daily dose of Keppra of 2000 mg daily, plan will be to continue with Keppra 750 in the morning and 1250 in the evening and add gabapentin 300 mg twice daily.  The gabapentin may also help with his anxiety feeling and sleep problem.  Advised patient to contact me if he does have any additional seizure or any additional concerns.  Will check a Keppra level, ordered refill of the medications.  I will see him in 6 months for follow-up or sooner if worse.   1. Seizure disorder (HCC)   2. History of intracranial hemorrhage   3. 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 Therapeutic drug monitoring     Patient Instructions  Continue current medication including Levetiracetam 750/1250 mg Start Gabapentin 300 mg twice daily Will obtain a Keppra level today Continue to follow with PCP Return in 6 months or sooner if worse   Per [facility] statutes, patients with seizures are not allowed to drive until they have been seizure-free for six months.  Other recommendations include using caution when using heavy equipment or power tools. Avoid working on ladders or at heights. Take showers instead of baths.  Do not swim alone.  Ensure the water temperature is not too high on the home  water heater. Do not go swimming alone. Do not lock yourself in a room alone (i.e. bathroom).  When caring for infants or small children, sit down when holding, feeding, or changing them to minimize risk of injury to the child in the event you have a seizure. Maintain good sleep hygiene. Avoid alcohol.  Also recommend adequate sleep, hydration, good diet and minimize stress.   During the Seizure  - First, ensure adequate ventilation and place patients on the floor on their left side  Loosen clothing around the neck and ensure the airway is patent. If the patient is clenching the teeth, do not force the mouth open with any object as this can cause severe damage - Remove all items from the surrounding that can be hazardous. The patient may be oblivious to what's happening and may not even know what he or she is doing. If the patient is confused and wandering, either gently guide him/her away and block access to outside areas - Reassure the individual and be comforting - Call 911. In most cases, the seizure ends before EMS arrives. However, there are cases when seizures may last over 3 to 5 minutes. Or the individual may have developed breathing difficulties or severe injuries. If a pregnant patient or a person with diabetes develops a seizure, it is prudent to call an ambulance. - Finally, if the patient does not regain full consciousness, then call EMS. Most patients will remain confused for about 45 to 90 minutes after a seizure, so you must use judgment in calling for help. - Avoid restraints but make sure the patient is in a bed with padded side rails - Place the individual in a lateral position with the neck slightly flexed; this will help the saliva drain from the mouth and prevent the tongue from falling backward - Remove all nearby furniture and other hazards from the area - Provide verbal assurance as the individual is regaining consciousness - Provide the patient with privacy if possible -  Call for help and start treatment as ordered by the caregiver   After the Seizure (Postictal Stage)  After a seizure, most patients experience confusion, fatigue, muscle pain and/or a headache. Thus, one should permit the individual to sleep. For the next few days, reassurance is essential. Being calm and helping reorient the person is also of importance.  Most seizures are painless and end spontaneously. Seizures are not harmful to others but can lead to complications such as stress on the lungs, brain and the heart. Individuals with prior lung problems may develop labored breathing and respiratory distress.    Discussed Patients with epilepsy have a small risk of sudden unexpected death, a condition referred to as sudden unexpected death in epilepsy (SUDEP). SUDEP is defined specifically as the sudden, unexpected, witnessed or unwitnessed, nontraumatic and nondrowning death in patients with epilepsy with or without evidence for a seizure, and excluding documented status epilepticus, in which post mortem examination does not reveal a structural or toxicologic cause for death     Orders Placed This Encounter  Procedures   Levetiracetam level    Meds ordered this encounter  Medications   gabapentin (NEURONTIN) 300 MG capsule    Sig: Take 1 capsule (300 mg total) by mouth 2 (two) times daily.    Dispense:  180 capsule    Refill:  3   levETIRAcetam (KEPPRA) 100 MG/ML solution    Sig: Take 7.5 mLs (750 mg total) by mouth every morning AND 12.5 mLs (1,250 mg total) every evening.    Dispense:  473 mL    Refill:  12  Return in about 6 months (around 11/11/2023).    Windell Norfolk, MD 05/11/2023, 2:16 PM  [facility] [address] [place], [place] [address] [phone]

## 2023-05-11 NOTE — Patient Instructions (Signed)
 Continue current medication including Levetiracetam 750/1250 mg Start Gabapentin 300 mg twice daily Will obtain a Keppra level today Continue to follow with PCP Return in 6 months or sooner if worse

## 2023-05-12 LAB — LEVETIRACETAM LEVEL: Levetiracetam Lvl: 26.8 ug/mL (ref 10.0–40.0)

## 2023-05-15 ENCOUNTER — Telehealth: Payer: Self-pay | Admitting: Anesthesiology

## 2023-05-15 NOTE — Telephone Encounter (Signed)
 Called pt's sister Steward Drone (ok per [facility]) informed per Dr Teresa Coombs, that Keppra level was within normal limits. Pt's sister Steward Drone verbalized understanding. Had no questions at this time and was encouraged to call us if any questions arise.

## 2023-05-15 NOTE — Telephone Encounter (Signed)
-----   Message from [facility] sent at 05/15/2023 12:22 PM EDT ----- Please call and advise the patient that the recent Keppra level was within normal limits. No further action is required on these tests at this time. Please remind patient to keep any upcoming appointments or tests and to call us with any interim questions, concerns, problems or updates. Thanks,   Windell Norfolk, MD

## 2023-05-15 NOTE — Progress Notes (Signed)
Please call and advise the patient that the recent Keppra level was within normal limits. No further action is required on these tests at this time. Please remind patient to keep any upcoming appointments or tests and to call us with any interim questions, concerns, problems or updates. Thanks,   Tonye Tancredi, MD    

## 2023-06-01 LAB — COLOGUARD: COLOGUARD: POSITIVE — AB

## 2023-11-21 ENCOUNTER — Encounter: Payer: Self-pay | Admitting: Neurology

## 2023-11-21 ENCOUNTER — Ambulatory Visit (INDEPENDENT_AMBULATORY_CARE_PROVIDER_SITE_OTHER): Admitting: Neurology

## 2023-11-21 VITALS — BP 158/64 | HR 66 | Ht 68.0 in

## 2023-11-21 DIAGNOSIS — F419 Anxiety disorder, unspecified: Secondary | ICD-10-CM | POA: Diagnosis not present

## 2023-11-21 DIAGNOSIS — Z8679 Personal history of other diseases of the circulatory system: Secondary | ICD-10-CM

## 2023-11-21 DIAGNOSIS — G40909 Epilepsy, unspecified, not intractable, without status epilepticus: Secondary | ICD-10-CM | POA: Diagnosis not present

## 2023-11-21 DIAGNOSIS — I69351 Hemiplegia and hemiparesis following cerebral infarction affecting right dominant side: Secondary | ICD-10-CM

## 2023-11-21 NOTE — Progress Notes (Signed)
 [facility]  PATIENT: Louis Moore DOB: 1951-01-04  REQUESTING CLINICIAN: No ref. provider found HISTORY FROM: Patient and family  REASON FOR VISIT: Seizure    HISTORICAL  CHIEF COMPLAINT:  Chief Complaint  Patient presents with   Follow-up    Pt in room 12. 2 sisters and 1 brother in room. History of intracranial hemorrhage. Hemiparesis affecting right side as late effect of cerebrovascular accident       INTERVAL HISTORY 11/21/2023 Patient presents today for follow-up, he is accompanied by brother and 2 sisters.  Last visit was in March, since then he has not had any seizure or seizure like activity.  He is compliant with his medications including Keppra , Neurontin .  They tell me that his sleep is better since starting Neurontin .  He is able to get up from his wheelchair and make a couple steps.  Overall he is doing better.   HISTORY OF PRESENT ILLNESS:  This is a 73 year old gentleman past medical history intraparenchymal hemorrhage, complicated by seizures and right side hemiplegia, hypertension, hyperlipidemia, anxiety who is presenting to establish care.  Patient tells me his first seizure occurred in April 2023 after his intracerebral hemorrhage.  At that time he was started on Keppra .  He continued to have breakthrough seizures that he described as right arm shaking with maintained awareness.  He was recently seen at [facility] in May 2024 for breakthrough seizure, at that time his Keppra  was increased to 1000 mg twice daily.  Family tells me since then he had 1 additional seizure and was taken to [facility].  Keppra  was not increased but the dose was switched to 750 in the morning and 1250 at night.  Since then he has not had an additional seizures.  Denies any injury with his seizures, denies any auras prior to the seizures.   Handedness: Left handed   Onset:06/2021  Seizure Type: Focal aware seizure   Current frequency: 2 seizures  since May 2024  Any injuries from seizures: Denies   Seizure risk factors: ICH, Stroke   Previous ASMs: Levetiracetam    Currenty ASMs: Levetiracetam  750 mg in the morning and 1250 mg in the evening, Gabapentin   300 mg twice daily   ASMs side effects: Denies   Brain Images: Encephalomalacia in the left frontal lobe  Previous EEGs: not available for review    OTHER MEDICAL CONDITIONS: Hypertension, hyperlipidemia, anxiety, intraparenchymal hemorrhage, seizures  REVIEW OF SYSTEMS: Full 14 system review of systems performed and negative with exception of: As noted in the HPI  ALLERGIES: No Known Allergies  HOME MEDICATIONS: Outpatient Medications Prior to Visit  Medication Sig Dispense Refill   amiodarone  (PACERONE ) 200 MG tablet Take 1 tablet (200 mg total) by mouth daily.     atorvastatin  (LIPITOR) 10 MG tablet Take 1 tablet by mouth daily.     busPIRone (BUSPAR) 10 MG tablet Take 10 mg by mouth 3 (three) times daily.     diltiazem  (CARDIZEM ) 30 MG tablet Take 1 tablet (30 mg total) by mouth every 6 (six) hours.     furosemide  (LASIX ) 20 MG tablet Take 1 tablet (20 mg total) by mouth daily. 30 tablet 1   gabapentin  (NEURONTIN ) 300 MG capsule Take 1 capsule (300 mg total) by mouth 2 (two) times daily. 180 capsule 3   levETIRAcetam  (KEPPRA ) 100 MG/ML solution Take 7.5 mLs (750 mg total) by mouth every morning AND 12.5 mLs (1,250 mg total) every evening. 473 mL 12   lisinopril  (ZESTRIL ) 20  MG tablet Take 1 tablet (20 mg total) by mouth daily. 30 tablet 1   ondansetron  (ZOFRAN ) 4 MG tablet Take by mouth.     pantoprazole  sodium (PROTONIX ) 40 mg Place 40 mg into feeding tube at bedtime.     tamsulosin  (FLOMAX ) 0.4 MG CAPS capsule Take 1 capsule (0.4 mg total) by mouth daily. 30 capsule 1   thiamine  100 MG tablet Place 1 tablet (100 mg total) into feeding tube daily.     Vitamin D, Ergocalciferol, (DRISDOL) 1.25 MG (50000 UNIT) CAPS capsule Take 50,000 Units by mouth once a week.      No facility-administered medications prior to visit.    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 No pertinent family history.  SOCIAL HISTORY: Social History   Socioeconomic History   Marital status: Single    Spouse name: Not on file   Number of children: Not on file   Years of education: Not on file   Highest education level: Not on file  Occupational History   Occupation: retired  Tobacco Use   Smoking status: Former    Current packs/day: 0.00    Average packs/day: 0.5 packs/day for 55.0 years (27.5 ttl pk-yrs)    Types: Cigarettes    Start date: 48    Quit date: 2023    Years since quitting: 2.7   Smokeless tobacco: Never  Vaping Use   Vaping status: Never Used  Substance and Sexual Activity   Alcohol use: Never   Drug use: Never   Sexual activity: Not on file  Other Topics Concern   Not on file  Social History Narrative   Not on file   Social Drivers of Health   Financial Resource Strain: Not at Risk (06/06/2022)   Received from [employer]    Financial Resource Strain: 1  Food Insecurity: Not at Risk (06/06/2022)   Received from [facility] Insecurity    Food: 1  Transportation Needs: Not on File (10/19/2021)   Received from [employer] Needs     Transportation: 0  Physical Activity: At Risk (06/06/2022)   Received from [facility]   Physical Activity    Physical Activity: 2  Stress: Not at Risk (06/06/2022)   Received from [facility]   Stress    Stress: 1  Social Connections: Not at Risk (06/06/2022)   Received from [employer]   Social Connections    Connectedness: 1  Intimate Partner Violence: Not on file    PHYSICAL EXAM  GENERAL EXAM/CONSTITUTIONAL: Vitals:  Vitals:   11/21/23 1347 11/21/23 1348 11/21/23 1419  BP: (!) 202/74 (!) 202/76 (!) 158/64  Pulse: 66  66  Height: 5' 8 (1.727 m)     Body mass index is 25.7 kg/m. Wt Readings from Last  3 Encounters:  05/11/23 169 lb (76.7 kg)  07/14/22 169 lb 15.6 oz (77.1 kg)  06/18/21 138 lb 0.1 oz (62.6 kg)   Patient is in no distress; well developed, nourished and groomed; neck is supple  MUSCULOSKELETAL: Gait, strength, tone, movements noted in Neurologic exam below  NEUROLOGIC: MENTAL STATUS:      No data to display         awake, alert, oriented to person, place and time recent and remote memory intact normal attention and concentration language fluent, comprehension intact, naming intact fund of knowledge appropriate  CRANIAL NERVE:  2nd, 3rd, 4th, 6th - Visual fields full to confrontation, extraocular muscles intact, no nystagmus 5th - facial sensation symmetric 7th - facial strength symmetric 8th - hearing intact 9th - palate elevates symmetrically, uvula midline 11th - shoulder shrug symmetric 12th - tongue protrusion midline  MOTOR:  normal bulk and tone, and full strength in the LUE and LLE. Right sided hemiplegia  SENSORY:  Decrease sensation to light touch in the RUE when compared to the left   COORDINATION:  finger-nose-finger, fine finger movements normal on the left  GAIT/STATION:  Deferred using a wheelchair    DIAGNOSTIC DATA (LABS, IMAGING, TESTING) - I reviewed patient records, labs, notes, testing and imaging myself where available.  Lab  Results  Component Value Date   WBC 11.5 (H) 07/15/2022   HGB 11.6 (L) 07/15/2022   HCT 32.5 (L) 07/15/2022   MCV 87.8 07/15/2022   PLT 281 07/15/2022      Component Value Date/Time   NA 130 (L) 07/17/2022 0332   K 3.8 07/17/2022 0332   CL 100 07/17/2022 0332   CO2 25 07/17/2022 0332   GLUCOSE 88 07/17/2022 0332   BUN 9 07/17/2022 0332   CREATININE 1.13 07/17/2022 0332   CALCIUM  8.2 (L) 07/17/2022 0332   PROT 6.7 07/14/2022 0624   ALBUMIN 3.6 07/14/2022 0624   AST 29 07/14/2022 0624   ALT 42 07/14/2022 0624   ALKPHOS 79 07/14/2022 0624   BILITOT 0.7 07/14/2022 0624   GFRNONAA >60 07/17/2022 0332   Lab Results  Component Value Date   CHOL 165 06/04/2021   HDL 38 (L) 06/04/2021   LDLCALC 118 (H) 06/04/2021   TRIG 46 06/04/2021   Lab Results  Component Value Date   HGBA1C 5.6 06/04/2021   No results found for: VITAMINB12 Lab Results  Component Value Date   TSH 1.188 07/15/2022    CT Head 07/14/2022 1. No acute finding. 2. Remote hemorrhagic insult in the left frontal lobe with encephalomalacia and mineralization affecting cortex.   ASSESSMENT AND PLAN  73 y.o. year old male  with history of intraparenchymal hemorrhage, complicated by right-sided hemiplegia, seizure disorder, hypertension, hyperlipidemia, anxiety who is presenting for follow-up for his seizures.  He is doing well on Keppra  750/1250 mg and gabapentin  300 mg twice daily.  Denies any seizures, feels his sleep is better but his anxiety is still a problem.  They tell me that usually in the evening he will feel anxious to the point of feeling nauseous, PCP has given him ondansetron  which helps.  He is also on buspirone.  Plan will be for patient to continue current medications, I did advise family to videotape any events that looks suspicious for a seizure and contact us .  They voiced understanding.  I will see him in 6 months for follow-up or sooner if worse.    1. Seizure disorder (HCC)   2. History of  intracranial  hemorrhage   3. 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 Anxiety      Patient Instructions  Continue levetiracetam  750 mg in the morning and 1250 in the evening  Continue gabapentin  300 mg twice daily Continue your other medications including buspirone Please videotape any event that is suspicious for seizures Continue to follow with PCP regarding management of anxiety Return in 6 months or sooner if worse.    Per [place] statutes, patients with seizures are not allowed to drive until they have been seizure-free for six months.  Other recommendations include using caution when using heavy equipment or power tools. Avoid working on ladders or at heights. Take showers instead of baths.  Do not swim alone.  Ensure the water  temperature is not too high on the home water  heater. Do not go swimming alone. Do not lock yourself in a room alone (i.e. bathroom). When caring for infants or small children, sit down when holding, feeding, or changing them to minimize risk of injury to the child in the event you have a seizure. Maintain good sleep hygiene. Avoid alcohol.  Also recommend adequate sleep, hydration, good diet and minimize stress.   During the Seizure  - First, ensure adequate ventilation and place patients on the floor on their left side  Loosen clothing around the neck and ensure the airway is patent. If the patient is clenching the teeth, do not force the mouth open with any object as this can cause severe damage - Remove all items from the surrounding that can be hazardous. The patient may be oblivious to what's happening and may not even know what he or she is doing. If the patient is confused and wandering, either gently guide him/her away and block access to outside areas - Reassure the individual and be comforting - Call 911. In most cases, the seizure ends before EMS arrives. However, there are cases when seizures  may last over 3 to 5 minutes. Or the individual may have developed breathing difficulties or severe injuries. If a pregnant patient or a person with diabetes develops a seizure, it is prudent to call an ambulance. - Finally, if the patient does not regain full consciousness, then call EMS. Most patients will remain confused for about 45 to 90 minutes after a seizure, so you must use judgment in calling for help. - Avoid restraints but make sure the patient is in a bed with padded side rails - Place the individual in a lateral position with the neck slightly flexed; this will help the saliva drain from the mouth and prevent the tongue from falling backward - Remove all nearby furniture and other hazards from the area - Provide verbal assurance as the individual is regaining consciousness - Provide the patient with privacy if possible - Call for help and start treatment as ordered by the caregiver   After the Seizure (Postictal Stage)  After a seizure, most patients experience confusion, fatigue, muscle pain and/or a headache. Thus, one should permit the individual to sleep. For the next few days, reassurance is essential. Being calm and helping reorient the person is also of importance.  Most seizures are painless and end spontaneously. Seizures are not harmful to others but can lead to complications such as stress on the lungs, brain and the heart. Individuals with prior lung problems may develop labored breathing and respiratory distress.    Discussed Patients with epilepsy have a small risk of sudden unexpected death,  a condition referred to as sudden unexpected death in epilepsy (SUDEP). SUDEP is defined specifically as the sudden, unexpected, witnessed or unwitnessed, nontraumatic and nondrowning death in patients with epilepsy with or without evidence for a seizure, and excluding documented status epilepticus, in which post mortem examination does not reveal a structural or toxicologic cause for  death     No orders of the defined types were placed in this encounter.   No orders of the defined types were placed in this encounter.   Return in about 6 months (around 05/20/2024).    Pastor Falling, MD 11/21/2023, 5:21 PM  [facility] [address], [address] [place], [place] [address] [phone]

## 2023-11-21 NOTE — Patient Instructions (Signed)
 Continue levetiracetam  750 mg in the morning and 1250 in the evening  Continue gabapentin  300 mg twice daily Continue your other medications including buspirone Please videotape any event that is suspicious for seizures Continue to follow with PCP regarding management of anxiety Return in 6 months or sooner if worse.

## 2024-02-17 ENCOUNTER — Other Ambulatory Visit: Payer: Self-pay | Admitting: Neurology

## 2024-03-08 ENCOUNTER — Other Ambulatory Visit: Payer: Self-pay | Admitting: Neurology

## 2024-04-07 DEATH — deceased

## 2024-06-26 ENCOUNTER — Ambulatory Visit: Admitting: Neurology
